# Patient Record
Sex: Female | Born: 1955
Health system: Southern US, Community
[De-identification: ages and names within clinical notes are randomized; demographics above are authoritative.]

## PROBLEM LIST (undated history)

## (undated) DIAGNOSIS — R1310 Dysphagia, unspecified: Secondary | ICD-10-CM

## (undated) DIAGNOSIS — E039 Hypothyroidism, unspecified: Secondary | ICD-10-CM

## (undated) DIAGNOSIS — B450 Pulmonary cryptococcosis: Secondary | ICD-10-CM

## (undated) DIAGNOSIS — K5090 Crohn's disease, unspecified, without complications: Secondary | ICD-10-CM

## (undated) DIAGNOSIS — K219 Gastro-esophageal reflux disease without esophagitis: Secondary | ICD-10-CM

## (undated) DIAGNOSIS — M1990 Unspecified osteoarthritis, unspecified site: Secondary | ICD-10-CM

## (undated) DIAGNOSIS — F419 Anxiety disorder, unspecified: Secondary | ICD-10-CM

## (undated) HISTORY — PX: CHOLECYSTECTOMY: SHX55

## (undated) HISTORY — PX: SMALL INTESTINE SURGERY: SHX150

## (undated) HISTORY — DX: Anxiety disorder, unspecified: F41.9

## (undated) HISTORY — DX: Dysphagia, unspecified: R13.10

## (undated) HISTORY — PX: APPENDECTOMY: SHX54

## (undated) HISTORY — DX: Pulmonary cryptococcosis: B45.0

## (undated) HISTORY — DX: Hypothyroidism, unspecified: E03.9

## (undated) HISTORY — DX: Crohn's disease, unspecified, without complications: K50.90

---

## 1979-11-13 HISTORY — PX: PARTIAL COLECTOMY: SHX5273

## 1998-11-12 HISTORY — PX: BUNIONECTOMY: SHX129

## 2003-11-13 HISTORY — PX: BUNIONECTOMY: SHX129

## 2008-11-12 HISTORY — PX: COLONOSCOPY: SHX174

## 2008-11-12 HISTORY — PX: ESOPHAGOGASTRODUODENOSCOPY: SHX1529

## 2015-05-02 DIAGNOSIS — E539 Vitamin B deficiency, unspecified: Secondary | ICD-10-CM | POA: Diagnosis not present

## 2015-05-19 DIAGNOSIS — Z780 Asymptomatic menopausal state: Secondary | ICD-10-CM | POA: Diagnosis not present

## 2015-05-19 DIAGNOSIS — Z01419 Encounter for gynecological examination (general) (routine) without abnormal findings: Secondary | ICD-10-CM | POA: Diagnosis not present

## 2015-05-19 DIAGNOSIS — Z6827 Body mass index (BMI) 27.0-27.9, adult: Secondary | ICD-10-CM | POA: Diagnosis not present

## 2015-05-19 DIAGNOSIS — N952 Postmenopausal atrophic vaginitis: Secondary | ICD-10-CM | POA: Diagnosis not present

## 2015-05-19 DIAGNOSIS — N904 Leukoplakia of vulva: Secondary | ICD-10-CM | POA: Diagnosis not present

## 2015-06-02 DIAGNOSIS — E539 Vitamin B deficiency, unspecified: Secondary | ICD-10-CM | POA: Diagnosis not present

## 2015-06-28 DIAGNOSIS — E539 Vitamin B deficiency, unspecified: Secondary | ICD-10-CM | POA: Diagnosis not present

## 2015-07-29 DIAGNOSIS — E539 Vitamin B deficiency, unspecified: Secondary | ICD-10-CM | POA: Diagnosis not present

## 2015-07-29 DIAGNOSIS — Z23 Encounter for immunization: Secondary | ICD-10-CM | POA: Diagnosis not present

## 2015-08-03 DIAGNOSIS — B450 Pulmonary cryptococcosis: Secondary | ICD-10-CM | POA: Diagnosis not present

## 2015-08-03 DIAGNOSIS — K5000 Crohn's disease of small intestine without complications: Secondary | ICD-10-CM | POA: Diagnosis not present

## 2015-08-03 DIAGNOSIS — K50019 Crohn's disease of small intestine with unspecified complications: Secondary | ICD-10-CM | POA: Diagnosis not present

## 2015-08-03 DIAGNOSIS — R918 Other nonspecific abnormal finding of lung field: Secondary | ICD-10-CM | POA: Diagnosis not present

## 2015-08-03 DIAGNOSIS — K219 Gastro-esophageal reflux disease without esophagitis: Secondary | ICD-10-CM | POA: Diagnosis not present

## 2015-08-09 DIAGNOSIS — Z885 Allergy status to narcotic agent status: Secondary | ICD-10-CM | POA: Diagnosis not present

## 2015-08-09 DIAGNOSIS — Z882 Allergy status to sulfonamides status: Secondary | ICD-10-CM | POA: Diagnosis not present

## 2015-08-09 DIAGNOSIS — F410 Panic disorder [episodic paroxysmal anxiety] without agoraphobia: Secondary | ICD-10-CM | POA: Diagnosis not present

## 2015-08-09 DIAGNOSIS — Z888 Allergy status to other drugs, medicaments and biological substances status: Secondary | ICD-10-CM | POA: Diagnosis not present

## 2015-08-09 DIAGNOSIS — R000 Tachycardia, unspecified: Secondary | ICD-10-CM | POA: Diagnosis not present

## 2015-08-09 DIAGNOSIS — Z7952 Long term (current) use of systemic steroids: Secondary | ICD-10-CM | POA: Diagnosis not present

## 2015-08-09 DIAGNOSIS — F419 Anxiety disorder, unspecified: Secondary | ICD-10-CM | POA: Diagnosis not present

## 2015-08-09 DIAGNOSIS — F17201 Nicotine dependence, unspecified, in remission: Secondary | ICD-10-CM | POA: Diagnosis not present

## 2015-08-09 DIAGNOSIS — Z0189 Encounter for other specified special examinations: Secondary | ICD-10-CM | POA: Diagnosis not present

## 2015-08-09 DIAGNOSIS — R079 Chest pain, unspecified: Secondary | ICD-10-CM | POA: Diagnosis not present

## 2015-08-09 DIAGNOSIS — R002 Palpitations: Secondary | ICD-10-CM | POA: Diagnosis not present

## 2015-08-09 DIAGNOSIS — K5090 Crohn's disease, unspecified, without complications: Secondary | ICD-10-CM | POA: Diagnosis not present

## 2015-08-30 DIAGNOSIS — E539 Vitamin B deficiency, unspecified: Secondary | ICD-10-CM | POA: Diagnosis not present

## 2015-09-27 DIAGNOSIS — E539 Vitamin B deficiency, unspecified: Secondary | ICD-10-CM | POA: Diagnosis not present

## 2015-10-27 DIAGNOSIS — E539 Vitamin B deficiency, unspecified: Secondary | ICD-10-CM | POA: Diagnosis not present

## 2016-10-31 ENCOUNTER — Encounter: Payer: Self-pay | Admitting: Gastroenterology

## 2016-11-01 ENCOUNTER — Telehealth: Payer: Self-pay

## 2016-11-01 NOTE — Telephone Encounter (Signed)
Pt received a triage letter from DS. Pt is wanting to have colonoscopy next week since her husband (truck driver) will be home to take her. Pt also was referred by Dr Quintin Alto for reflux and crohn's and has OV with [place] on 1/11 at 230pm.  Patient said she wanted to speak with DS and would call back. [phone]

## 2016-11-02 NOTE — Telephone Encounter (Signed)
NOTE: [phone] is incorrect number.  I called pt at [phone]. She has been referred to [place] by Dr. Quintin Alto for gerd and crohn's.  She was already scheduled for 11/22/2016 and I told her it would be best to keep that appt since she was referred for problems.  She wanted to change the appt to an earlier am appt. She was rescheduled to 11/26/2016 at 8:30 Am with Neil Crouch, PA.

## 2016-11-22 ENCOUNTER — Ambulatory Visit: Payer: Self-pay | Admitting: Gastroenterology

## 2016-11-26 ENCOUNTER — Ambulatory Visit: Payer: Self-pay | Admitting: Gastroenterology

## 2016-12-14 ENCOUNTER — Ambulatory Visit: Payer: Self-pay | Admitting: Gastroenterology

## 2017-01-03 ENCOUNTER — Other Ambulatory Visit: Payer: Self-pay

## 2017-01-03 ENCOUNTER — Telehealth: Payer: Self-pay

## 2017-01-03 ENCOUNTER — Encounter: Payer: Self-pay | Admitting: Nurse Practitioner

## 2017-01-03 ENCOUNTER — Ambulatory Visit (INDEPENDENT_AMBULATORY_CARE_PROVIDER_SITE_OTHER): Payer: Medicare Other | Admitting: Nurse Practitioner

## 2017-01-03 VITALS — BP 129/77 | HR 87 | Temp 98.1°F | Ht 66.0 in | Wt 172.6 lb

## 2017-01-03 DIAGNOSIS — K50119 Crohn's disease of large intestine with unspecified complications: Secondary | ICD-10-CM | POA: Diagnosis not present

## 2017-01-03 DIAGNOSIS — B459 Cryptococcosis, unspecified: Secondary | ICD-10-CM

## 2017-01-03 DIAGNOSIS — K50019 Crohn's disease of small intestine with unspecified complications: Secondary | ICD-10-CM | POA: Insufficient documentation

## 2017-01-03 MED ORDER — PEG 3350-KCL-NA BICARB-NACL 420 G PO SOLR: 4000.0000 mL | ORAL | 0 refills | Status: DC

## 2017-01-03 NOTE — Telephone Encounter (Signed)
Spoke to [place] at Dr. Luan Pulling (pulmonologist) office. She advised to fax referral info to [phone]. Info faxed for dx: cryptococcosis.

## 2017-01-03 NOTE — Progress Notes (Signed)
cc'ed to pcp °

## 2017-01-03 NOTE — Patient Instructions (Signed)
No PA needed for TCS, Decision ID# [id number]

## 2017-01-03 NOTE — Patient Instructions (Signed)
1. Have your labs drawn when you're able to. 2. We will schedule your colonoscopy for you. 3. We will refer you to Dr. Luan Pulling who is a pulmonologist to evaluate your lung condition. 4. Return for follow-up in 6 months or per postprocedure recommendations.

## 2017-01-03 NOTE — Assessment & Plan Note (Signed)
The patient has a history of cryptococcosis which is being followed at [facility] in tandem with her Crohn's disease care on Imuran and prednisone. Last x-ray reviewed by pulmonary and found to be no sign of reactivation. At this point I will refer her to Dr. Luan Pulling was a pulmonologist for evaluation and recommendations/follow-up care related to this.

## 2017-01-03 NOTE — Progress Notes (Addendum)
REVIEWED-NO ADDITIONAL RECOMMENDATIONS.  Primary Care Physician:  Manon Hilding, MD Primary Gastroenterologist:  Dr. Oneida Alar  Chief Complaint  Patient presents with  . Crohn's Disease    HPI:   Isabella Scott is a 61 y.o. female who presents On referral from primary care for GERD and Crohn's disease history. Patient was going to see Dr. Britta Mccreedy but he has left his practice. It appears she is extensively seen [facility] for GI care including GERD and Crohn's disease. PCP notes reviewed including attached labs. CBC completed 07/03/2016 with normal hemoglobin, no leukocytosis, normal AST/ALT, alkaline phosphatase, bilirubin. Her PCP last colonoscopy 2014 at her visit on 09/04/2016 she requested transfer of her care from [facility] to Dr. Karena Addison. Per the patient due for colonoscopy in 2018. Last saw Dr. Britta Mccreedy 07/24/2012 for Crohn's disease. EGD completed by Dr. Britta Mccreedy 07/28/2012 noted mild gastritis in the antrum, hiatal hernia, stricture in the distal esophagus. Recommended eats slowly and chew food well, Nexium once a day.  Last saw [facility] for Crohn's disease on 08/03/2015 with a diagnosis of Crohn's disease a small intestine without complication and GERD. At that time she was noted to be doing reasonably well from a Crohn's disease standpoint with no abdominal pain or bleeding. No fever, cough, dyspnea, malaise. Last colonoscopy 03/24/2013 with a noted 5 cm segment of active Crohn's disease at the ileocolic anastomosis. She was also followed due to her pulmonary cryptococcosis which was deemed to not be reactivated. Lab orders include CBC, CRP, CMP, vitamin D, and chest x-ray. Recommended 1 g daily of calcium and bone scan to her primary care. Recommended CBC every 3 months locally. [facility] recommended repeat chest x-ray next year which they would handle. Lab results show normal CRP, vitamin D insufficiency, CMP essentially normal. At that time she was  noted be on Prilosec 40 mg daily, Imuran 50 mg 1-1/2 tablets daily although she was only taking 1 tablet daily. She was also on prednisone 4 mg.  Today she states she transferred from [facility] because her GI there was retiring and she didn't want to drive up there anymore and preferred someone closer to home. Overall she is feeling well. Last colonoscopy was at [facility] by Dr. Britta Mccreedy, unsure of the date. Unsure of when she is due for repeat exam. She is still on Immuran 50 mg daily and Prednisone 4 mg daily, she has been on this "for a while." She was first diagnosed in 1979 and has had removal of her terminal ileum. Denies abdominal pain other than some sensitivity at the surgical site chonically. Has a bowel movement about twice a day, no loose stools (on Questran). Deneis hematochezia, N/V, melena. GERD symptoms well controlled on PPI and dietary changes. Denies chest pain, dyspnea, dizziness, lightheadedness, syncope, near syncope. Denies any other upper or lower GI symptoms.  States nobody has been following her inactive lung infection since being seen at [facility]. Has not had a follow-up XRay since 2016. This was being followed regularly by [facility].   Past Medical History:  Diagnosis Date  . Anxiety   . Crohn disease ([place])   . Dysphagia   . Pulmonary cryptococcosis [facility])     Past Surgical History:  Procedure Laterality Date  . BUNIONECTOMY Left 2000  . BUNIONECTOMY Right 2005  . COLONOSCOPY N/A 2010  . ESOPHAGOGASTRODUODENOSCOPY N/A 2010   with dilation  . PARTIAL COLECTOMY N/A 1981   terminal ileum resection    Current Outpatient Prescriptions  Medication  Sig Dispense Refill  . azaTHIOprine (IMURAN) 50 MG tablet Take 50 mg by mouth daily.    . calcium carbonate (CALCIUM 600) 600 MG TABS tablet Take 600 mg by mouth daily with breakfast.    . citalopram (CELEXA) 40 MG tablet Take 40 mg by mouth daily.    . Cyanocobalamin (VITAMIN B-12 IJ) Inject as directed every 30 (thirty) days.      Marland Kitchen esomeprazole (NEXIUM) 40 MG capsule Take 40 mg by mouth daily at 12 noon.    Marland Kitchen LORazepam (ATIVAN) 0.5 MG tablet Take 0.5 mg by mouth at bedtime as needed for anxiety.    . metoprolol succinate (TOPROL-XL) 25 MG 24 hr tablet Take 25 mg by mouth daily.    . Multiple Vitamin (MULTIVITAMIN) capsule Take 1 capsule by mouth daily.    . potassium chloride (K-DUR,KLOR-CON) 10 MEQ tablet Take 10 mEq by mouth 2 (two) times daily.    . predniSONE (DELTASONE) 1 MG tablet Take 4 mg by mouth daily with breakfast.    . polyethylene glycol-electrolytes (TRILYTE) 420 g solution Take 4,000 mLs by mouth as directed. 4000 mL 0   No current facility-administered medications for this visit.     Allergies as of 01/03/2017 - Review Complete 01/03/2017  Allergen Reaction Noted  . Meperidine  06/12/2011  . Morphine and related  06/12/2011  . Sulfamethoxazole Rash 08/13/2012    Family History  Problem Relation Age of Onset  . Colon cancer Neg Hx   . Crohn's disease Neg Hx   . Ulcerative colitis Neg Hx     Social History   Social History  . Marital status: Married    Spouse name: N/A  . Number of children: N/A  . Years of education: N/A   Occupational History  . Not on file.   Social History Main Topics  . Smoking status: Former Smoker    Types: Cigarettes    Quit date: 01/03/1997  . Smokeless tobacco: Never Used  . Alcohol use No  . Drug use: No  . Sexual activity: Not on file   Other Topics Concern  . Not on file   Social History Narrative  . No narrative on file    Review of Systems: Complete ROS negative except as per HPI.    Physical Exam: BP 129/77   Pulse 87   Temp 98.1 F (36.7 C) (Oral)   Ht 5' 6"  (1.676 m)   Wt 172 lb 9.6 oz (78.3 kg)   BMI 27.86 kg/m  General:   Alert and oriented. Pleasant and cooperative. Well-nourished and well-developed.  Head:  Normocephalic and atraumatic. Eyes:  Without icterus, sclera clear and conjunctiva pink.  Ears:  Normal auditory  acuity. Cardiovascular:  S1, S2 present without murmurs appreciated. Extremities without clubbing or edema. Respiratory:  Clear to auscultation bilaterally. No wheezes, rales, or rhonchi. No distress.  Gastrointestinal:  +BS, soft, and non-distended. Mild TTP at previous surgical site. No HSM noted. No guarding or rebound. No masses appreciated.  Rectal:  Deferred  Musculoskalatal:  Symmetrical without gross deformities. Skin:  Right-sided anterior abdominal incisional scar noted. Neurologic:  Alert and oriented x4;  grossly normal neurologically. Psych:  Alert and cooperative. Normal mood and affect. Heme/Lymph/Immune: No excessive bruising noted.    01/03/2017 3:50 PM   Disclaimer: This note was dictated with voice recognition software. Similar sounding words can inadvertently be transcribed and may not be corrected upon review.

## 2017-01-03 NOTE — Assessment & Plan Note (Signed)
The patient has a long-standing history of Crohn's disease (since 36). Her GI at [facility] has recently retired and she does not want to drive to once in [place] anymore. She was referred to [place] for follow-up. She has had surgical resection of her terminal ileum due to Crohn's disease. She has been maintained for quite some time on Imuran 50 mg daily and prednisone 4 mg daily. She is doing pretty well on this with no overt symptoms of Crohn's disease exacerbation. She thinks her last colonoscopy was in [facility]. However, they have no record of a colonoscopy or path report on this patient. At this point her PCP feels she is due for a colonoscopy so we will proceed with scheduling. I will also check a vitamin D level as she was previously insufficient along with CBC, CMP, CRP. Return for follow-up in 6 months or based on postprocedure recommendations.  Proceed with colonoscopy with 12.5 mg preprocedure Phenergan with Dr. Oneida Alar in the near future. The risks, benefits, and alternatives have been discussed in detail with the patient. They state understanding and desire to proceed.   The patient is on Celexa 40 mg daily, Ativan 0.5 mg at bedtime as needed. No other anticoagulants, anxiolytics, chronic pain medications, or antidepressants. We will add 12.5 mg preprocedure Phenergan to promote adequate sedation.

## 2017-01-07 NOTE — Telephone Encounter (Signed)
Pt called office. Informed of appt with Dr. Luan Pulling. She wants to change appt. Gave her their office number for her to call.

## 2017-01-07 NOTE — Telephone Encounter (Signed)
Received fax from Dr. Luan Pulling' office. Pt has appt 01/29/17 at 4:00pm. LMOVM and LMOAM for pt to call office to be informed.

## 2017-01-17 LAB — COMPREHENSIVE METABOLIC PANEL
ALBUMIN: 4 g/dL (ref 3.6–5.1)
ALT: 9 U/L (ref 6–29)
AST: 17 U/L (ref 10–35)
Alkaline Phosphatase: 82 U/L (ref 33–130)
BUN: 6 mg/dL — ABNORMAL LOW (ref 7–25)
CALCIUM: 9.4 mg/dL (ref 8.6–10.4)
CO2: 24 mmol/L (ref 20–31)
Chloride: 105 mmol/L (ref 98–110)
Creat: 0.88 mg/dL (ref 0.50–1.05)
GLUCOSE: 72 mg/dL (ref 65–99)
POTASSIUM: 4.3 mmol/L (ref 3.5–5.3)
Sodium: 143 mmol/L (ref 135–146)
Total Bilirubin: 0.2 mg/dL (ref 0.2–1.2)
Total Protein: 7.4 g/dL (ref 6.1–8.1)

## 2017-01-17 LAB — CBC WITH DIFFERENTIAL/PLATELET
Basophils Absolute: 0 cells/uL (ref 0–200)
Basophils Relative: 0 %
EOS PCT: 2 %
Eosinophils Absolute: 132 cells/uL (ref 15–500)
HEMATOCRIT: 41.4 % (ref 35.0–45.0)
Hemoglobin: 13.8 g/dL (ref 11.7–15.5)
LYMPHS PCT: 29 %
Lymphs Abs: 1914 cells/uL (ref 850–3900)
MCH: 31.2 pg (ref 27.0–33.0)
MCHC: 33.3 g/dL (ref 32.0–36.0)
MCV: 93.5 fL (ref 80.0–100.0)
MONOS PCT: 6 %
MPV: 9.9 fL (ref 7.5–12.5)
Monocytes Absolute: 396 cells/uL (ref 200–950)
NEUTROS PCT: 63 %
Neutro Abs: 4158 cells/uL (ref 1500–7800)
Platelets: 227 10*3/uL (ref 140–400)
RBC: 4.43 MIL/uL (ref 3.80–5.10)
RDW: 14.7 % (ref 11.0–15.0)
WBC: 6.6 10*3/uL (ref 3.8–10.8)

## 2017-01-17 LAB — VITAMIN D 25 HYDROXY (VIT D DEFICIENCY, FRACTURES): Vit D, 25-Hydroxy: 27 ng/mL — ABNORMAL LOW (ref 30–100)

## 2017-01-17 LAB — C-REACTIVE PROTEIN: CRP: 0.9 mg/L (ref <8.0)

## 2017-01-28 ENCOUNTER — Encounter (HOSPITAL_COMMUNITY): Payer: Self-pay | Admitting: *Deleted

## 2017-01-28 ENCOUNTER — Ambulatory Visit (HOSPITAL_COMMUNITY)
Admission: RE | Admit: 2017-01-28 | Discharge: 2017-01-28 | Disposition: A | Discharge status: Home / Self Care | Payer: Medicare Other | Source: Ambulatory Visit | Attending: Gastroenterology | Admitting: Gastroenterology

## 2017-01-28 ENCOUNTER — Encounter (HOSPITAL_COMMUNITY)
Admission: RE | Disposition: A | Discharge status: Home / Self Care | Payer: Self-pay | Source: Ambulatory Visit | Attending: Gastroenterology

## 2017-01-28 DIAGNOSIS — Z9049 Acquired absence of other specified parts of digestive tract: Secondary | ICD-10-CM | POA: Insufficient documentation

## 2017-01-28 DIAGNOSIS — Z800 Family history of malignant neoplasm of digestive organs: Secondary | ICD-10-CM | POA: Diagnosis not present

## 2017-01-28 DIAGNOSIS — K648 Other hemorrhoids: Secondary | ICD-10-CM | POA: Diagnosis not present

## 2017-01-28 DIAGNOSIS — Z79899 Other long term (current) drug therapy: Secondary | ICD-10-CM | POA: Diagnosis not present

## 2017-01-28 DIAGNOSIS — F419 Anxiety disorder, unspecified: Secondary | ICD-10-CM | POA: Insufficient documentation

## 2017-01-28 DIAGNOSIS — Z1211 Encounter for screening for malignant neoplasm of colon: Secondary | ICD-10-CM | POA: Diagnosis not present

## 2017-01-28 DIAGNOSIS — K5090 Crohn's disease, unspecified, without complications: Secondary | ICD-10-CM | POA: Diagnosis not present

## 2017-01-28 DIAGNOSIS — Z7952 Long term (current) use of systemic steroids: Secondary | ICD-10-CM | POA: Insufficient documentation

## 2017-01-28 DIAGNOSIS — K219 Gastro-esophageal reflux disease without esophagitis: Secondary | ICD-10-CM | POA: Insufficient documentation

## 2017-01-28 DIAGNOSIS — K529 Noninfective gastroenteritis and colitis, unspecified: Secondary | ICD-10-CM | POA: Insufficient documentation

## 2017-01-28 DIAGNOSIS — K6389 Other specified diseases of intestine: Secondary | ICD-10-CM | POA: Insufficient documentation

## 2017-01-28 DIAGNOSIS — Z87891 Personal history of nicotine dependence: Secondary | ICD-10-CM | POA: Diagnosis not present

## 2017-01-28 DIAGNOSIS — K50119 Crohn's disease of large intestine with unspecified complications: Secondary | ICD-10-CM | POA: Diagnosis not present

## 2017-01-28 DIAGNOSIS — Z1212 Encounter for screening for malignant neoplasm of rectum: Secondary | ICD-10-CM | POA: Diagnosis not present

## 2017-01-28 HISTORY — DX: Unspecified osteoarthritis, unspecified site: M19.90

## 2017-01-28 HISTORY — DX: Gastro-esophageal reflux disease without esophagitis: K21.9

## 2017-01-28 HISTORY — PX: COLONOSCOPY: SHX5424

## 2017-01-28 SURGERY — COLONOSCOPY
Anesthesia: Moderate Sedation

## 2017-01-28 MED ORDER — SODIUM CHLORIDE 0.9% FLUSH
INTRAVENOUS | Status: AC
  Filled 2017-01-28: qty 10

## 2017-01-28 MED ORDER — MIDAZOLAM HCL 5 MG/5ML IJ SOLN
INTRAMUSCULAR | Status: DC | PRN
  Administered 2017-01-28: 2 mg via INTRAVENOUS
  Administered 2017-01-28 (×2): 1 mg via INTRAVENOUS
  Administered 2017-01-28: 2 mg via INTRAVENOUS
  Administered 2017-01-28 (×2): 1 mg via INTRAVENOUS

## 2017-01-28 MED ORDER — PROMETHAZINE HCL 25 MG/ML IJ SOLN
INTRAMUSCULAR | Status: AC
  Filled 2017-01-28: qty 1

## 2017-01-28 MED ORDER — SODIUM CHLORIDE 0.9 % IV SOLN
INTRAVENOUS | Status: DC
  Administered 2017-01-28: 09:00:00 via INTRAVENOUS

## 2017-01-28 MED ORDER — FENTANYL CITRATE (PF) 100 MCG/2ML IJ SOLN
INTRAMUSCULAR | Status: AC
  Filled 2017-01-28: qty 2

## 2017-01-28 MED ORDER — MIDAZOLAM HCL 5 MG/5ML IJ SOLN
INTRAMUSCULAR | Status: AC
  Filled 2017-01-28: qty 10

## 2017-01-28 MED ORDER — MEPERIDINE HCL 100 MG/ML IJ SOLN
INTRAMUSCULAR | Status: AC
  Filled 2017-01-28: qty 2

## 2017-01-28 MED ORDER — FENTANYL CITRATE (PF) 100 MCG/2ML IJ SOLN
INTRAMUSCULAR | Status: DC | PRN
  Administered 2017-01-28: 50 ug via INTRAVENOUS
  Administered 2017-01-28 (×2): 25 ug via INTRAVENOUS

## 2017-01-28 MED ORDER — PROMETHAZINE HCL 25 MG/ML IJ SOLN
12.5000 mg | Freq: Once | INTRAMUSCULAR | Status: AC
  Administered 2017-01-28: 12.5 mg via INTRAVENOUS

## 2017-01-28 NOTE — OR Nursing (Signed)
At anastomosis at 1015

## 2017-01-28 NOTE — H&P (Signed)
Primary Care Physician:  Manon Hilding, MD Primary Gastroenterologist:  Dr. Oneida Alar  Pre-Procedure History & Physical: HPI:  Isabella Scott is a 61 y.o. female here for  [place].  Past Medical History:  Diagnosis Date  . Anxiety   . Arthritis   . Crohn disease ([place])   . Dysphagia   . GERD (gastroesophageal reflux disease)   . Pulmonary cryptococcosis [facility])     Past Surgical History:  Procedure Laterality Date  . BUNIONECTOMY Left 2000  . BUNIONECTOMY Right 2005  . COLONOSCOPY N/A 2010  . ESOPHAGOGASTRODUODENOSCOPY N/A 2010   with dilation  . PARTIAL COLECTOMY N/A 1981   terminal ileum resection    Prior to Admission medications   Medication Sig Start Date End Date Taking? Authorizing Provider  azaTHIOprine (IMURAN) 50 MG tablet Take 50 mg by mouth every evening.    Yes Historical Provider, MD  Calcium Carb-Cholecalciferol (CALCIUM 600+D) 600-800 MG-UNIT TABS Take 1 tablet by mouth daily.   Yes Historical Provider, MD  cetirizine (ZYRTEC) 10 MG tablet Take 10 mg by mouth daily as needed for allergies.   Yes Historical Provider, MD  cholestyramine light (PREVALITE) 4 GM/DOSE powder Take 1 packet by mouth daily as needed for diarrhea or loose stools. 01/16/17  Yes Historical Provider, MD  citalopram (CELEXA) 20 MG tablet Take 40 mg by mouth daily. 01/22/17  Yes Historical Provider, MD  Cyanocobalamin (VITAMIN B-12 IJ) Inject 1 Dose as directed every 30 (thirty) days.    Yes Historical Provider, MD  esomeprazole (NEXIUM) 40 MG capsule Take 40 mg by mouth daily as needed (indigestion).    Yes Historical Provider, MD  metoprolol tartrate (LOPRESSOR) 25 MG tablet Take 25 mg by mouth daily.    Yes Historical Provider, MD  Multiple Vitamin (MULTIVITAMIN) capsule Take 1 capsule by mouth daily.   Yes Historical Provider, MD  Naphazoline-Pheniramine (OPCON-A OP) Apply 1 drop to eye daily as needed (allergies).   Yes Historical Provider, MD  polyethylene glycol-electrolytes  (TRILYTE) 420 g solution Take 4,000 mLs by mouth as directed. 01/03/17  Yes Danie Binder, MD  potassium chloride (K-DUR,KLOR-CON) 10 MEQ tablet Take 10 mEq by mouth 2 (two) times daily.   Yes Historical Provider, MD  predniSONE (DELTASONE) 1 MG tablet Take 4 mg by mouth daily with breakfast.   Yes Historical Provider, MD  Tetrahydrozoline HCl (VISINE OP) Apply 1 drop to eye daily as needed (dry eyes).   Yes Historical Provider, MD  acetaminophen (TYLENOL) 500 MG tablet Take 500-1,000 mg by mouth daily as needed for moderate pain or headache.    Historical Provider, MD  LORazepam (ATIVAN) 0.5 MG tablet Take 0.5 mg by mouth 2 (two) times daily as needed for anxiety.     Historical Provider, MD    Allergies as of 01/03/2017 - Review Complete 01/03/2017  Allergen Reaction Noted  . Meperidine  06/12/2011  . Morphine and related  06/12/2011  . Sulfamethoxazole Rash 08/13/2012    Family History  Problem Relation Age of Onset  . Heart disease Father   . Heart disease Brother   . Kidney disease Brother   . Throat cancer Maternal Uncle   . Lung cancer Maternal Grandfather   . Colon cancer Neg Hx   . Crohn's disease Neg Hx   . Ulcerative colitis Neg Hx     Social History   Social History  . Marital status: Married    Spouse name: N/A  . Number of children: N/A  . Years  of education: N/A   Occupational History  . Not on file.   Social History Main Topics  . Smoking status: Former Smoker    Types: Cigarettes    Quit date: 01/03/1997  . Smokeless tobacco: Never Used  . Alcohol use No  . Drug use: No  . Sexual activity: Not on file   Other Topics Concern  . Not on file   Social History Narrative  . No narrative on file    Review of Systems: See HPI, otherwise negative ROS   Physical Exam: BP 117/76   Pulse 85   Temp 98.5 F (36.9 C) (Oral)   Resp 15   Ht 5' 6"  (1.676 m)   Wt 172 lb (78 kg)   SpO2 98%   BMI 27.76 kg/m  General:   Alert,  pleasant and cooperative  in NAD Head:  Normocephalic and atraumatic. Neck:  Supple; Lungs:  Clear throughout to auscultation.    Heart:  Regular rate and rhythm. Abdomen:  Soft, nontender and nondistended. Normal bowel sounds, without guarding, and without rebound.   Neurologic:  Alert and  oriented x4;  grossly normal neurologically.  Impression/Plan:     SCREENING  Plan:  1. TCS TODAY. DISCUSSED PROCEDURE, BENEFITS, & RISKS: < 1% chance of medication reaction, bleeding, perforation, or rupture of spleen/liver.

## 2017-01-28 NOTE — Op Note (Signed)
[facility] Patient Name: Isabella Scott Procedure Date: 01/28/2017 9:37 AM MRN: [id number] Date of Birth: 1956/05/27 Attending MD: Barney Drain , MD CSN: [id number] Age: 61 Admit Type: Outpatient Procedure:                Colonoscopy WITH COLD FORCEPS BIOPSY Indications:              Colon cancer screening in patient at increased                            risk: Family history of colorectal cancer in                            multiple 2nd degree relatives Providers:                Barney Drain, MD, Janeece Riggers, RN, Rosina Lowenstein, RN Referring MD:             Manon Hilding MD, MD Medicines:                Promethazine 12.5 mg IV, Fentanyl 100 micrograms                            IV, Midazolam 8 mg IV Complications:            No immediate complications. Estimated Blood Loss:     Estimated blood loss was minimal. Procedure:                Pre-Anesthesia Assessment:                           - Prior to the procedure, a History and Physical                            was performed, and patient medications and                            allergies were reviewed. The patient's tolerance of                            previous anesthesia was also reviewed. The risks                            and benefits of the procedure and the sedation                            options and risks were discussed with the patient.                            All questions were answered, and informed consent                            was obtained. Prior Anticoagulants: The patient has                            taken no previous anticoagulant or antiplatelet  agents. ASA Grade Assessment: II - A patient with                            mild systemic disease. After reviewing the risks                            and benefits, the patient was deemed in                            satisfactory condition to undergo the procedure.                            After obtaining informed  consent, the colonoscope                            was passed under direct vision. Throughout the                            procedure, the patient's blood pressure, pulse, and                            oxygen saturations were monitored continuously. The                            [id number] ([id number]) scope was introduced through                            the anus and advanced to the 7 cm into the ileum.                            The colonoscopy was technically difficult and                            complex due to significant looping and the                            patient's agitation. Successful completion of the                            procedure was aided by increasing the dose of                            sedation medication, changing the patient to a                            supine position, straightening and shortening the                            scope to obtain bowel loop reduction, applying                            abdominal pressure and COLOWRAP. The patient  tolerated the procedure fairly well. The quality of                            the bowel preparation was good. The terminal ileum                            and the rectum were photographed. Scope In: 10:07:56 AM Scope Out: 45:80:99 AM Scope Withdrawal Time: 0 hours 30 minutes 24 seconds  Total Procedure Duration: 0 hours 38 minutes 17 seconds  Findings:      Patchy inflammation, mild in severity and characterized by erythema was       found in the neo-terminal Ileum. This was biopsied with a cold forceps       for histology.      The recto-sigmoid colon, sigmoid colon and descending colon revealed       significantly excessive looping. This was biopsied with a cold forceps       for evaluation of inflammatory bowel disease.      Internal hemorrhoids were found during retroflexion. The hemorrhoids       were small. Impression:               - Ileitis. Biopsied.                            - There was significant looping of the colon.                           - Internal hemorrhoids. Moderate Sedation:      Moderate (conscious) sedation was administered by the endoscopy nurse       and supervised by the endoscopist. The following parameters were       monitored: oxygen saturation, heart rate, blood pressure, and response       to care. Total physician intraservice time was 57 minutes. Recommendation:           - Repeat colonoscopy in 5 years for surveillance                            WITH MAC/OVERTUBE/ULTRASLIM COLONOSCOPE-NO CUFF.                           - High fiber diet.                           - Continue present medications.                           - Await pathology results.                           - Patient has a contact number available for                            emergencies. The signs and symptoms of potential                            delayed complications were discussed with the  patient. Return to normal activities tomorrow.                            Written discharge instructions were provided to the                            patient. Procedure Code(s):        --- Professional ---                           [phone], Colonoscopy, flexible; with biopsy, single                            or multiple                           99152, Moderate sedation services provided by the                            same physician or other qualified health care                            professional performing the diagnostic or                            therapeutic service that the sedation supports,                            requiring the presence of an independent trained                            observer to assist in the monitoring of the                            patient's level of consciousness and physiological                            status; initial 15 minutes of intraservice time,                            patient  age 51 years or older                           [phone], Moderate sedation services; each additional                            15 minutes intraservice time                           99153, Moderate sedation services; each additional                            15 minutes intraservice time                           99153, Moderate sedation services; each  additional                            15 minutes intraservice time Diagnosis Code(s):        --- Professional ---                           Z80.0, Family history of malignant neoplasm of                            digestive organs                           K52.9, Noninfective gastroenteritis and colitis,                            unspecified                           K64.8, Other hemorrhoids CPT copyright 2016 [employer] Medical [employer]. All rights reserved. The codes documented in this report are preliminary and upon coder review may  be revised to meet current compliance requirements. Barney Drain, MD Barney Drain, MD 01/28/2017 11:09:35 AM This report has been signed electronically. Number of Addenda: 0

## 2017-01-28 NOTE — Discharge Instructions (Signed)
You have internal hemorrhoids. YOU had a polypoid lesion at YOUR ANASTOMOSIS. YOU HAD VERY MILD ILEITIS IN THE SMALL BOWEL BUT MOST OF IT LOOKED NORMAL. YOUR ANASTOMOSIS IS SMALL. I BIOPSIED YOUR SMALL BOWEL AND COLON.   DRINK WATER TO KEEP YOUR URINE LIGHT YELLOW.  USE FIBER POWDER ONCE DAILY. AVOID HIGHER DOSES IF IT CAUSES ABDOMINAL PAIN, BLOATING & GAS.  USE PREPARATION H FOUR TIMES  A DAY IF NEEDED TO RELIEVE RECTAL PAIN/PRESSURE/BLEEDING.  YOUR BIOPSY RESULTS WILL BE AVAILABLE IN MY CHART AFTER MAR 22 AND MY OFFICE WILL CONTACT YOU IN 10-14 DAYS WITH YOUR RESULTS.   Next colonoscopy in 5 years.  Colonoscopy Care After Read the instructions outlined below and refer to this sheet in the next week. These discharge instructions provide you with general information on caring for yourself after you leave the hospital. While your treatment has been planned according to the most current medical practices available, unavoidable complications occasionally occur. If you have any problems or questions after discharge, call DR. Donielle Kaigler, [phone].  ACTIVITY  You may resume your regular activity, but move at a slower pace for the next 24 hours.   Take frequent rest periods for the next 24 hours.   Walking will help get rid of the air and reduce the bloated feeling in your belly (abdomen).   No driving for 24 hours (because of the medicine (anesthesia) used during the test).   You may shower.   Do not sign any important legal documents or operate any machinery for 24 hours (because of the anesthesia used during the test).    NUTRITION  Drink plenty of fluids.   You may resume your normal diet as instructed by your doctor.   Begin with a light meal and progress to your normal diet. Heavy or fried foods are harder to digest and may make you feel sick to your stomach (nauseated).   Avoid alcoholic beverages for 24 hours or as instructed.    MEDICATIONS  You may resume your normal  medications.   WHAT YOU CAN EXPECT TODAY  Some feelings of bloating in the abdomen.   Passage of more gas than usual.   Spotting of blood in your stool or on the toilet paper  .  IF YOU HAD POLYPS REMOVED DURING THE COLONOSCOPY:  Eat a soft diet IF YOU HAVE NAUSEA, BLOATING, ABDOMINAL PAIN, OR VOMITING.    FINDING OUT THE RESULTS OF YOUR TEST Not all test results are available during your visit. DR. Oneida Alar WILL CALL YOU WITHIN 14 DAYS OF YOUR PROCEDUE WITH YOUR RESULTS. Do not assume everything is normal if you have not heard from DR. Terik Haughey, CALL HER OFFICE AT [phone].  SEEK IMMEDIATE MEDICAL ATTENTION AND CALL THE OFFICE: [phone] IF:  You have more than a spotting of blood in your stool.   Your belly is swollen (abdominal distention).   You are nauseated or vomiting.   You have a temperature over 101F.   You have abdominal pain or discomfort that is severe or gets worse throughout the day.   Hemorrhoids Hemorrhoids are dilated (enlarged) veins around the rectum. Sometimes clots will form in the veins. This makes them swollen and painful. These are called thrombosed hemorrhoids. Causes of hemorrhoids include:  Constipation.   Straining to have a bowel movement.   HEAVY LIFTING  HOME CARE INSTRUCTIONS  Eat a well balanced diet and drink 6 to 8 glasses of water every day to avoid constipation. You may also use a  bulk laxative.   Avoid straining to have bowel movements.   Keep anal area dry and clean.   Do not use a donut shaped pillow or sit on the toilet for long periods. This increases blood pooling and pain.   Move your bowels when your body has the urge; this will require less straining and will decrease pain and pressure.

## 2017-01-30 NOTE — Progress Notes (Signed)
Pt is aware. Routing to Maurice to forward to PCP.

## 2017-01-31 ENCOUNTER — Encounter (HOSPITAL_COMMUNITY): Payer: Self-pay | Admitting: Gastroenterology

## 2017-02-18 ENCOUNTER — Telehealth: Payer: Self-pay | Admitting: Gastroenterology

## 2017-02-18 ENCOUNTER — Encounter: Payer: Self-pay | Admitting: Gastroenterology

## 2017-02-18 NOTE — Telephone Encounter (Signed)
LMOM for a return call. ( [facility] lab order on Dr. Nona Dell desk to complete and sign).

## 2017-02-18 NOTE — Telephone Encounter (Signed)
ON RECALL  °

## 2017-02-18 NOTE — Telephone Encounter (Signed)
PLEASE CALL PT. HER SMALL BOWEL BIOPSIES SHOW MILDLY ACTIVE CROHN'S DISEASE. HER COLON ANN RECTAL BIOPSIES ARE NORMAL. SHE NEEDS HER AZATHIOPRINE LEVELS CHECKED. WE SHOULD TRY TO TAPER OFF THE PREDNISONE ESPECIALLY IN LIGHT OF THE FACT THAT SHE HAD CRYPTOCOCCUS PNEUMONIA.    DRINK WATER TO KEEP YOUR URINE LIGHT YELLOW.  USE FIBER POWDER ONCE DAILY. AVOID HIGHER DOSES IF IT CAUSES ABDOMINAL PAIN, BLOATING & GAS.  USE PREPARATION H FOUR TIMES  A DAY IF NEEDED TO RELIEVE RECTAL PAIN/PRESSURE/BLEEDING.  FOLLOW UP IN 4 MOS E30 CROHN'S DISEASE.   Next colonoscopy in 5 years.

## 2017-02-19 ENCOUNTER — Telehealth: Payer: Self-pay | Admitting: Gastroenterology

## 2017-02-19 ENCOUNTER — Encounter: Payer: Self-pay | Admitting: Gastroenterology

## 2017-02-19 NOTE — Telephone Encounter (Signed)
See results note. Done.

## 2017-02-19 NOTE — Telephone Encounter (Signed)
Pt will come by and pick up lab order and kit for the [facility] lab and take to [facility].

## 2017-02-19 NOTE — Telephone Encounter (Signed)
Pt was returning DS call from yesterday. Please call [phone]

## 2017-05-08 ENCOUNTER — Encounter: Payer: Self-pay | Admitting: Gastroenterology

## 2017-06-26 ENCOUNTER — Encounter: Payer: Self-pay | Admitting: Gastroenterology

## 2017-09-09 ENCOUNTER — Ambulatory Visit: Payer: Self-pay | Admitting: "Endocrinology

## 2018-02-21 ENCOUNTER — Encounter: Payer: Self-pay | Admitting: "Endocrinology

## 2018-02-21 ENCOUNTER — Ambulatory Visit: Payer: Medicare Other | Admitting: "Endocrinology

## 2018-02-21 VITALS — BP 115/61 | HR 92 | Ht 66.0 in | Wt 164.0 lb

## 2018-02-21 DIAGNOSIS — E042 Nontoxic multinodular goiter: Secondary | ICD-10-CM | POA: Diagnosis not present

## 2018-02-21 NOTE — Progress Notes (Signed)
Endocrinology Consult Note                                            02/21/2018, 1:24 PM   Subjective:    Patient ID: Isabella Scott, female    DOB: 1956-10-06, PCP Sasser, Silvestre Moment, MD   Past Medical History:  Diagnosis Date  . Anxiety   . Arthritis   . Crohn disease ([place])   . Dysphagia   . GERD (gastroesophageal reflux disease)   . 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 BUNIONECTOMY Left 2000  . BUNIONECTOMY Right 2005  . COLONOSCOPY N/A 2010  . COLONOSCOPY N/A 01/28/2017   Procedure: COLONOSCOPY;  Surgeon: Danie Binder, MD;  [facility];  Service: Endoscopy;  Laterality: N/A;  9:30am  . ESOPHAGOGASTRODUODENOSCOPY N/A 2010   with dilation  . PARTIAL COLECTOMY N/A 1981   terminal ileum resection   Social History   Socioeconomic History  . Marital status: Married    Spouse name: Not on file  . Number of children: Not on file  . Years of education: Not on file  . Highest education level: Not on file  Occupational History  . Not on file  Social Needs  . Financial resource strain: Not on file  . Food insecurity:    Worry: Not on file    Inability: Not on file  . Transportation needs:    Medical: Not on file    Non-medical: Not on file  Tobacco Use  . Smoking status: Former Smoker    Types: Cigarettes    Last attempt to quit: 01/03/1997    Years since quitting: 21.1  . Smokeless tobacco: Never Used  Substance and Sexual Activity  . Alcohol use: No  . Drug use: No  . Sexual activity: Not on file  Lifestyle  . Physical activity:    Days per week: Not on file    Minutes per session: Not on file  . Stress: Not on file  Relationships  . Social connections:    Talks on phone: Not on file    Gets together: Not on file    Attends religious service: Not on file    Active member of club or organization: Not on file    Attends meetings of clubs or organizations: Not on file    Relationship  status: Not on file  Other Topics Concern  . Not on file  Social History Narrative  . Not on file   Outpatient Encounter Medications as of 02/21/2018  Medication Sig  . acetaminophen (TYLENOL) 500 MG tablet Take 500-1,000 mg by mouth daily as needed for moderate pain or headache.  Marland Kitchen aspirin EC 81 MG tablet Take 81 mg by mouth daily.  Marland Kitchen azaTHIOprine (IMURAN) 50 MG tablet Take 50 mg by mouth every evening.   . Calcium Carb-Cholecalciferol (CALCIUM 600+D) 600-800 MG-UNIT TABS Take 1 tablet by mouth daily.  . cetirizine (ZYRTEC) 10 MG tablet Take 10 mg by mouth daily as needed for allergies.  . citalopram (CELEXA) 20 MG tablet Take 40 mg by mouth daily.  . Cyanocobalamin (VITAMIN B-12 IJ) Inject 1 Dose as directed every 30 (thirty) days.   Marland Kitchen esomeprazole (NEXIUM) 40 MG capsule Take 40 mg by mouth daily as needed (indigestion).   . LORazepam (ATIVAN) 0.5 MG  tablet Take 0.5 mg by mouth 2 (two) times daily as needed for anxiety.   . metoprolol tartrate (LOPRESSOR) 25 MG tablet Take 25 mg by mouth daily.   . Multiple Vitamin (MULTIVITAMIN) capsule Take 1 capsule by mouth daily.  . Naphazoline-Pheniramine (OPCON-A OP) Apply 1 drop to eye daily as needed (allergies).  . potassium chloride (K-DUR,KLOR-CON) 10 MEQ tablet Take 10 mEq by mouth 2 (two) times daily.  . predniSONE (DELTASONE) 1 MG tablet Take 4 mg by mouth daily with breakfast.  . Tetrahydrozoline HCl (VISINE OP) Apply 1 drop to eye daily as needed (dry eyes).  . [DISCONTINUED] cholestyramine light (PREVALITE) 4 GM/DOSE powder Take 1 packet by mouth daily as needed for diarrhea or loose stools.   No facility-administered encounter medications on file as of 02/21/2018.    ALLERGIES: Allergies  Allergen Reactions  . Meperidine     Funny feeling  . Morphine And Related     Funny feeling  . Sulfamethoxazole Itching and Rash    VACCINATION STATUS:  There is no immunization history on file for this patient.  HPI Isabella Scott is  62 y.o. female who presents today with a medical history as above. she is being seen in consultation for multinodular goiter requested by Manon Hilding, MD.  -She denies prior history of thyroid dysfunction.  She was found to have multinodular goiter on ultrasound done in October 2018.  Largest nodule reported on the right lobe of her thyroid was 0.5 cm.  She reports progressive weight loss, heat intolerance, and anxiety.   -She denies dysphagia, shortness of breath, change in her voice.  She denies any family history of thyroid cancer.  She denies exposure to neck radiation.  Review of Systems  Constitutional: + weight loss, + fatigue, + subjective hyperthermia. Eyes: no blurry vision, no xerophthalmia ENT: no sore throat, no nodules palpated in throat, no dysphagia/odynophagia, no hoarseness Cardiovascular: no Chest Pain, no Shortness of Breath, no palpitations, no leg swelling Respiratory: no cough, no SOB Gastrointestinal: no Nausea/Vomiting/Diarhhea Musculoskeletal: no muscle/joint aches Skin: no rashes Neurological: no tremors, no numbness, no tingling, no dizziness Psychiatric: no depression, no anxiety  Objective:    BP 115/61   Pulse 92   Ht 5' 6"  (1.676 m)   Wt 164 lb (74.4 kg)   BMI 26.47 kg/m   Wt Readings from Last 3 Encounters:  02/21/18 164 lb (74.4 kg)  01/28/17 172 lb (78 kg)  01/03/17 172 lb 9.6 oz (78.3 kg)    Physical Exam  Constitutional: + Appropriate weight for height, + anxious state of mind, alert and oriented x3.   Eyes: PERRLA, EOMI, no exophthalmos ENT: moist mucous membranes, + palpable thyromegaly, no cervical lymphadenopathy Cardiovascular: normal precordial activity, Regular Rate and Rhythm, no Murmur/Rubs/Gallops Respiratory:  adequate breathing efforts, no gross chest deformity, Clear to auscultation bilaterally Gastrointestinal: abdomen soft, Non -tender, No distension, Bowel Sounds present Musculoskeletal: no gross deformities, strength  intact in all four extremities Skin: moist, warm, no rashes Neurological: no tremor with outstretched hands, Deep tendon reflexes normal in all four extremities.  CMP ( most recent) CMP     Component Value Date/Time   NA 143 01/16/2017 1302   K 4.3 01/16/2017 1302   CL 105 01/16/2017 1302   CO2 24 01/16/2017 1302   GLUCOSE 72 01/16/2017 1302   BUN 6 (L) 01/16/2017 1302   CREATININE 0.88 01/16/2017 1302   CALCIUM 9.4 01/16/2017 1302   PROT 7.4 01/16/2017 1302   ALBUMIN  4.0 01/16/2017 1302   AST 17 01/16/2017 1302   ALT 9 01/16/2017 1302   ALKPHOS 82 01/16/2017 1302   BILITOT 0.2 01/16/2017 1302   No recent thyroid function tests to review.  Her thyroid ultrasound on August 15, 2017 showed right lobe 4.3 cm and left lobe 4.1 cm.  Scattered bilateral small nodules, largest on the right lobe 0.5 cm.    Assessment & Plan:   1. Multinodular goiter - Isabella Scott  is being seen at a kind request of Sasser, Silvestre Moment, MD. - I have reviewed her available thyroid records and clinically evaluated the patient. - Based on  reviews, she has multinodular goiter which requires follow-up thyroid sonogram to monitor nodular size.   -She has some subtle clinical signs suspicious for thyrotoxicosis. -She will have complete set of thyroid function test this morning. -she will return in 2 week to review her repeat labs.   If her labs are suggestive of hyperthyroidism, she would be considered for thyroid uptake and scan to confirm diagnosis. - I did not initiate any new prescriptions today. - I advised patient to maintain close follow up with Sasser, Silvestre Moment, MD for primary care needs.   - Time spent with the patient: 45 minutes, of which >50% was spent in obtaining information about her symptoms, reviewing her previous labs, evaluations, and treatments, counseling her about her multinodular goiter, and developing a plan to confirm the diagnosis and long term treatment as necessary.  Isabella Scott participated in the discussions, expressed understanding, and voiced agreement with the above plans.  All questions were answered to her satisfaction. she is encouraged to contact clinic should she have any questions or concerns prior to her return visit.  Follow up plan: Return in about 2 weeks (around 03/07/2018) for labs today, Thyroid / Neck Ultrasound.   Glade Lloyd, MD [facility] [address] [place], [place] [address] Phone: [phone]  Fax: [phone]     02/21/2018, 1:24 PM  This note was partially dictated with voice recognition software. Similar sounding words can be transcribed inadequately or may not  be corrected upon review.

## 2018-02-24 LAB — T3, FREE: T3, Free: 9.2 pg/mL — ABNORMAL HIGH (ref 2.3–4.2)

## 2018-02-24 LAB — TSH: TSH: 0.01 m[IU]/L — AB (ref 0.40–4.50)

## 2018-02-24 LAB — T4, FREE: Free T4: 2.6 ng/dL — ABNORMAL HIGH (ref 0.8–1.8)

## 2018-02-24 LAB — THYROGLOBULIN ANTIBODY: Thyroglobulin Ab: <1 IU/mL (ref <=1)

## 2018-02-24 LAB — THYROID PEROXIDASE ANTIBODY: THYROID PEROXIDASE ANTIBODY: 14 [IU]/mL — AB (ref <9)

## 2018-02-28 ENCOUNTER — Ambulatory Visit (HOSPITAL_COMMUNITY)
Admission: RE | Admit: 2018-02-28 | Discharge: 2018-02-28 | Disposition: A | Discharge status: Home / Self Care | Payer: Medicare Other | Source: Ambulatory Visit | Attending: "Endocrinology | Admitting: "Endocrinology

## 2018-02-28 DIAGNOSIS — E042 Nontoxic multinodular goiter: Secondary | ICD-10-CM | POA: Insufficient documentation

## 2018-03-06 ENCOUNTER — Ambulatory Visit (INDEPENDENT_AMBULATORY_CARE_PROVIDER_SITE_OTHER): Payer: Medicare Other | Admitting: "Endocrinology

## 2018-03-06 ENCOUNTER — Encounter: Payer: Self-pay | Admitting: "Endocrinology

## 2018-03-06 VITALS — BP 147/92 | HR 93 | Ht 66.0 in | Wt 165.0 lb

## 2018-03-06 DIAGNOSIS — E0500 Thyrotoxicosis with diffuse goiter without thyrotoxic crisis or storm: Secondary | ICD-10-CM | POA: Diagnosis not present

## 2018-03-06 DIAGNOSIS — E0590 Thyrotoxicosis, unspecified without thyrotoxic crisis or storm: Secondary | ICD-10-CM | POA: Diagnosis not present

## 2018-03-06 NOTE — Progress Notes (Signed)
Endocrinology follow-up  Note                                            03/06/2018, 6:08 PM   Subjective:    Patient ID: Isabella Scott, female    DOB: Mar 26, 1956, PCP Sasser, Silvestre Moment, MD   Past Medical History:  Diagnosis Date  . Anxiety   . Arthritis   . Crohn disease ([place])   . Dysphagia   . GERD (gastroesophageal reflux disease)   . 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 BUNIONECTOMY Left 2000  . BUNIONECTOMY Right 2005  . COLONOSCOPY N/A 2010  . COLONOSCOPY N/A 01/28/2017   Procedure: COLONOSCOPY;  Surgeon: Danie Binder, MD;  Location[facility];  Service: Endoscopy;  Laterality: N/A;  9:30am  . ESOPHAGOGASTRODUODENOSCOPY N/A 2010   with dilation  . PARTIAL COLECTOMY N/A 1981   terminal ileum resection   Social History   Socioeconomic History  . Marital status: Married    Spouse name: Not on file  . Number of children: Not on file  . Years of education: Not on file  . Highest education level: Not on file  Occupational History  . Not on file  Social Needs  . Financial resource strain: Not on file  . Food insecurity:    Worry: Not on file    Inability: Not on file  . Transportation needs:    Medical: Not on file    Non-medical: Not on file  Tobacco Use  . Smoking status: Former Smoker    Types: Cigarettes    Last attempt to quit: 01/03/1997    Years since quitting: 21.1  . Smokeless tobacco: Never Used  Substance and Sexual Activity  . Alcohol use: No  . Drug use: No  . Sexual activity: Not on file  Lifestyle  . Physical activity:    Days per week: Not on file    Minutes per session: Not on file  . Stress: Not on file  Relationships  . Social connections:    Talks on phone: Not on file    Gets together: Not on file    Attends religious service: Not on file    Active member of club or organization: Not on file    Attends meetings of clubs or organizations: Not on file    Relationship  status: Not on file  Other Topics Concern  . Not on file  Social History Narrative  . Not on file   Outpatient Encounter Medications as of 03/06/2018  Medication Sig  . acetaminophen (TYLENOL) 500 MG tablet Take 500-1,000 mg by mouth daily as needed for moderate pain or headache.  Marland Kitchen aspirin EC 81 MG tablet Take 81 mg by mouth daily.  Marland Kitchen azaTHIOprine (IMURAN) 50 MG tablet Take 50 mg by mouth every evening.   . Calcium Carb-Cholecalciferol (CALCIUM 600+D) 600-800 MG-UNIT TABS Take 1 tablet by mouth daily.  . cetirizine (ZYRTEC) 10 MG tablet Take 10 mg by mouth daily as needed for allergies.  . citalopram (CELEXA) 20 MG tablet Take 40 mg by mouth daily.  . Cyanocobalamin (VITAMIN B-12 IJ) Inject 1 Dose as directed every 30 (thirty) days.   Marland Kitchen esomeprazole (NEXIUM) 40 MG capsule Take 40 mg by mouth daily as needed (indigestion).   . LORazepam (ATIVAN) 0.5  MG tablet Take 0.5 mg by mouth 2 (two) times daily as needed for anxiety.   . metoprolol tartrate (LOPRESSOR) 25 MG tablet Take 25 mg by mouth daily.   . Multiple Vitamin (MULTIVITAMIN) capsule Take 1 capsule by mouth daily.  . Naphazoline-Pheniramine (OPCON-A OP) Apply 1 drop to eye daily as needed (allergies).  . potassium chloride (K-DUR,KLOR-CON) 10 MEQ tablet Take 10 mEq by mouth 2 (two) times daily.  . predniSONE (DELTASONE) 1 MG tablet Take 4 mg by mouth daily with breakfast.  . Tetrahydrozoline HCl (VISINE OP) Apply 1 drop to eye daily as needed (dry eyes).   No facility-administered encounter medications on file as of 03/06/2018.    ALLERGIES: Allergies  Allergen Reactions  . Meperidine     Funny feeling  . Morphine And Related     Funny feeling  . Sulfamethoxazole Itching and Rash    VACCINATION STATUS:  There is no immunization history on file for this patient.  HPI Isabella Scott is 62 y.o. female who presents today with a medical history as above. she is hernia with complete thyroid function tests after she was seen  in consultation for multinodular goiter  requested by Manon Hilding, MD.  -She denies prior history of thyroid dysfunction.  She was found to have multinodular goiter on ultrasound done in October 2018.  Largest nodule reported on the right lobe of her thyroid was 0.5 cm.  She reports progressive weight loss, heat intolerance, and anxiety.   -Her previsit labs are consistent with hyperthyroidism. -She denies dysphagia, shortness of breath, change in her voice.  She denies any family history of thyroid cancer.  She denies exposure to neck radiation.  Review of Systems  Constitutional: + weight loss, + fatigue, + subjective hyperthermia. Eyes: no blurry vision, no xerophthalmia ENT: no sore throat, no nodules palpated in throat, no dysphagia/odynophagia, no hoarseness Cardiovascular: no chest pain, + palpitations.   Respiratory: no cough, no SOB Gastrointestinal: no Nausea/Vomiting/Diarhhea Musculoskeletal: no muscle/joint aches Skin: no rashes Neurological: no tremors, no numbness, no tingling, no dizziness Psychiatric: no depression, no anxiety  Objective:    BP (!) 147/92   Pulse 93   Ht 5' 6"  (1.676 m)   Wt 165 lb (74.8 kg)   BMI 26.63 kg/m   Wt Readings from Last 3 Encounters:  03/06/18 165 lb (74.8 kg)  02/21/18 164 lb (74.4 kg)  01/28/17 172 lb (78 kg)    Physical Exam  Constitutional: + Appropriate weight for height, + anxious state of mind, alert and oriented x3.   Eyes: PERRLA, EOMI, no exophthalmos ENT: moist mucous membranes, + palpable thyromegaly, no cervical lymphadenopathy Musculoskeletal: no gross deformities, strength intact in all four extremities Skin: moist, warm, no rashes Neurological: no tremor with outstretched hands, Deep tendon reflexes normal in all four extremities.  CMP ( most recent) CMP     Component Value Date/Time   NA 143 01/16/2017 1302   K 4.3 01/16/2017 1302   CL 105 01/16/2017 1302   CO2 24 01/16/2017 1302   GLUCOSE 72 01/16/2017  1302   BUN 6 (L) 01/16/2017 1302   CREATININE 0.88 01/16/2017 1302   CALCIUM 9.4 01/16/2017 1302   PROT 7.4 01/16/2017 1302   ALBUMIN 4.0 01/16/2017 1302   AST 17 01/16/2017 1302   ALT 9 01/16/2017 1302   ALKPHOS 82 01/16/2017 1302   BILITOT 0.2 01/16/2017 1302     No recent thyroid function tests to review.  Her thyroid ultrasound on August 15, 2017 showed right lobe 4.3 cm and left lobe 4.1 cm.  Scattered bilateral small nodules, largest on the right lobe 0.5 cm.  Repeat thyroid ultrasound from February 28, 2018 is unremarkable, no nodular lesions.  Assessment & Plan:   -1. Hyperthyroidism with positive antithyroid antibodies -Her clinical presentation and previsit labs are consistent with hyperthyroidism.  Thyroid ultrasound is unremarkable. , she will be considered for  thyroid uptake and scan to confirm diagnosis.  This study would be scheduled to be done as soon as possible.  She will return in 10 days for treatment decisions which may involve radioactive iodine ablation of the thyroid with subsequent requirement for lifelong thyroid hormone replacement.  She is made aware of this expectation and willing to proceed. - I did not initiate any new prescriptions today. - I advised patient to maintain close follow up with Sasser, Silvestre Moment, MD for primary care needs.  Follow up plan: Return in about 10 days (around 03/16/2018) for follow up with thyroid uptake and scan.   Glade Lloyd, MD [facility] [address] [place], [place] [address] Phone: [phone]  Fax: [phone]     03/06/2018, 6:08 PM  This note was partially dictated with voice recognition software. Similar sounding words can be transcribed inadequately or may not  be corrected upon review.

## 2018-03-07 ENCOUNTER — Telehealth: Payer: Self-pay

## 2018-03-07 NOTE — Telephone Encounter (Signed)
Appt made for pts NM uptake/scan. Called pt today to give her this appt and schedule OV appt w Dr Dorris Fetch. Called twice. No answer. No VM. Will call back next week.

## 2018-03-12 ENCOUNTER — Encounter (HOSPITAL_COMMUNITY): Payer: Medicare Other

## 2018-03-13 ENCOUNTER — Encounter (HOSPITAL_COMMUNITY): Payer: Medicare Other

## 2018-03-17 ENCOUNTER — Other Ambulatory Visit (HOSPITAL_COMMUNITY): Payer: Medicare Other

## 2018-03-18 ENCOUNTER — Encounter (HOSPITAL_COMMUNITY)
Admission: RE | Admit: 2018-03-18 | Discharge: 2018-03-18 | Disposition: A | Discharge status: Home / Self Care | Payer: Medicare Other | Source: Ambulatory Visit | Attending: "Endocrinology | Admitting: "Endocrinology

## 2018-03-18 ENCOUNTER — Encounter (HOSPITAL_COMMUNITY): Payer: Self-pay

## 2018-03-18 ENCOUNTER — Other Ambulatory Visit (HOSPITAL_COMMUNITY): Payer: Medicare Other

## 2018-03-18 DIAGNOSIS — E0590 Thyrotoxicosis, unspecified without thyrotoxic crisis or storm: Secondary | ICD-10-CM

## 2018-03-18 DIAGNOSIS — E0500 Thyrotoxicosis with diffuse goiter without thyrotoxic crisis or storm: Secondary | ICD-10-CM | POA: Diagnosis present

## 2018-03-18 MED ORDER — SODIUM IODIDE I-123 7.4 MBQ CAPS
287.4000 | ORAL_CAPSULE | Freq: Once | ORAL | Status: AC
  Administered 2018-03-18: 287.4 via ORAL

## 2018-03-19 ENCOUNTER — Encounter (HOSPITAL_COMMUNITY)
Admission: RE | Admit: 2018-03-19 | Discharge: 2018-03-19 | Disposition: A | Discharge status: Home / Self Care | Payer: Medicare Other | Source: Ambulatory Visit | Attending: "Endocrinology | Admitting: "Endocrinology

## 2018-03-24 ENCOUNTER — Ambulatory Visit: Payer: Medicare Other | Admitting: "Endocrinology

## 2018-03-24 ENCOUNTER — Encounter: Payer: Self-pay | Admitting: "Endocrinology

## 2018-03-25 ENCOUNTER — Ambulatory Visit (INDEPENDENT_AMBULATORY_CARE_PROVIDER_SITE_OTHER): Payer: Medicare Other | Admitting: "Endocrinology

## 2018-03-25 ENCOUNTER — Encounter: Payer: Self-pay | Admitting: "Endocrinology

## 2018-03-25 VITALS — BP 135/75 | HR 86 | Ht 66.0 in | Wt 167.0 lb

## 2018-03-25 DIAGNOSIS — E0500 Thyrotoxicosis with diffuse goiter without thyrotoxic crisis or storm: Secondary | ICD-10-CM

## 2018-03-25 DIAGNOSIS — E0590 Thyrotoxicosis, unspecified without thyrotoxic crisis or storm: Secondary | ICD-10-CM | POA: Diagnosis not present

## 2018-03-25 NOTE — Progress Notes (Signed)
Endocrinology follow-up  Note                                            03/25/2018, 9:02 AM   Subjective:    Patient ID: Isabella Scott, female    DOB: 1956-01-28, PCP Sasser, Silvestre Moment, MD   Past Medical History:  Diagnosis Date  . Anxiety   . Arthritis   . Crohn disease ([place])   . Dysphagia   . GERD (gastroesophageal reflux disease)   . 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 BUNIONECTOMY Left 2000  . BUNIONECTOMY Right 2005  . COLONOSCOPY N/A 2010  . COLONOSCOPY N/A 01/28/2017   Procedure: COLONOSCOPY;  Surgeon: Danie Binder, MD;  [facility];  Service: Endoscopy;  Laterality: N/A;  9:30am  . ESOPHAGOGASTRODUODENOSCOPY N/A 2010   with dilation  . PARTIAL COLECTOMY N/A 1981   terminal ileum resection   Social History   Socioeconomic History  . Marital status: Married    Spouse name: Not on file  . Number of children: Not on file  . Years of education: Not on file  . Highest education level: Not on file  Occupational History  . Not on file  Social Needs  . Financial resource strain: Not on file  . Food insecurity:    Worry: Not on file    Inability: Not on file  . Transportation needs:    Medical: Not on file    Non-medical: Not on file  Tobacco Use  . Smoking status: Former Smoker    Types: Cigarettes    Last attempt to quit: 01/03/1997    Years since quitting: 21.2  . Smokeless tobacco: Never Used  Substance and Sexual Activity  . Alcohol use: No  . Drug use: No  . Sexual activity: Not on file  Lifestyle  . Physical activity:    Days per week: Not on file    Minutes per session: Not on file  . Stress: Not on file  Relationships  . Social connections:    Talks on phone: Not on file    Gets together: Not on file    Attends religious service: Not on file    Active member of club or organization: Not on file    Attends meetings of clubs or organizations: Not on file    Relationship  status: Not on file  Other Topics Concern  . Not on file  Social History Narrative  . Not on file   Outpatient Encounter Medications as of 03/25/2018  Medication Sig  . acetaminophen (TYLENOL) 500 MG tablet Take 500-1,000 mg by mouth daily as needed for moderate pain or headache.  Marland Kitchen aspirin EC 81 MG tablet Take 81 mg by mouth daily.  Marland Kitchen azaTHIOprine (IMURAN) 50 MG tablet Take 50 mg by mouth every evening.   . Calcium Carb-Cholecalciferol (CALCIUM 600+D) 600-800 MG-UNIT TABS Take 1 tablet by mouth daily.  . cetirizine (ZYRTEC) 10 MG tablet Take 10 mg by mouth daily as needed for allergies.  . citalopram (CELEXA) 20 MG tablet Take 40 mg by mouth daily.  . Cyanocobalamin (VITAMIN B-12 IJ) Inject 1 Dose as directed every 30 (thirty) days.   Marland Kitchen esomeprazole (NEXIUM) 40 MG capsule Take 40 mg by mouth daily as needed (indigestion).   . LORazepam (ATIVAN) 0.5  MG tablet Take 0.5 mg by mouth 2 (two) times daily as needed for anxiety.   . metoprolol tartrate (LOPRESSOR) 25 MG tablet Take 25 mg by mouth daily.   . Multiple Vitamin (MULTIVITAMIN) capsule Take 1 capsule by mouth daily.  . Naphazoline-Pheniramine (OPCON-A OP) Apply 1 drop to eye daily as needed (allergies).  . potassium chloride (K-DUR,KLOR-CON) 10 MEQ tablet Take 10 mEq by mouth 2 (two) times daily.  . predniSONE (DELTASONE) 1 MG tablet Take 4 mg by mouth daily with breakfast.  . Tetrahydrozoline HCl (VISINE OP) Apply 1 drop to eye daily as needed (dry eyes).   No facility-administered encounter medications on file as of 03/25/2018.    ALLERGIES: Allergies  Allergen Reactions  . Meperidine     Funny feeling  . Morphine And Related     Funny feeling  . Sulfamethoxazole Itching and Rash    VACCINATION STATUS:  There is no immunization history on file for this patient.  HPI Isabella Scott is 62 y.o. female who presents today with a medical history as above. she is here with results of her thyroid uptake and scan which confirmed  hyperparathyroidism from Graves' disease in agreement with her recent thyroid function tests.   -She denies prior history of thyroid dysfunction.  She was found to have multinodular goiter on ultrasound done in October 2018.  Largest nodule reported on the right lobe of her thyroid was 0.5 cm.  She reports progressive weight loss, heat intolerance, and anxiety.    -She denies dysphagia, shortness of breath, change in her voice.  She denies any family history of thyroid cancer.  She denies exposure to neck radiation.  Review of Systems  Constitutional: + weight loss, + fatigue, + subjective hyperthermia. Eyes: no blurry vision, no xerophthalmia ENT: no sore throat, no nodules palpated in throat, no dysphagia/odynophagia, no hoarseness Cardiovascular: no chest pain, + palpitations.   Respiratory: no cough, no SOB Gastrointestinal: no Nausea/Vomiting/Diarhhea Musculoskeletal: no muscle/joint aches Skin: no rashes Neurological: + tremors, no numbness, no tingling, no dizziness Psychiatric: no depression, no anxiety  Objective:    BP 135/75   Pulse 86   Ht 5' 6"  (1.676 m)   Wt 167 lb (75.8 kg)   BMI 26.95 kg/m   Wt Readings from Last 3 Encounters:  03/25/18 167 lb (75.8 kg)  03/06/18 165 lb (74.8 kg)  02/21/18 164 lb (74.4 kg)    Physical Exam  Constitutional: + Appropriate weight for height, + anxious state of mind,  alert and oriented x3.   Eyes: PERRLA, EOMI, + exophthalmos, + lid lag. ENT: moist mucous membranes, + palpable thyromegaly, no cervical lymphadenopathy Musculoskeletal: no gross deformities, strength intact in all four extremities Skin: moist, warm, no rashes Neurological: + tremor with outstretched hands, Deep tendon reflexes normal in all four extremities.  CMP ( most recent) CMP     Component Value Date/Time   NA 143 01/16/2017 1302   K 4.3 01/16/2017 1302   CL 105 01/16/2017 1302   CO2 24 01/16/2017 1302   GLUCOSE 72 01/16/2017 1302   BUN 6 (L)  01/16/2017 1302   CREATININE 0.88 01/16/2017 1302   CALCIUM 9.4 01/16/2017 1302   PROT 7.4 01/16/2017 1302   ALBUMIN 4.0 01/16/2017 1302   AST 17 01/16/2017 1302   ALT 9 01/16/2017 1302   ALKPHOS 82 01/16/2017 1302   BILITOT 0.2 01/16/2017 1302   Recent Results (from the past 2160 hour(s))  Thyroid peroxidase antibody     Status:  Abnormal   Collection Time: 02/21/18 11:42 AM  Result Value Ref Range   Thyroperoxidase Ab SerPl-aCnc 14 (H) <9 IU/mL  Thyroglobulin antibody     Status: None   Collection Time: 02/21/18 11:42 AM  Result Value Ref Range   Thyroglobulin Ab <1 < or = 1 IU/mL  T3, free     Status: Abnormal   Collection Time: 02/21/18 11:42 AM  Result Value Ref Range   T3, Free 9.2 (H) 2.3 - 4.2 pg/mL  T4, free     Status: Abnormal   Collection Time: 02/21/18 11:42 AM  Result Value Ref Range   Free T4 2.6 (H) 0.8 - 1.8 ng/dL  TSH     Status: Abnormal   Collection Time: 02/21/18 11:42 AM  Result Value Ref Range   TSH 0.01 (L) 0.40 - 4.50 mIU/L     Her thyroid ultrasound on August 15, 2017 showed right lobe 4.3 cm and left lobe 4.1 cm.  Scattered bilateral small nodules, largest on the right lobe 0.5 cm.  Repeat thyroid ultrasound from February 28, 2018 is unremarkable, no nodular lesions.   Mar 19, 2018 thyroid uptake and scan: FINDINGS: Homogeneous tracer distribution throughout both thyroid lobes. No focal areas of increased or decreased tracer localization seen. 4 hour I-123 uptake = 15.7% (normal 5-20%) 24 hour I-123 uptake = 40.2% (normal 10-30%)  IMPRESSION: Normal thyroid scan.  Mildly elevated 24 hour radio iodine uptake of 40.2% consistent with hyperthyroidism.  Findings consistent with Graves disease.   Assessment & Plan:   1.  Primary hyperthyroidism  2.  Graves' disease -Her work-up  Confirms primary hyperthyroidism from Graves' disease.  -She is accompanied by her husband today.  Options of treatment revised once again with her.  -She  agrees with my recommendation of ablative therapy with radioactive iodine.  -This treatment will be scheduled to be administered as soon as possible at nuclear medicine facility in [facility]. She understands the subsequent requirement for lifelong thyroid hormone replacement.  She is made aware of this expectation and willing to proceed.  -She is currently on low-dose metoprolol, 25 mg p.o. daily which might have helped with her symptoms.  - I advised patient to maintain close follow up with Sasser, Silvestre Moment, MD for primary care needs.  Follow up plan: Return in about 8 weeks (around 05/20/2018) for follow up with labs after I131 therapy.   Glade Lloyd, MD [facility] [address] [place], [place] [address] Phone: [phone]  Fax: [phone]     03/25/2018, 9:02 AM  This note was partially dictated with voice recognition software. Similar sounding words can be transcribed inadequately or may not  be corrected upon review.

## 2018-03-27 ENCOUNTER — Encounter (HOSPITAL_COMMUNITY)
Admission: RE | Admit: 2018-03-27 | Discharge: 2018-03-27 | Disposition: A | Discharge status: Home / Self Care | Payer: Medicare Other | Source: Ambulatory Visit | Attending: "Endocrinology | Admitting: "Endocrinology

## 2018-03-27 DIAGNOSIS — E0590 Thyrotoxicosis, unspecified without thyrotoxic crisis or storm: Secondary | ICD-10-CM | POA: Insufficient documentation

## 2018-03-27 MED ORDER — SODIUM IODIDE I 131 CAPSULE
12.0000 | Freq: Once | INTRAVENOUS | Status: AC | PRN
  Administered 2018-03-27: 12.4 via ORAL

## 2018-05-16 ENCOUNTER — Encounter: Payer: Self-pay | Admitting: "Endocrinology

## 2018-05-16 LAB — TSH: TSH: 0.01 — AB (ref <=5.90)

## 2018-05-23 ENCOUNTER — Ambulatory Visit (INDEPENDENT_AMBULATORY_CARE_PROVIDER_SITE_OTHER): Payer: Medicare Other | Admitting: "Endocrinology

## 2018-05-23 ENCOUNTER — Encounter: Payer: Self-pay | Admitting: "Endocrinology

## 2018-05-23 VITALS — BP 144/77 | HR 80 | Ht 66.0 in | Wt 172.0 lb

## 2018-05-23 DIAGNOSIS — E0590 Thyrotoxicosis, unspecified without thyrotoxic crisis or storm: Secondary | ICD-10-CM

## 2018-05-23 NOTE — Progress Notes (Signed)
Endocrinology follow-up  Note                                            05/23/2018, 10:07 AM   Subjective:    Patient ID: Isabella Scott, female    DOB: 29-Jun-1956, PCP Sasser, Silvestre Moment, MD   Past Medical History:  Diagnosis Date  . Anxiety   . Arthritis   . Crohn disease ([place])   . Dysphagia   . GERD (gastroesophageal reflux disease)   . 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 BUNIONECTOMY Left 2000  . BUNIONECTOMY Right 2005  . COLONOSCOPY N/A 2010  . COLONOSCOPY N/A 01/28/2017   Procedure: COLONOSCOPY;  Surgeon: Danie Binder, MD;  [facility];  Service: Endoscopy;  Laterality: N/A;  9:30am  . ESOPHAGOGASTRODUODENOSCOPY N/A 2010   with dilation  . PARTIAL COLECTOMY N/A 1981   terminal ileum resection   Social History   Socioeconomic History  . Marital status: Married    Spouse name: Not on file  . Number of children: Not on file  . Years of education: Not on file  . Highest education level: Not on file  Occupational History  . Not on file  Social Needs  . Financial resource strain: Not on file  . Food insecurity:    Worry: Not on file    Inability: Not on file  . Transportation needs:    Medical: Not on file    Non-medical: Not on file  Tobacco Use  . Smoking status: Former Smoker    Types: Cigarettes    Last attempt to quit: 01/03/1997    Years since quitting: 21.3  . Smokeless tobacco: Never Used  Substance and Sexual Activity  . Alcohol use: No  . Drug use: No  . Sexual activity: Not on file  Lifestyle  . Physical activity:    Days per week: Not on file    Minutes per session: Not on file  . Stress: Not on file  Relationships  . Social connections:    Talks on phone: Not on file    Gets together: Not on file    Attends religious service: Not on file    Active member of club or organization: Not on file    Attends meetings of clubs or organizations: Not on file    Relationship  status: Not on file  Other Topics Concern  . Not on file  Social History Narrative  . Not on file   Outpatient Encounter Medications as of 05/23/2018  Medication Sig  . acetaminophen (TYLENOL) 500 MG tablet Take 500-1,000 mg by mouth daily as needed for moderate pain or headache.  Marland Kitchen aspirin EC 81 MG tablet Take 81 mg by mouth daily.  Marland Kitchen azaTHIOprine (IMURAN) 50 MG tablet Take 50 mg by mouth every evening.   . Calcium Carb-Cholecalciferol (CALCIUM 600+D) 600-800 MG-UNIT TABS Take 1 tablet by mouth daily.  . cetirizine (ZYRTEC) 10 MG tablet Take 10 mg by mouth daily as needed for allergies.  . citalopram (CELEXA) 20 MG tablet Take 40 mg by mouth daily.  . Cyanocobalamin (VITAMIN B-12 IJ) Inject 1 Dose as directed every 30 (thirty) days.   Marland Kitchen esomeprazole (NEXIUM) 40 MG capsule Take 40 mg by mouth daily as needed (indigestion).   . LORazepam (ATIVAN) 0.5  MG tablet Take 0.5 mg by mouth 2 (two) times daily as needed for anxiety.   . metoprolol tartrate (LOPRESSOR) 25 MG tablet Take 25 mg by mouth daily.   . Multiple Vitamin (MULTIVITAMIN) capsule Take 1 capsule by mouth daily.  . Naphazoline-Pheniramine (OPCON-A OP) Apply 1 drop to eye daily as needed (allergies).  . potassium chloride (K-DUR,KLOR-CON) 10 MEQ tablet Take 10 mEq by mouth 2 (two) times daily.  . predniSONE (DELTASONE) 1 MG tablet Take 4 mg by mouth daily with breakfast.  . Tetrahydrozoline HCl (VISINE OP) Apply 1 drop to eye daily as needed (dry eyes).   No facility-administered encounter medications on file as of 05/23/2018.    ALLERGIES: Allergies  Allergen Reactions  . Meperidine     Funny feeling  . Morphine And Related     Funny feeling  . Sulfamethoxazole Itching and Rash    VACCINATION STATUS:  There is no immunization history on file for this patient.  HPI Isabella Scott is 62 y.o. female who presents today with a medical history as above. she is status post radioactive iodine thyroid ablation for Graves'  disease on Mar 27, 2018.   -She reports that her previous symptoms have largely subsided.  She has no new complaints today.  She has gained 5 pounds, denies palpitations, heat intolerance, sweating. She sleeps better.  -She denies dysphagia, shortness of breath, change in her voice.  She denies any family history of thyroid cancer.   -Remains on low-dose metoprolol which was started for palpitation prior to her diagnosis of hyperthyroidism.  Review of Systems  Constitutional: + weight gain,  + fatigue, - subjective hyperthermia. Eyes: no blurry vision, no xerophthalmia ENT: no sore throat, no nodules palpated in throat, no dysphagia/odynophagia, no hoarseness Cardiovascular: no chest pain, + palpitations.   Respiratory: no cough, no SOB Gastrointestinal: no Nausea/Vomiting/Diarhhea Musculoskeletal: no muscle/joint aches Skin: no rashes Neurological: + tremors, no numbness, no tingling, no dizziness Psychiatric: no depression, no anxiety  Objective:    BP (!) 144/77   Pulse 80   Ht 5' 6"  (1.676 m)   Wt 172 lb (78 kg)   BMI 27.76 kg/m   Wt Readings from Last 3 Encounters:  05/23/18 172 lb (78 kg)  03/25/18 167 lb (75.8 kg)  03/06/18 165 lb (74.8 kg)    Physical Exam  Constitutional: + Appropriate weight for height,  + stable state of mind ,  alert and oriented x3.   Eyes: PERRLA, EOMI, + exophthalmos, + lid lag. ENT: moist mucous membranes, + palpable thyromegaly, no cervical lymphadenopathy Musculoskeletal: no gross deformities, strength intact in all four extremities Skin: moist, warm, no rashes Neurological: - tremor with outstretched hands, Deep tendon reflexes normal   CMP     Component Value Date/Time   NA 143 01/16/2017 1302   K 4.3 01/16/2017 1302   CL 105 01/16/2017 1302   CO2 24 01/16/2017 1302   GLUCOSE 72 01/16/2017 1302   BUN 6 (L) 01/16/2017 1302   CREATININE 0.88 01/16/2017 1302   CALCIUM 9.4 01/16/2017 1302   PROT 7.4 01/16/2017 1302   ALBUMIN 4.0  01/16/2017 1302   AST 17 01/16/2017 1302   ALT 9 01/16/2017 1302   ALKPHOS 82 01/16/2017 1302   BILITOT 0.2 01/16/2017 1302   May 16, 2018 labs showed TSH <0.006, free T4 1.74  Her thyroid ultrasound on August 15, 2017 showed right lobe 4.3 cm and left lobe 4.1 cm.  Scattered bilateral small nodules, largest on the  right lobe 0.5 cm.  Repeat thyroid ultrasound from February 28, 2018 is unremarkable, no nodular lesions.   Mar 19, 2018 thyroid uptake and scan: FINDINGS: Homogeneous tracer distribution throughout both thyroid lobes. No focal areas of increased or decreased tracer localization seen. 4 hour I-123 uptake = 15.7% (normal 5-20%) 24 hour I-123 uptake = 40.2% (normal 10-30%)  IMPRESSION: Normal thyroid scan.  Mildly elevated 24 hour radio iodine uptake of 40.2% consistent with  Hyperthyroidism.  Findings consistent with Graves disease.   Assessment & Plan:   1.  Primary hyperthyroidism  2.  Graves' disease -She is status post I-131 thyroid ablation for hyperthyroidism from Graves' disease with clinical and biochemical response.   -Her thyroid function tests are improving, however not in the hypothyroid range for her to start thyroid hormone replacement.   -I advised her to return in 8 weeks with repeat TSH/free T4 for reevaluation.    -The metoprolol which was started for tachyarrhythmia significantly prior to her hyperthyroidism will be continued at low-dose 25 mg p.o. daily.   -Patient understands the possibility of thyroid hormone replacement subsequent to radioactive iodine thyroid ablation.  - I advised patient to maintain close follow up with Sasser, Silvestre Moment, MD for primary care needs.  Follow up plan: Return in about 8 weeks (around 07/18/2018) for follow up with pre-visit labs.   Glade Lloyd, MD [facility] [address] [place], [place] [address] Phone: [phone]  Fax: [phone]     05/23/2018,  10:07 AM  This note was partially dictated with voice recognition software. Similar sounding words can be transcribed inadequately or may not  be corrected upon review.

## 2018-07-08 LAB — TSH: TSH: 55.5 — AB (ref 0.41–5.90)

## 2018-07-17 ENCOUNTER — Ambulatory Visit (INDEPENDENT_AMBULATORY_CARE_PROVIDER_SITE_OTHER): Payer: Medicare Other | Admitting: "Endocrinology

## 2018-07-17 ENCOUNTER — Encounter: Payer: Self-pay | Admitting: "Endocrinology

## 2018-07-17 VITALS — BP 119/73 | HR 76 | Ht 66.0 in | Wt 173.8 lb

## 2018-07-17 DIAGNOSIS — E890 Postprocedural hypothyroidism: Secondary | ICD-10-CM | POA: Diagnosis not present

## 2018-07-17 MED ORDER — LEVOTHYROXINE SODIUM 75 MCG PO TABS: 75.0000 ug | ORAL_TABLET | Freq: Every day | ORAL | 3 refills | Status: DC

## 2018-07-17 NOTE — Progress Notes (Signed)
Endocrinology follow-up  Note                                            07/17/2018, 1:32 PM   Subjective:    Patient ID: Isabella Scott, female    DOB: 1956/01/08, PCP Sasser, Silvestre Moment, MD   Past Medical History:  Diagnosis Date  . Anxiety   . Arthritis   . Crohn disease ([place])   . Dysphagia   . GERD (gastroesophageal reflux disease)   . 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 BUNIONECTOMY Left 2000  . BUNIONECTOMY Right 2005  . COLONOSCOPY N/A 2010  . COLONOSCOPY N/A 01/28/2017   Procedure: COLONOSCOPY;  Surgeon: Danie Binder, MD;  [facility];  Service: Endoscopy;  Laterality: N/A;  9:30am  . ESOPHAGOGASTRODUODENOSCOPY N/A 2010   with dilation  . PARTIAL COLECTOMY N/A 1981   terminal ileum resection   Social History   Socioeconomic History  . Marital status: Married    Spouse name: Not on file  . Number of children: Not on file  . Years of education: Not on file  . Highest education level: Not on file  Occupational History  . Not on file  Social Needs  . Financial resource strain: Not on file  . Food insecurity:    Worry: Not on file    Inability: Not on file  . Transportation needs:    Medical: Not on file    Non-medical: Not on file  Tobacco Use  . Smoking status: Former Smoker    Types: Cigarettes    Last attempt to quit: 01/03/1997    Years since quitting: 21.5  . Smokeless tobacco: Never Used  Substance and Sexual Activity  . Alcohol use: No  . Drug use: No  . Sexual activity: Not on file  Lifestyle  . Physical activity:    Days per week: Not on file    Minutes per session: Not on file  . Stress: Not on file  Relationships  . Social connections:    Talks on phone: Not on file    Gets together: Not on file    Attends religious service: Not on file    Active member of club or organization: Not on file    Attends meetings of clubs or organizations: Not on file    Relationship  status: Not on file  Other Topics Concern  . Not on file  Social History Narrative  . Not on file   Outpatient Encounter Medications as of 07/17/2018  Medication Sig  . acetaminophen (TYLENOL) 500 MG tablet Take 500-1,000 mg by mouth daily as needed for moderate pain or headache.  Marland Kitchen aspirin EC 81 MG tablet Take 81 mg by mouth daily.  Marland Kitchen azaTHIOprine (IMURAN) 50 MG tablet Take 50 mg by mouth every evening.   . Calcium Carb-Cholecalciferol (CALCIUM 600+D) 600-800 MG-UNIT TABS Take 1 tablet by mouth daily.  . cetirizine (ZYRTEC) 10 MG tablet Take 10 mg by mouth daily as needed for allergies.  . citalopram (CELEXA) 20 MG tablet Take 40 mg by mouth daily.  . Cyanocobalamin (VITAMIN B-12 IJ) Inject 1 Dose as directed every 30 (thirty) days.   Marland Kitchen esomeprazole (NEXIUM) 40 MG capsule Take 40 mg by mouth daily as needed (indigestion).   Marland Kitchen levothyroxine (SYNTHROID, LEVOTHROID)  75 MCG tablet Take 1 tablet (75 mcg total) by mouth daily before breakfast.  . LORazepam (ATIVAN) 0.5 MG tablet Take 0.5 mg by mouth 2 (two) times daily as needed for anxiety.   . metoprolol tartrate (LOPRESSOR) 25 MG tablet Take 25 mg by mouth daily.   . Multiple Vitamin (MULTIVITAMIN) capsule Take 1 capsule by mouth daily.  . Naphazoline-Pheniramine (OPCON-A OP) Apply 1 drop to eye daily as needed (allergies).  . potassium chloride (K-DUR,KLOR-CON) 10 MEQ tablet Take 10 mEq by mouth 2 (two) times daily.  . predniSONE (DELTASONE) 1 MG tablet Take 4 mg by mouth daily with breakfast.  . Tetrahydrozoline HCl (VISINE OP) Apply 1 drop to eye daily as needed (dry eyes).   No facility-administered encounter medications on file as of 07/17/2018.    ALLERGIES: Allergies  Allergen Reactions  . Meperidine     Funny feeling  . Morphine And Related     Funny feeling  . Sulfamethoxazole Itching and Rash    VACCINATION STATUS:  There is no immunization history on file for this patient.  HPI Isabella Scott is 62 y.o. female who  presents today with a medical history as above. she is status post radioactive iodine thyroid ablation for Graves' disease on Mar 27, 2018.   -She reports that her previous symptoms have largely subsided.  She has no new complaints today.  She continues to gain weight progressively.    -She denies dysphagia, shortness of breath, change in her voice.  She denies any family history of thyroid cancer.   -Remains on low-dose metoprolol which was started for palpitation prior to her diagnosis of hyperthyroidism.  Review of Systems  Constitutional: + weight gain,  + fatigue, - subjective hyperthermia. Eyes: no blurry vision, no xerophthalmia ENT: no sore throat, no nodules palpated in throat, no dysphagia/odynophagia, no hoarseness Cardiovascular: no chest pain, - palpitations.    Skin: no rashes Neurological: - tremors, no numbness, no tingling, no dizziness Psychiatric: no depression, no anxiety  Objective:    BP 119/73   Pulse 76   Ht 5' 6"  (1.676 m)   Wt 173 lb 12.8 oz (78.8 kg)   SpO2 97%   BMI 28.05 kg/m   Wt Readings from Last 3 Encounters:  07/17/18 173 lb 12.8 oz (78.8 kg)  05/23/18 172 lb (78 kg)  03/25/18 167 lb (75.8 kg)    Physical Exam  Constitutional: + Appropriate weight for height,  + stable state of mind ,  alert and oriented x3.   Eyes: PERRLA, EOMI, + exophthalmos, + lid lag. ENT: moist mucous membranes, + palpable thyromegaly-decreasing, no cervical lymphadenopathy Musculoskeletal: no gross deformities, strength intact in all four extremities Skin: moist, warm, no rashes Neurological: - tremor with outstretched hands, Deep tendon reflexes normal   CMP     Component Value Date/Time   NA 143 01/16/2017 1302   K 4.3 01/16/2017 1302   CL 105 01/16/2017 1302   CO2 24 01/16/2017 1302   GLUCOSE 72 01/16/2017 1302   BUN 6 (L) 01/16/2017 1302   CREATININE 0.88 01/16/2017 1302   CALCIUM 9.4 01/16/2017 1302   PROT 7.4 01/16/2017 1302   ALBUMIN 4.0 01/16/2017  1302   AST 17 01/16/2017 1302   ALT 9 01/16/2017 1302   ALKPHOS 82 01/16/2017 1302   BILITOT 0.2 01/16/2017 1302   May 16, 2018 labs showed TSH <0.006, free T4 1.74  Her thyroid ultrasound on August 15, 2017 showed right lobe 4.3 cm and left lobe  4.1 cm.  Scattered bilateral small nodules, largest on the right lobe 0.5 cm.  Repeat thyroid ultrasound from February 28, 2018 is unremarkable, no nodular lesions.   Mar 19, 2018 thyroid uptake and scan: FINDINGS: Homogeneous tracer distribution throughout both thyroid lobes. No focal areas of increased or decreased tracer localization seen. 4 hour I-123 uptake = 15.7% (normal 5-20%) 24 hour I-123 uptake = 40.2% (normal 10-30%)  IMPRESSION: Normal thyroid scan.  Mildly elevated 24 hour radio iodine uptake of 40.2% consistent with  Hyperthyroidism.  Findings consistent with Graves disease.   Her previsit labs from July 08, 2018 showed significantly elevated TSH of 55.5, significantly lower free T4 at 0.15  Assessment & Plan:   1.  RAI induced hypothyroidism  2.  Graves' disease-resolved status post RAI treatment -Her labs are consistent with treatment effect with RAI induced hypothyroidism.   -I discussed and initiated thyroid hormone replacement with levothyroxine.   -I will start with levothyroxine 75 mcg p.o. every morning with plan to repeat her thyroid function tests in 3 months with office visit.     - We discussed about correct intake of levothyroxine, at fasting, with water, separated by at least 30 minutes from breakfast, and separated by more than 4 hours from calcium, iron, multivitamins, acid reflux medications (PPIs). -Patient is made aware of the fact that thyroid hormone replacement is needed for life, dose to be adjusted by periodic monitoring of thyroid function tests.  -The metoprolol which was started for tachyarrhythmia significantly prior to her hyperthyroidism will be continued at low-dose 25 mg p.o. daily.      - I advised patient to maintain close follow up with Sasser, Silvestre Moment, MD for primary care needs.  Follow up plan: Return in about 3 months (around 10/16/2018) for Follow up with Pre-visit Labs.   Glade Lloyd, MD [facility], [facility] [address] [place], [place] [address] Phone: [phone]  Fax: [phone]     07/17/2018, 1:32 PM  This note was partially dictated with voice recognition software. Similar sounding words can be transcribed inadequately or may not  be corrected upon review.

## 2018-10-15 LAB — TSH: TSH: 33.16 — AB (ref 0.41–5.90)

## 2018-10-16 IMAGING — NM NM THYROID IMAGING W/ UPTAKE MULTI (4&24 HR)
4 series · 4 of 4 positions shown · non-contrast
Comparison: None

CLINICAL DATA: Hyperthyroidism, Graves disease

EXAM:
THYROID SCAN AND UPTAKE - 4 AND 24 HOURS
TECHNIQUE: Following oral administration of V-9TR capsule, anterior planar
imaging was acquired at 24 hours. Thyroid uptake was calculated with
a thyroid probe at 4-6 hours and 24 hours.
RADIOPHARMACEUTICALS:  287.4 uCi V-9TR sodium iodide p.o.

[Series 1: ant w marker · 1.18mm/px · 1 of 1 slices shown]
[im 1/1  full-range]
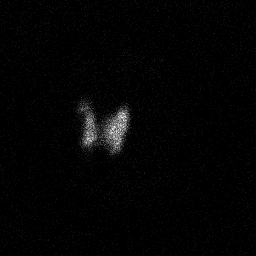

[Series 2: anterior · 1.18mm/px · 1 of 1 slices shown]
[im 1/1  full-range]
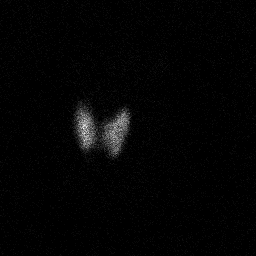

[Series 3: lao · 1.18mm/px · 1 of 1 slices shown]
[im 1/1]
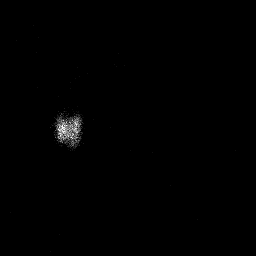

[Series 4: rao · 1.18mm/px · 1 of 1 slices shown]
[im 1/1]
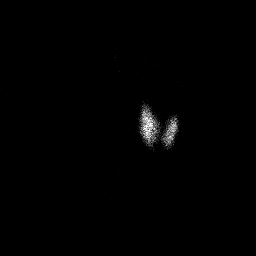

[4 of 4 positions shown; findings below may reference images not displayed]

FINDINGS: Homogeneous tracer distribution throughout both thyroid lobes.

No focal areas of increased or decreased tracer localization seen..

4 hour V-9TR uptake = 15.7% (normal 5-20%)

24 hour V-9TR uptake = 40.2% (normal 10-30%)
IMPRESSION: Normal thyroid scan.

Mildly elevated 24 hour radio iodine uptake of 40.2% consistent with
hyperthyroidism.

Findings consistent with Graves disease.

## 2018-10-20 ENCOUNTER — Ambulatory Visit (INDEPENDENT_AMBULATORY_CARE_PROVIDER_SITE_OTHER): Payer: Medicare Other | Admitting: "Endocrinology

## 2018-10-20 ENCOUNTER — Encounter: Payer: Self-pay | Admitting: "Endocrinology

## 2018-10-20 VITALS — BP 132/82 | HR 71 | Ht 66.0 in | Wt 181.0 lb

## 2018-10-20 DIAGNOSIS — E890 Postprocedural hypothyroidism: Secondary | ICD-10-CM | POA: Diagnosis not present

## 2018-10-20 MED ORDER — LEVOTHYROXINE SODIUM 100 MCG PO TABS: 100.0000 ug | ORAL_TABLET | Freq: Every day | ORAL | 6 refills | Status: DC

## 2018-10-20 NOTE — Progress Notes (Signed)
Endocrinology follow-up  Note                                            10/20/2018, 10:19 AM   Subjective:    Patient ID: Isabella Scott, female    DOB: 08/12/1956, PCP Sasser, Silvestre Moment, MD   Past Medical History:  Diagnosis Date  . Anxiety   . Arthritis   . Crohn disease ([place])   . Dysphagia   . GERD (gastroesophageal reflux disease)   . 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 BUNIONECTOMY Left 2000  . BUNIONECTOMY Right 2005  . COLONOSCOPY N/A 2010  . COLONOSCOPY N/A 01/28/2017   Procedure: COLONOSCOPY;  Surgeon: Danie Binder, MD;  [facility];  Service: Endoscopy;  Laterality: N/A;  9:30am  . ESOPHAGOGASTRODUODENOSCOPY N/A 2010   with dilation  . PARTIAL COLECTOMY N/A 1981   terminal ileum resection   Social History   Socioeconomic History  . Marital status: Married    Spouse name: Not on file  . Number of children: Not on file  . Years of education: Not on file  . Highest education level: Not on file  Occupational History  . Not on file  Social Needs  . Financial resource strain: Not on file  . Food insecurity:    Worry: Not on file    Inability: Not on file  . Transportation needs:    Medical: Not on file    Non-medical: Not on file  Tobacco Use  . Smoking status: Former Smoker    Types: Cigarettes    Last attempt to quit: 01/03/1997    Years since quitting: 21.8  . Smokeless tobacco: Never Used  Substance and Sexual Activity  . Alcohol use: No  . Drug use: No  . Sexual activity: Not on file  Lifestyle  . Physical activity:    Days per week: Not on file    Minutes per session: Not on file  . Stress: Not on file  Relationships  . Social connections:    Talks on phone: Not on file    Gets together: Not on file    Attends religious service: Not on file    Active member of club or organization: Not on file    Attends meetings of clubs or organizations: Not on file    Relationship  status: Not on file  Other Topics Concern  . Not on file  Social History Narrative  . Not on file   Outpatient Encounter Medications as of 10/20/2018  Medication Sig  . acetaminophen (TYLENOL) 500 MG tablet Take 500-1,000 mg by mouth daily as needed for moderate pain or headache.  Marland Kitchen aspirin EC 81 MG tablet Take 81 mg by mouth daily.  Marland Kitchen azaTHIOprine (IMURAN) 50 MG tablet Take 50 mg by mouth every evening.   . Calcium Carb-Cholecalciferol (CALCIUM 600+D) 600-800 MG-UNIT TABS Take 1 tablet by mouth daily.  . cetirizine (ZYRTEC) 10 MG tablet Take 10 mg by mouth daily as needed for allergies.  . citalopram (CELEXA) 20 MG tablet Take 40 mg by mouth daily.  . Cyanocobalamin (VITAMIN B-12 IJ) Inject 1 Dose as directed every 30 (thirty) days.   Marland Kitchen esomeprazole (NEXIUM) 40 MG capsule Take 40 mg by mouth daily as needed (indigestion).   Marland Kitchen levothyroxine (SYNTHROID, LEVOTHROID)  100 MCG tablet Take 1 tablet (100 mcg total) by mouth daily before breakfast.  . LORazepam (ATIVAN) 0.5 MG tablet Take 0.5 mg by mouth 2 (two) times daily as needed for anxiety.   . metoprolol tartrate (LOPRESSOR) 25 MG tablet Take 25 mg by mouth daily.   . Multiple Vitamin (MULTIVITAMIN) capsule Take 1 capsule by mouth daily.  . Naphazoline-Pheniramine (OPCON-A OP) Apply 1 drop to eye daily as needed (allergies).  . potassium chloride (K-DUR,KLOR-CON) 10 MEQ tablet Take 10 mEq by mouth 2 (two) times daily.  . predniSONE (DELTASONE) 1 MG tablet Take 4 mg by mouth daily with breakfast.  . Tetrahydrozoline HCl (VISINE OP) Apply 1 drop to eye daily as needed (dry eyes).  . [DISCONTINUED] levothyroxine (SYNTHROID, LEVOTHROID) 75 MCG tablet Take 1 tablet (75 mcg total) by mouth daily before breakfast.   No facility-administered encounter medications on file as of 10/20/2018.    ALLERGIES: Allergies  Allergen Reactions  . Meperidine     Funny feeling  . Morphine And Related     Funny feeling  . Sulfamethoxazole Itching and Rash     VACCINATION STATUS:  There is no immunization history on file for this patient.  HPI Isabella Scott is 62 y.o. female who presents today with a medical history as above. she is status post radioactive iodine thyroid ablation for Graves' disease on Mar 27, 2018.  -She is now on levothyroxine 75 mcg p.o. every morning with clinical and biochemical improvement.  She is compliant to her medications.  -She reports that her previous symptoms have largely subsided.  She has no new complaints today.  She continues to gain weight progressively.    -She denies dysphagia, shortness of breath, change in her voice.  She denies any family history of thyroid cancer.    Review of Systems  Constitutional: + weight gain,  + fatigue, - subjective hyperthermia. Eyes: no blurry vision, no xerophthalmia ENT: no sore throat, no nodules palpated in throat, no dysphagia/odynophagia, no hoarseness Cardiovascular: no chest pain, - palpitations.   Skin: no rashes Neurological: - tremors, no numbness, no tingling, no dizziness Psychiatric: no depression, no anxiety  Objective:    BP 132/82   Pulse 71   Ht 5' 6"  (1.676 m)   Wt 181 lb (82.1 kg)   BMI 29.21 kg/m   Wt Readings from Last 3 Encounters:  10/20/18 181 lb (82.1 kg)  07/17/18 173 lb 12.8 oz (78.8 kg)  05/23/18 172 lb (78 kg)    Physical Exam  Constitutional: + Overweight for height,  + stable state of mind ,  alert and oriented x3.   Eyes: PERRLA, EOMI, + exophthalmos, + lid lag. ENT: moist mucous membranes, + palpable thyromegaly-decreasing, no cervical lymphadenopathy Musculoskeletal: no gross deformities, strength intact in all four extremities Skin: moist, warm, no rashes Neurological: - tremor with outstretched hands, Deep tendon reflexes normal   CMP     Component Value Date/Time   NA 143 01/16/2017 1302   K 4.3 01/16/2017 1302   CL 105 01/16/2017 1302   CO2 24 01/16/2017 1302   GLUCOSE 72 01/16/2017 1302   BUN 6 (L)  01/16/2017 1302   CREATININE 0.88 01/16/2017 1302   CALCIUM 9.4 01/16/2017 1302   PROT 7.4 01/16/2017 1302   ALBUMIN 4.0 01/16/2017 1302   AST 17 01/16/2017 1302   ALT 9 01/16/2017 1302   ALKPHOS 82 01/16/2017 1302   BILITOT 0.2 01/16/2017 1302   Recent Results (from the past 2160 hour(s))  TSH     Status: Abnormal   Collection Time: 10/15/18 12:00 AM  Result Value Ref Range   TSH 33.16 (A) 0.41 - 5.90    Comment: free t4 0.92     May 16, 2018 labs showed TSH <0.006, free T4 1.74  Her thyroid ultrasound on August 15, 2017 showed right lobe 4.3 cm and left lobe 4.1 cm.  Scattered bilateral small nodules, largest on the right lobe 0.5 cm.  Repeat thyroid ultrasound from February 28, 2018 is unremarkable, no nodular lesions.   Mar 19, 2018 thyroid uptake and scan: FINDINGS: Homogeneous tracer distribution throughout both thyroid lobes. No focal areas of increased or decreased tracer localization seen. 4 hour I-123 uptake = 15.7% (normal 5-20%) 24 hour I-123 uptake = 40.2% (normal 10-30%)  IMPRESSION: Normal thyroid scan.  Mildly elevated 24 hour radio iodine uptake of 40.2% consistent with  Hyperthyroidism.  Findings consistent with Graves disease.   Her previsit labs from July 08, 2018 showed significantly elevated TSH of 55.5, significantly lower free T4 at 0.15  Assessment & Plan:   1.  RAI induced hypothyroidism  2.  Graves' disease-resolved status post RAI treatment -Her labs are consistent with under replacement with thyroid hormone.  I discussed and increased her levothyroxine to 100 mcg p.o. every morning with plan to repeat her thyroid function tests in 3 months with office visit.     - We discussed about correct intake of levothyroxine, at fasting, with water, separated by at least 30 minutes from breakfast, and separated by more than 4 hours from calcium, iron, multivitamins, acid reflux medications (PPIs). -Patient is made aware of the fact that thyroid  hormone replacement is needed for life, dose to be adjusted by periodic monitoring of thyroid function tests.  -The metoprolol which was started for tachyarrhythmia significantly prior to her hyperthyroidism will be continued at low-dose 25 mg p.o. daily.     - I advised patient to maintain close follow up with Sasser, Silvestre Moment, MD for primary care needs.  Follow up plan: Return in about 6 months (around 04/21/2019) for Follow up with Pre-visit Labs.   Glade Lloyd, MD [facility] [address] [place], [place] [address] Phone: [phone]  Fax: [phone]     10/20/2018, 10:19 AM  This note was partially dictated with voice recognition software. Similar sounding words can be transcribed inadequately or may not  be corrected upon review.

## 2018-11-18 ENCOUNTER — Telehealth: Payer: Self-pay | Admitting: "Endocrinology

## 2018-11-18 NOTE — Telephone Encounter (Signed)
Pt called and requested to speak to Dr Dorris Fetch. She was hesitant to tell me what was wrong but she finally told me that the Levothyroxine was causing 'Extreme weight gain". She states that none of her clothes fit and wants to know if the medication can be changed. Pt was just here on 10/20/18

## 2018-11-18 NOTE — Telephone Encounter (Signed)
I spoke with the patient. She can do her labs in 4 weeks and visit in 5 weeks,  Sooner than her previously given appointment.

## 2018-12-19 LAB — TSH: TSH: 46.18 — AB (ref 0.41–5.90)

## 2018-12-26 ENCOUNTER — Encounter: Payer: Self-pay | Admitting: "Endocrinology

## 2018-12-26 ENCOUNTER — Ambulatory Visit (INDEPENDENT_AMBULATORY_CARE_PROVIDER_SITE_OTHER): Payer: Medicare Other | Admitting: "Endocrinology

## 2018-12-26 VITALS — BP 149/80 | HR 81 | Ht 66.0 in | Wt 184.2 lb

## 2018-12-26 DIAGNOSIS — E890 Postprocedural hypothyroidism: Secondary | ICD-10-CM

## 2018-12-26 MED ORDER — LEVOTHYROXINE SODIUM 100 MCG PO TABS: 100.0000 ug | ORAL_TABLET | Freq: Every day | ORAL | 6 refills | Status: DC

## 2018-12-26 NOTE — Progress Notes (Signed)
Endocrinology follow-up  Note                                            12/26/2018, 11:23 AM   Subjective:    Patient ID: Isabella Scott, female    DOB: [birth year], PCP Sasser, Silvestre Moment, MD   Past Medical History:  Diagnosis Date  . Anxiety   . Arthritis   . Crohn disease ([place])   . Dysphagia   . GERD (gastroesophageal reflux disease)   . 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 BUNIONECTOMY Left 2000  . BUNIONECTOMY Right 2005  . COLONOSCOPY N/A 2010  . COLONOSCOPY N/A 01/28/2017   Procedure: COLONOSCOPY;  Surgeon: Danie Binder, MD;  [facility];  Service: Endoscopy;  Laterality: N/A;  9:30am  . ESOPHAGOGASTRODUODENOSCOPY N/A 2010   with dilation  . PARTIAL COLECTOMY N/A 1981   terminal ileum resection   Social History   Socioeconomic History  . Marital status: Married    Spouse name: Not on file  . Number of children: Not on file  . Years of education: Not on file  . Highest education level: Not on file  Occupational History  . Not on file  Social Needs  . Financial resource strain: Not on file  . Food insecurity:    Worry: Not on file    Inability: Not on file  . Transportation needs:    Medical: Not on file    Non-medical: Not on file  Tobacco Use  . Smoking status: Former Smoker    Types: Cigarettes    Last attempt to quit: 01/03/1997    Years since quitting: 21.9  . Smokeless tobacco: Never Used  Substance and Sexual Activity  . Alcohol use: No  . Drug use: No  . Sexual activity: Not on file  Lifestyle  . Physical activity:    Days per week: Not on file    Minutes per session: Not on file  . Stress: Not on file  Relationships  . Social connections:    Talks on phone: Not on file    Gets together: Not on file    Attends religious service: Not on file    Active member of club or organization: Not on file    Attends meetings of clubs or organizations: Not on file    Relationship  status: Not on file  Other Topics Concern  . Not on file  Social History Narrative  . Not on file   Outpatient Encounter Medications as of 12/26/2018  Medication Sig  . acetaminophen (TYLENOL) 500 MG tablet Take 500-1,000 mg by mouth daily as needed for moderate pain or headache.  Marland Kitchen aspirin EC 81 MG tablet Take 81 mg by mouth daily.  Marland Kitchen azaTHIOprine (IMURAN) 50 MG tablet Take 50 mg by mouth every evening.   . Calcium Carb-Cholecalciferol (CALCIUM 600+D) 600-800 MG-UNIT TABS Take 1 tablet by mouth daily.  . citalopram (CELEXA) 20 MG tablet Take 40 mg by mouth daily.  . Cyanocobalamin (VITAMIN B-12 IJ) Inject 1 Dose as directed every 30 (thirty) days.   Marland Kitchen levothyroxine (SYNTHROID, LEVOTHROID) 100 MCG tablet Take 1 tablet (100 mcg total) by mouth daily before breakfast.  . LORazepam (ATIVAN) 0.5 MG tablet Take 0.5 mg by mouth 2 (two) times daily as needed for anxiety.   Marland Kitchen  metoprolol tartrate (LOPRESSOR) 25 MG tablet Take 25 mg by mouth daily.   . Multiple Vitamin (MULTIVITAMIN) capsule Take 1 capsule by mouth daily.  . Naphazoline-Pheniramine (OPCON-A OP) Apply 1 drop to eye daily as needed (allergies).  . potassium chloride (K-DUR,KLOR-CON) 10 MEQ tablet Take 10 mEq by mouth 2 (two) times daily.  . predniSONE (DELTASONE) 1 MG tablet Take 4 mg by mouth daily with breakfast.  . Tetrahydrozoline HCl (VISINE OP) Apply 1 drop to eye daily as needed (dry eyes).  . [DISCONTINUED] cetirizine (ZYRTEC) 10 MG tablet Take 10 mg by mouth daily as needed for allergies.  . [DISCONTINUED] esomeprazole (NEXIUM) 40 MG capsule Take 40 mg by mouth daily as needed (indigestion).   . [DISCONTINUED] levothyroxine (SYNTHROID, LEVOTHROID) 100 MCG tablet Take 1 tablet (100 mcg total) by mouth daily before breakfast.   No facility-administered encounter medications on file as of 12/26/2018.    ALLERGIES: Allergies  Allergen Reactions  . Meperidine     Funny feeling  . Morphine And Related     Funny feeling  .  Sulfamethoxazole Itching and Rash    VACCINATION STATUS:  There is no immunization history on file for this patient.  HPI Isabella Scott is 63 y.o. female who presents today with a medical history as above. she is status post radioactive iodine thyroid ablation for Graves' disease on Mar 27, 2018.   -She was advised to take levothyroxine 100 mcg on a daily basis during her last visit.  Unfortunately, patient returns and reports that she has not been consistent taking this medication because she believes that it will cause weight gain to her.  -Her previsit thyroid function tests are consistent with inadequate replacement.  -She reports that her previous symptoms have largely subsided.  She has no new complaints today.  She is reversing the previous weight loss, gained 3 pounds since last visit.    -She denies dysphagia, shortness of breath, change in her voice.  She denies any family history of thyroid cancer.    Review of Systems  Constitutional: + weight gain,  + fatigue, - subjective hyperthermia. Eyes: no blurry vision, no xerophthalmia ENT: no sore throat, no nodules palpated in throat, no dysphagia/odynophagia, no hoarseness Cardiovascular: no chest pain, + palpitations.   Skin: no rashes Neurological: - tremors, no numbness, no tingling, no dizziness Psychiatric: no depression, no anxiety  Objective:    BP (!) 149/80   Pulse 81   Ht 5' 6"  (1.676 m)   Wt 184 lb 3.2 oz (83.6 kg)   BMI 29.73 kg/m   Wt Readings from Last 3 Encounters:  12/26/18 184 lb 3.2 oz (83.6 kg)  10/20/18 181 lb (82.1 kg)  07/17/18 173 lb 12.8 oz (78.8 kg)    Physical Exam  Constitutional: + Overweight for height,  + stable state of mind , alert and oriented x3, not in acute distress.     Eyes: PERRLA, EOMI, + exophthalmos, + lid lag. ENT: moist mucous membranes, + palpable thyromegaly-decreasing, no cervical lymphadenopathy Musculoskeletal: no gross deformities, strength intact in all four  extremities Skin: moist, warm, no rashes Neurological: - tremor with outstretched hands, Deep tendon reflexes normal   CMP     Component Value Date/Time   NA 143 01/16/2017 1302   K 4.3 01/16/2017 1302   CL 105 01/16/2017 1302   CO2 24 01/16/2017 1302   GLUCOSE 72 01/16/2017 1302   BUN 6 (L) 01/16/2017 1302   CREATININE 0.88 01/16/2017 1302   CALCIUM  9.4 01/16/2017 1302   PROT 7.4 01/16/2017 1302   ALBUMIN 4.0 01/16/2017 1302   AST 17 01/16/2017 1302   ALT 9 01/16/2017 1302   ALKPHOS 82 01/16/2017 1302   BILITOT 0.2 01/16/2017 1302   Recent Results (from the past 2160 hour(s))  TSH     Status: Abnormal   Collection Time: 10/15/18 12:00 AM  Result Value Ref Range   TSH 33.16 (A) 0.41 - 5.90    Comment: free t4 0.92  TSH     Status: Abnormal   Collection Time: 12/18/18 12:00 AM  Result Value Ref Range   TSH 46.18 (A) 0.41 - 5.90    Comment: free t4 0.69     May 16, 2018 labs showed TSH <0.006, free T4 1.74  Her thyroid ultrasound on August 15, 2017 showed right lobe 4.3 cm and left lobe 4.1 cm.  Scattered bilateral small nodules, largest on the right lobe 0.5 cm.  Repeat thyroid ultrasound from February 28, 2018 is unremarkable, no nodular lesions.   Mar 19, 2018 thyroid uptake and scan: FINDINGS: Homogeneous tracer distribution throughout both thyroid lobes. No focal areas of increased or decreased tracer localization seen. 4 hour I-123 uptake = 15.7% (normal 5-20%) 24 hour I-123 uptake = 40.2% (normal 10-30%)  IMPRESSION: Normal thyroid scan.  Mildly elevated 24 hour radio iodine uptake of 40.2% consistent with  Hyperthyroidism.  Findings consistent with Graves disease.   Her previsit labs from July 08, 2018 showed significantly elevated TSH of 55.5, significantly lower free T4 at 0.15  Assessment & Plan:   1.  RAI induced hypothyroidism  2.  Graves' disease-resolved status post RAI treatment  -Her labs are consistent with under replacement with thyroid  hormone.,  This is due to the fact that she is not taking her levothyroxine on a daily basis.  She admits that she is skipping her levothyroxine 3 to 4 days a week.   -I had a long discussion with her regarding the need to take her levothyroxine on a daily basis on the recommended dose in order for her to avoid weight gain.  She reluctantly accepts to resume her levothyroxine 100 mcg p.o. every morning.    - We discussed about the correct intake of her thyroid hormone, on empty stomach at fasting, with water, separated by at least 30 minutes from breakfast and other medications,  and separated by more than 4 hours from calcium, iron, multivitamins, acid reflux medications (PPIs). -Patient is made aware of the fact that thyroid hormone replacement is needed for life, dose to be adjusted by periodic monitoring of thyroid function tests.  -The metoprolol which was started for tachyarrhythmia significantly prior to her hyperthyroidism will be continued at low-dose 25 mg p.o. daily.     - I advised patient to maintain close follow up with Sasser, Silvestre Moment, MD for primary care needs.  Follow up plan: Return in about 3 months (around 03/26/2019) for Follow up with Pre-visit Labs.   Glade Lloyd, MD [facility] [address] [place], [place] [address] Phone: [phone]  Fax: [phone]     12/26/2018, 11:23 AM  This note was partially dictated with voice recognition software. Similar sounding words can be transcribed inadequately or may not  be corrected upon review.

## 2018-12-26 NOTE — Progress Notes (Signed)
Isabella Scott, CMA  

## 2019-03-23 LAB — TSH: TSH: 6.5 — AB (ref <=5.90)

## 2019-03-30 ENCOUNTER — Ambulatory Visit: Payer: Medicare Other | Admitting: "Endocrinology

## 2019-04-01 ENCOUNTER — Other Ambulatory Visit: Payer: Self-pay | Admitting: "Endocrinology

## 2019-04-01 ENCOUNTER — Ambulatory Visit (INDEPENDENT_AMBULATORY_CARE_PROVIDER_SITE_OTHER): Payer: Medicare Other | Admitting: "Endocrinology

## 2019-04-01 ENCOUNTER — Encounter: Payer: Self-pay | Admitting: "Endocrinology

## 2019-04-01 ENCOUNTER — Other Ambulatory Visit: Payer: Self-pay

## 2019-04-01 DIAGNOSIS — E890 Postprocedural hypothyroidism: Secondary | ICD-10-CM

## 2019-04-01 MED ORDER — LEVOTHYROXINE SODIUM 100 MCG PO TABS: 100.0000 ug | ORAL_TABLET | Freq: Every day | ORAL | 1 refills | Status: DC

## 2019-04-01 NOTE — Progress Notes (Signed)
04/01/2019, 5:04 PM                                 Endocrinology Telehealth Visit Follow up Note -During COVID -19 Pandemic  I connected with Isabella Scott on 04/01/2019   by telephone and verified that I am speaking with the correct person using two identifiers. Isabella Scott, Dec 12, 1955. she has verbally consented to this visit. All issues noted in this document were discussed and addressed. The format was not optimal for physical exam.     Subjective:    Patient ID: Isabella Scott, female    DOB: 09-Sep-1956, PCP Isabella Scott   Past Medical History:  Diagnosis Date  . Anxiety   . Arthritis   . Crohn disease ([place])   . Dysphagia   . GERD (gastroesophageal reflux disease)   . 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 BUNIONECTOMY Left 2000  . BUNIONECTOMY Right 2005  . COLONOSCOPY N/A 2010  . COLONOSCOPY N/A 01/28/2017   Procedure: COLONOSCOPY;  Surgeon: Danie Binder, Scott;  Location[facility];  Service: Endoscopy;  Laterality: N/A;  9:30am  . ESOPHAGOGASTRODUODENOSCOPY N/A 2010   with dilation  . PARTIAL COLECTOMY N/A 1981   terminal ileum resection   Social History   Socioeconomic History  . Marital status: Married    Spouse name: Not on file  . Number of children: Not on file  . Years of education: Not on file  . Highest education level: Not on file  Occupational History  . Not on file  Social Needs  . Financial resource strain: Not on file  . Food insecurity:    Worry: Not on file    Inability: Not on file  . Transportation needs:    Medical: Not on file    Non-medical: Not on file  Tobacco Use  . Smoking status: Former Smoker    Types: Cigarettes    Last attempt to quit: 01/03/1997    Years since quitting: 22.2  . Smokeless tobacco: Never Used  Substance and Sexual Activity  . Alcohol use: No  . Drug use: No  . Sexual activity: Not on file   Lifestyle  . Physical activity:    Days per week: Not on file    Minutes per session: Not on file  . Stress: Not on file  Relationships  . Social connections:    Talks on phone: Not on file    Gets together: Not on file    Attends religious service: Not on file    Active member of club or organization: Not on file    Attends meetings of clubs or organizations: Not on file    Relationship status: Not on file  Other Topics Concern  . Not on file  Social History Narrative  . Not on file   Outpatient Encounter Medications as of 04/01/2019  Medication Sig  . acetaminophen (TYLENOL) 500 MG tablet Take 500-1,000 mg by mouth daily as needed for moderate pain or headache.  Marland Kitchen aspirin EC 81 MG tablet Take 81 mg by mouth daily.  Marland Kitchen azaTHIOprine (IMURAN) 50 MG  tablet Take 50 mg by mouth every evening.   . Calcium Carb-Cholecalciferol (CALCIUM 600+D) 600-800 MG-UNIT TABS Take 1 tablet by mouth daily.  . citalopram (CELEXA) 20 MG tablet Take 40 mg by mouth daily.  . Cyanocobalamin (VITAMIN B-12 IJ) Inject 1 Dose as directed every 30 (thirty) days.   Marland Kitchen levothyroxine (SYNTHROID) 100 MCG tablet Take 1 tablet (100 mcg total) by mouth daily before breakfast.  . LORazepam (ATIVAN) 0.5 MG tablet Take 0.5 mg by mouth 2 (two) times daily as needed for anxiety.   . metoprolol tartrate (LOPRESSOR) 25 MG tablet Take 25 mg by mouth daily.   . Multiple Vitamin (MULTIVITAMIN) capsule Take 1 capsule by mouth daily.  . Naphazoline-Pheniramine (OPCON-A OP) Apply 1 drop to eye daily as needed (allergies).  . potassium chloride (K-DUR,KLOR-CON) 10 MEQ tablet Take 10 mEq by mouth 2 (two) times daily.  . predniSONE (DELTASONE) 1 MG tablet Take 4 mg by mouth daily with breakfast.  . Tetrahydrozoline HCl (VISINE OP) Apply 1 drop to eye daily as needed (dry eyes).  . [DISCONTINUED] levothyroxine (SYNTHROID, LEVOTHROID) 100 MCG tablet Take 1 tablet (100 mcg total) by mouth daily before breakfast.   No  facility-administered encounter medications on file as of 04/01/2019.    ALLERGIES: Allergies  Allergen Reactions  . Meperidine     Funny feeling  . Morphine And Related     Funny feeling  . Sulfamethoxazole Itching and Rash    VACCINATION STATUS:  There is no immunization history on file for this patient.  HPI Isabella Scott is 63 y.o. female who presents today with a medical history as above. she is status post radioactive iodine thyroid ablation for Graves' disease on Mar 27, 2018.   -She is currently on levothyroxine 100 mcg p.o. daily.  She reports better consistency taking her medication since last visit.   She feels better, has no new complaints today.    -She reports that her previous symptoms have largely subsided.     -She denies dysphagia, shortness of breath, change in her voice.  She denies any family history of thyroid cancer.    Review of Systems -Limited as above.    Objective:    There were no vitals taken for this visit.  Wt Readings from Last 3 Encounters:  12/26/18 184 lb 3.2 oz (83.6 kg)  10/20/18 181 lb (82.1 kg)  07/17/18 173 lb 12.8 oz (78.8 kg)    Physical Exam   CMP     Component Value Date/Time   NA 143 01/16/2017 1302   K 4.3 01/16/2017 1302   CL 105 01/16/2017 1302   CO2 24 01/16/2017 1302   GLUCOSE 72 01/16/2017 1302   BUN 6 (L) 01/16/2017 1302   CREATININE 0.88 01/16/2017 1302   CALCIUM 9.4 01/16/2017 1302   PROT 7.4 01/16/2017 1302   ALBUMIN 4.0 01/16/2017 1302   AST 17 01/16/2017 1302   ALT 9 01/16/2017 1302   ALKPHOS 82 01/16/2017 1302   BILITOT 0.2 01/16/2017 1302   Recent Results (from the past 2160 hour(s))  TSH     Status: Abnormal   Collection Time: 03/23/19 12:00 AM  Result Value Ref Range   TSH 6.50 (A) 0.41 - 5.90     May 16, 2018 labs showed TSH <0.006, free T4 1.74  Her thyroid ultrasound on August 15, 2017 showed right lobe 4.3 cm and left lobe 4.1 cm.  Scattered bilateral small nodules, largest on the  right lobe 0.5 cm.  Repeat  thyroid ultrasound from February 28, 2018 is unremarkable, no nodular lesions.   Assessment & Plan:   1.  RAI induced hypothyroidism  2.  Graves' disease-resolved status post RAI treatment  -Her labs are improving and consistent with appropriate replacement.  She is advised to continue levothyroxine 100 mcg p.o. every morning.    - We discussed about the correct intake of her thyroid hormone, on empty stomach at fasting, with water, separated by at least 30 minutes from breakfast and other medications,  and separated by more than 4 hours from calcium, iron, multivitamins, acid reflux medications (PPIs). -Patient is made aware of the fact that thyroid hormone replacement is needed for life, dose to be adjusted by periodic monitoring of thyroid function tests.  -The metoprolol which was started for tachyarrhythmia significantly prior to her hyperthyroidism will be continued at low-dose 25 mg p.o. daily.     - I advised patient to maintain close follow up with Isabella Scott for primary care needs.  Time for this visit 15 minutes.  Isabella Scott participated in the discussions, expressed understanding, and voiced agreement with the above plans.  All questions were answered to her satisfaction. she is encouraged to contact clinic should she have any questions or concerns prior to her return visit.   Follow up plan: Return in about 6 months (around 10/02/2019) for Follow up with Pre-visit Labs.   Glade Lloyd, Scott [facility] [address] [place], [place] [address] Phone: [phone]  Fax: [phone]     04/01/2019, 5:04 PM  This note was partially dictated with voice recognition software. Similar sounding words can be transcribed inadequately or may not  be corrected upon review.

## 2019-04-21 ENCOUNTER — Ambulatory Visit: Payer: Medicare Other | Admitting: "Endocrinology

## 2019-08-27 ENCOUNTER — Other Ambulatory Visit: Payer: Self-pay

## 2019-08-27 ENCOUNTER — Ambulatory Visit: Payer: Medicare Other | Admitting: Internal Medicine

## 2019-08-27 ENCOUNTER — Encounter: Payer: Self-pay | Admitting: Internal Medicine

## 2019-08-27 VITALS — BP 148/80 | HR 84 | Temp 98.7°F | Ht 66.0 in | Wt 191.8 lb

## 2019-08-27 DIAGNOSIS — E039 Hypothyroidism, unspecified: Secondary | ICD-10-CM

## 2019-08-27 LAB — TSH: TSH: 2.84 u[IU]/mL (ref 0.35–4.50)

## 2019-08-27 LAB — T4, FREE: Free T4: 1.01 ng/dL (ref 0.60–1.60)

## 2019-08-27 NOTE — Progress Notes (Signed)
Name: Isabella Scott  MRN/ DOB: [id number], 04-13-1956    Age/ Sex: 63 y.o., female    PCP: Manon Hilding, MD   Reason for Endocrinology Evaluation: Post-ablative Hypothyroidism      Date of Initial Endocrinology Evaluation: 08/27/2019     HPI: Ms. Isabella Scott is a 63 y.o. female with a past medical history of Hx of Graves's Disease, Crohn's disease and depression with Anxiety.  The patient presented for initial endocrinology clinic visit on 08/27/2019 for consultative assistance with her Post-ablative Hypothyroidism .   Pt with hx of hyperthyroidism secondary to graves' Disease , she is S/P RAI ablation on 5/16/2019with  12.4 mCi I-131 sodium iodide orally. Pt is not aware of the diagnosis of graves' disease.   By August, 2019 she became hypothyroid with a TSH of 55.0 uIU/mL and was started on LT-4 replacement.   Her main complaint today is her weight gain that she noted since starting LT-4 replacement.    She denies any local neck symptoms.  She endorses heat intolernce and hip pain. No diarrhea or constipation at this time.  Has depression and anxiety   No local neck symptoms.   She admits to non-compliance with LT-4 replacement because she believes this is causing her weight gain.  When she takes it , its in the morning on an empty but she takes calcium with breakfast.      HISTORY:  Past Medical History:  Past Medical History:  Diagnosis Date  . Anxiety   . Arthritis   . Crohn disease ([place])   . Dysphagia   . GERD (gastroesophageal reflux disease)   . 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 BUNIONECTOMY Left 2000  . BUNIONECTOMY Right 2005  . COLONOSCOPY N/A 2010  . COLONOSCOPY N/A 01/28/2017   Procedure: COLONOSCOPY;  Surgeon: Danie Binder, MD;  [facility];  Service: Endoscopy;  Laterality: N/A;  9:30am  . ESOPHAGOGASTRODUODENOSCOPY N/A 2010   with dilation  .  PARTIAL COLECTOMY N/A 1981   terminal ileum resection      Social History:  reports that she quit smoking about 22 years ago. Her smoking use included cigarettes. She has never used smokeless tobacco. She reports that she does not drink alcohol or use drugs.  Family History: family history includes Heart disease in her brother and father; Kidney disease in her brother; Lung cancer in her maternal grandfather; Throat cancer in her maternal uncle.   HOME MEDICATIONS: Allergies as of 08/27/2019      Reactions   Meperidine    Funny feeling   Morphine And Related    Funny feeling   Sulfamethoxazole Itching, Rash      Medication List       Accurate as of August 27, 2019 10:00 AM. If you have any questions, ask your nurse or doctor.        acetaminophen 500 MG tablet Commonly known as: TYLENOL Take 500-1,000 mg by mouth daily as needed for moderate pain or headache.   aspirin EC 81 MG tablet Take 81 mg by mouth daily.   azaTHIOprine 50 MG tablet Commonly known as: IMURAN Take 50 mg by mouth every evening.   buPROPion 150 MG 12 hr tablet Commonly known as: WELLBUTRIN SR TK 1 T PO QD FOR 2 WEEKS THEN TK 1 T PO BID   Calcium 600+D 600-800 MG-UNIT Tabs Generic drug: Calcium Carb-Cholecalciferol Take 1  tablet by mouth daily.   citalopram 20 MG tablet Commonly known as: CELEXA Take 40 mg by mouth daily.   esomeprazole 40 MG capsule Commonly known as: NEXIUM Take 40 mg by mouth daily at 12 noon.   levothyroxine 125 MCG tablet Commonly known as: SYNTHROID Take 125 mcg by mouth daily before breakfast. What changed: Another medication with the same name was removed. Continue taking this medication, and follow the directions you see here. Changed by: Dorita Sciara, MD   LORazepam 0.5 MG tablet Commonly known as: ATIVAN Take 0.5 mg by mouth 2 (two) times daily as needed for anxiety.   metoprolol tartrate 25 MG tablet Commonly known as: LOPRESSOR Take 25 mg by  mouth daily.   multivitamin capsule Take 1 capsule by mouth daily.   OPCON-A OP Apply 1 drop to eye daily as needed (allergies).   potassium chloride 10 MEQ tablet Commonly known as: KLOR-CON Take 10 mEq by mouth 2 (two) times daily.   predniSONE 1 MG tablet Commonly known as: DELTASONE Take 4 mg by mouth daily with breakfast.   SINUS & ALLERGY PO Take by mouth.   valACYclovir 500 MG tablet Commonly known as: VALTREX Take 500 mg by mouth 2 (two) times daily.   VISINE OP Apply 1 drop to eye daily as needed (dry eyes).   VITAMIN B-12 IJ Inject 1 Dose as directed every 30 (thirty) days.         REVIEW OF SYSTEMS: A comprehensive ROS was conducted with the patient and is negative except as per HPI and below:  Review of Systems  Constitutional: Positive for malaise/fatigue. Negative for weight loss.  Respiratory: Negative for cough and shortness of breath.   Cardiovascular: Negative for chest pain and palpitations.  Gastrointestinal: Negative for constipation, diarrhea and nausea.  Genitourinary: Negative for frequency.  Neurological: Negative for tingling and tremors.  Endo/Heme/Allergies: Negative for polydipsia.  Psychiatric/Behavioral: Positive for depression. The patient is nervous/anxious.        OBJECTIVE:  VS: BP (!) 148/80 (BP Location: Right Arm, Patient Position: Sitting, Cuff Size: Normal)   Pulse 84   Temp 98.7 F (37.1 C)   Ht 5' 6"  (1.676 m)   Wt 191 lb 12.8 oz (87 kg)   SpO2 98%   BMI 30.96 kg/m    Wt Readings from Last 3 Encounters:  08/27/19 191 lb 12.8 oz (87 kg)  12/26/18 184 lb 3.2 oz (83.6 kg)  10/20/18 181 lb (82.1 kg)     EXAM: General: Pt appears well and is in NAD  Eyes: External eye exam normal without stare, lid lag or exophthalmos.  EOM intact.    Ears, Nose, Throat: Hearing: Grossly intact bilaterally Throat: Clear without mass, erythema or exudate  Neck: General: Supple without adenopathy. Thyroid: Thyroid size normal.   No goiter or nodules appreciated. No thyroid bruit.  Lungs: Clear with good BS bilat with no rales, rhonchi, or wheezes  Heart: Auscultation: RRR.  Abdomen: Normoactive bowel sounds, soft, nontender, without masses or organomegaly palpable  Extremities: BL LE: No pretibial edema normal ROM and strength.  Skin: Hair: Texture and amount normal with gender appropriate distribution Skin Inspection: No rashes Skin Palpation: Skin temperature, texture, and thickness normal to palpation  Neuro: Cranial nerves: II - XII grossly intact  Motor: Normal strength throughout DTRs: 2+ and symmetric in UE without delay in relaxation phase  Mental Status: Judgment, insight: Intact Orientation: Oriented to time, place, and person Mood and affect: No depression, anxiety, or  agitation     DATA REVIEWED: Results for AAYUSHI, SOLORZANO (MRN [id number]) as of 08/28/2019 12:58  Ref. Range 08/27/2019 09:54  TSH Latest Ref Range: 0.35 - 4.50 uIU/mL 2.84  T4,Free(Direct) Latest Ref Range: 0.60 - 1.60 ng/dL 1.01      ASSESSMENT/PLAN/RECOMMENDATIONS:   1. Postablative Hypothyroidism:   - Pt with multiple non-specific symptoms  - No local neck symptoms  - She is under the impression that levothyroxine is causing weight gain, we did discuss the importance of LT-4 replacement in optimizing the rate of metabolism, heart rate, bone health etc. I have explained to her that as long as her TFT's are normal, her weigh gain is NOT related to levothyroxine but rather increased calorie intake, hereditary causes, citalopram  Or prednisone.  - We also discussed that in the past, these medications did not affect her because her metabolic rate was hyper given hyperthyroid status but now that her thyroid function is being optimized this would lead to normalization of metabolic rate and will notice effects that she didn't in the past.  - I have explained to her the compliance of LT-4 replacement is important , otherwise she is at  risk for myxedema coma.   - Pt educated extensively on the correct way to take levothyroxine (first thing in the morning with water, 30 minutes before eating or taking other medications). - Pt encouraged to double dose the following day if she were to miss a dose given long half-life of levothyroxine.    Medications : Levothyroxine 125 mcg daily    F/u in 4 months   Signed electronically by: Mack Guise, MD  [facility]  [facility] [address] [place], [place] [address] Phone: [phone] FAX: [phone]   CC: Manon Hilding, MD [place] [address] Phone: [phone] Fax: [phone]   Return to Endocrinology clinic as below: Future Appointments  Date Time Provider [place]  10/02/2019 11:30 AM Cassandria Anger, MD [facility] None  12/29/2019  8:30 AM Bekah Igoe, Melanie Crazier, MD [facility] None

## 2019-08-27 NOTE — Patient Instructions (Signed)

## 2019-08-28 ENCOUNTER — Encounter: Payer: Self-pay | Admitting: Internal Medicine

## 2019-09-01 ENCOUNTER — Telehealth: Payer: Self-pay

## 2019-09-01 NOTE — Telephone Encounter (Addendum)
Spoke to the patient and made her aware the Dr. Kelton Pillar is on vacation and has not resulted her labs yet-can you please advise in Dr. Nathaneil Canary absence

## 2019-09-01 NOTE — Telephone Encounter (Signed)
Patient called in wanting to know about her blood work results    Please advise

## 2019-09-02 ENCOUNTER — Encounter: Payer: Self-pay | Admitting: Nutrition

## 2019-09-02 ENCOUNTER — Other Ambulatory Visit: Payer: Self-pay

## 2019-09-02 ENCOUNTER — Encounter: Payer: Medicare Other | Attending: Family Medicine | Admitting: Nutrition

## 2019-09-02 VITALS — Ht 66.0 in | Wt 184.0 lb

## 2019-09-02 DIAGNOSIS — K50019 Crohn's disease of small intestine with unspecified complications: Secondary | ICD-10-CM | POA: Insufficient documentation

## 2019-09-02 DIAGNOSIS — R635 Abnormal weight gain: Secondary | ICD-10-CM | POA: Insufficient documentation

## 2019-09-02 NOTE — Telephone Encounter (Signed)
Thyroid labs are normal

## 2019-09-02 NOTE — Patient Instructions (Addendum)
Follow up in Dec  Goals Eat meals on time  Follow My Plate  Cut out junk food, sweets Dont' skip meal  Drink only water Lose 1-2 lbs per week.

## 2019-09-02 NOTE — Progress Notes (Signed)
  Medical Nutrition Therapy:  Appt start time: 1030 end time:  1130.   Assessment:  Primary concerns today: Weight gain, Thyroid problems. Marland Kitchen LIves with her husband. She shops for food and he cooks. Complains of no energy, depressed about recent weight gain of 30 lbs. Can't fit into any clothes.  Helps looks after her mom.  Gained 30 lbs in the last year. Says she is depressed. Her UBW 165 lbs or so last year. Has recently hurt her left foot. It is in a boot.  Has swelling on and off foot. Has torn aligamnt in her left leg. Will be in boot for 3 week. Usually use to walk a lot.  Current diet is excessive in fat, empty calories, sugar/ CHO and processed foods. Diet is low in fresh fruits, vegetables and whole grains. HIstory of IBS/Crohns. Can't eat salads. Willing to work on [employer] and get rid of junk food and eat healthier. Current weight gain appears to be more related to poor food choices high in fat, sodum an Vitals with BMI 09/02/2019 08/27/2019  Height 5' 6"  5' 6"   Weight 184 lbs 191 lbs 13 oz  BMI 79.15 05.69  Systolic  794  Diastolic  80  Pulse  84  d CHO   CMP Latest Ref Rng & Units 01/16/2017  Glucose 65 - 99 mg/dL 72  BUN 7 - 25 mg/dL 6(L)  Creatinine 0.50 - 1.05 mg/dL 0.88  Sodium 135 - 146 mmol/L 143  Potassium 3.5 - 5.3 mmol/L 4.3  Chloride 98 - 110 mmol/L 105  CO2 20 - 31 mmol/L 24  Calcium 8.6 - 10.4 mg/dL 9.4  Total Protein 6.1 - 8.1 g/dL 7.4  Total Bilirubin 0.2 - 1.2 mg/dL 0.2  Alkaline Phos 33 - 130 U/L 82  AST 10 - 35 U/L 17  ALT 6 - 29 U/L 9     Preferred Learning Style:  Loves to read.  No preference indicated   Learning Readiness:  Ready  Change in progress   MEDICATIONS:    DIETARY INTAKE:  24-hr recall:  B ( AM): Boiiled egg and spam  Snk ( AM):  L ( PM):  Pizza with string beans, slice tomato, Flavored water Snk ( PM): D ( PM): bowl of cheese doodles, 6 pieces candy, carmel creams, 5,  Snk ( PM): Beverages: water, flavored  water,   Usual physical activity: ADL due to foot issues right now.  Estimated energy needs: 1200 calories 130 g carbohydrates 90 g protein 33 g fat  Progress Towards Goal(s):  In progress.   Nutritional Diagnosis:  NI-1.5 Excessive energy intake As related to High fat, high sugar, high salt diet.  As evidenced by 30 lbs weight gain and BMI > 30.    Intervention:  My Plate, portion sizes, meal planning. Avoiding snacks, Timing of meals, cutting out processed and junk food. HIgh Fiber, Low Fat, Low sugar diet. Healthy weight loss tips.  Follow up 1 month  Goals Eat meals on time  Follow My Plate  Cut out junk food, sweets Dont' skip meal  Drink only water Lose 1-2 lbs per week.   Teaching Method Utilized:  Visual Auditory Hands on  Handouts given during visit include:  Meal  Plan Card  The Plate Method  Barriers to learning/adherence to lifestyle change: none  Demonstrated degree of understanding via:  Teach Back   Monitoring/Evaluation:  Dietary intake, exercise, , and body weight in 1 month(s).

## 2019-09-03 NOTE — Telephone Encounter (Signed)
LVM making patient aware that her labs were normal

## 2019-09-14 ENCOUNTER — Encounter: Payer: Self-pay | Admitting: Nutrition

## 2019-09-14 ENCOUNTER — Telehealth: Payer: Self-pay | Admitting: Nutrition

## 2019-09-14 NOTE — Telephone Encounter (Signed)
VM left from pt on 09-11-19 stating she is staying hungry and can't follow meal plan. Is frustrated and stays hungry. Left VM to call me back to discuss issues over the phone.

## 2019-10-01 ENCOUNTER — Encounter: Payer: Self-pay | Admitting: "Endocrinology

## 2019-10-01 ENCOUNTER — Other Ambulatory Visit: Payer: Self-pay

## 2019-10-01 ENCOUNTER — Ambulatory Visit (INDEPENDENT_AMBULATORY_CARE_PROVIDER_SITE_OTHER): Payer: Medicare Other | Admitting: "Endocrinology

## 2019-10-01 DIAGNOSIS — E890 Postprocedural hypothyroidism: Secondary | ICD-10-CM

## 2019-10-01 NOTE — Progress Notes (Signed)
10/01/2019, 5:24 PM                                 Endocrinology Telehealth Visit Follow up Note -During COVID -19 Pandemic  I connected with Isabella Scott on 10/01/2019   by telephone and verified that I am speaking with the correct person using two identifiers. Isabella Scott, 1956/06/02. she has verbally consented to this visit. All issues noted in this document were discussed and addressed. The format was not optimal for physical exam.     Subjective:    Patient ID: Isabella Scott, female    DOB: [birth year], PCP Sasser, Silvestre Moment, MD   Past Medical History:  Diagnosis Date  . Anxiety   . Arthritis   . Crohn disease ([place])   . Dysphagia   . GERD (gastroesophageal reflux disease)   . 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 BUNIONECTOMY Left 2000  . BUNIONECTOMY Right 2005  . COLONOSCOPY N/A 2010  . COLONOSCOPY N/A 01/28/2017   Procedure: COLONOSCOPY;  Surgeon: Danie Binder, MD;  [facility];  Service: Endoscopy;  Laterality: N/A;  9:30am  . ESOPHAGOGASTRODUODENOSCOPY N/A 2010   with dilation  . PARTIAL COLECTOMY N/A 1981   terminal ileum resection   Social History   Socioeconomic History  . Marital status: Married    Spouse name: Not on file  . Number of children: Not on file  . Years of education: Not on file  . Highest education level: Not on file  Occupational History  . Not on file  Social Needs  . Financial resource strain: Not on file  . Food insecurity    Worry: Not on file    Inability: Not on file  . Transportation needs    Medical: Not on file    Non-medical: Not on file  Tobacco Use  . Smoking status: Former Smoker    Types: Cigarettes    Quit date: 01/03/1997    Years since quitting: 22.7  . Smokeless tobacco: Never Used  Substance and Sexual Activity  . Alcohol use: No  . Drug use: No  . Sexual activity: Not on file  Lifestyle   . Physical activity    Days per week: Not on file    Minutes per session: Not on file  . Stress: Not on file  Relationships  . Social Herbalist on phone: Not on file    Gets together: Not on file    Attends religious service: Not on file    Active member of club or organization: Not on file    Attends meetings of clubs or organizations: Not on file    Relationship status: Not on file  Other Topics Concern  . Not on file  Social History Narrative  . Not on file   Outpatient Encounter Medications as of 10/01/2019  Medication Sig  . acetaminophen (TYLENOL) 500 MG tablet Take 500-1,000 mg by mouth daily as needed for moderate pain or headache.  Marland Kitchen aspirin EC 81 MG tablet Take 81 mg by mouth daily.  Marland Kitchen azaTHIOprine (IMURAN) 50 MG tablet Take  50 mg by mouth every evening.   Marland Kitchen buPROPion (WELLBUTRIN SR) 150 MG 12 hr tablet TK 1 T PO QD FOR 2 WEEKS THEN TK 1 T PO BID  . Calcium Carb-Cholecalciferol (CALCIUM 600+D) 600-800 MG-UNIT TABS Take 1 tablet by mouth daily.  . Chlorpheniramine-Phenylephrine (SINUS & ALLERGY PO) Take by mouth.  . citalopram (CELEXA) 20 MG tablet Take 40 mg by mouth daily.  . Cyanocobalamin (VITAMIN B-12 IJ) Inject 1 Dose as directed every 30 (thirty) days.   Marland Kitchen esomeprazole (NEXIUM) 40 MG capsule Take 40 mg by mouth daily at 12 noon.  Marland Kitchen levothyroxine (SYNTHROID) 125 MCG tablet Take 125 mcg by mouth daily before breakfast.  . LORazepam (ATIVAN) 0.5 MG tablet Take 0.5 mg by mouth 2 (two) times daily as needed for anxiety.   . metoprolol tartrate (LOPRESSOR) 25 MG tablet Take 25 mg by mouth daily.   . Multiple Vitamin (MULTIVITAMIN) capsule Take 1 capsule by mouth daily.  . Naphazoline-Pheniramine (OPCON-A OP) Apply 1 drop to eye daily as needed (allergies).  . potassium chloride (K-DUR,KLOR-CON) 10 MEQ tablet Take 10 mEq by mouth 2 (two) times daily.  . predniSONE (DELTASONE) 1 MG tablet Take 4 mg by mouth daily with breakfast.  . Tetrahydrozoline HCl (VISINE  OP) Apply 1 drop to eye daily as needed (dry eyes).  . valACYclovir (VALTREX) 500 MG tablet Take 500 mg by mouth 2 (two) times daily.   No facility-administered encounter medications on file as of 10/01/2019.    ALLERGIES: Allergies  Allergen Reactions  . Meperidine     Funny feeling  . Morphine And Related     Funny feeling  . Sulfamethoxazole Itching and Rash    VACCINATION STATUS:  There is no immunization history on file for this patient.  HPI Isabella Scott is 63 y.o. female who presents today with a medical history as above. she is status post radioactive iodine thyroid ablation for Graves' disease on Mar 27, 2018.   -She is currently on levothyroxine replacement.  Since her last visit her levothyroxine was adjusted to 125 mcg p.o. daily by her PMD.   Her most recent thyroid function tests are shot TSH 6.36, with free T4 of 1.27.    She reports better consistency taking her medication since last visit.   She feels better, has no new complaints today.    -She reports that her previous symptoms have largely subsided.     -She denies dysphagia, shortness of breath, change in her voice.  She denies any family history of thyroid cancer.    Review of Systems -Limited as above.    Objective:    There were no vitals taken for this visit.  Wt Readings from Last 3 Encounters:  09/02/19 184 lb (83.5 kg)  08/27/19 191 lb 12.8 oz (87 kg)  12/26/18 184 lb 3.2 oz (83.6 kg)    Physical Exam   CMP     Component Value Date/Time   NA 143 01/16/2017 1302   K 4.3 01/16/2017 1302   CL 105 01/16/2017 1302   CO2 24 01/16/2017 1302   GLUCOSE 72 01/16/2017 1302   BUN 6 (L) 01/16/2017 1302   CREATININE 0.88 01/16/2017 1302   CALCIUM 9.4 01/16/2017 1302   PROT 7.4 01/16/2017 1302   ALBUMIN 4.0 01/16/2017 1302   AST 17 01/16/2017 1302   ALT 9 01/16/2017 1302   ALKPHOS 82 01/16/2017 1302   BILITOT 0.2 01/16/2017 1302   Recent Results (from the past 2160 hour(s))  TSH      Status: None   Collection Time: 08/27/19  9:54 AM  Result Value Ref Range   TSH 2.84 0.35 - 4.50 uIU/mL  T4, free     Status: None   Collection Time: 08/27/19  9:54 AM  Result Value Ref Range   Free T4 1.01 0.60 - 1.60 ng/dL    Comment: Specimens from patients who are undergoing biotin therapy and /or ingesting biotin supplements may contain high levels of biotin.  The higher biotin concentration in these specimens interferes with this Free T4 assay.  Specimens that contain high levels  of biotin may cause false high results for this Free T4 assay.  Please interpret results in light of the total clinical presentation of the patient.       May 16, 2018 labs showed TSH <0.006, free T4 1.74  Her thyroid ultrasound on August 15, 2017 showed right lobe 4.3 cm and left lobe 4.1 cm.  Scattered bilateral small nodules, largest on the right lobe 0.5 cm.  Repeat thyroid ultrasound from February 28, 2018 is unremarkable, no nodular lesions.   Assessment & Plan:   1.  RAI induced hypothyroidism  2.  Graves' disease-resolved status post RAI treatment  - She is advised to continue levothyroxine 125 mcg p.o. daily before breakfast.   - We discussed about the correct intake of her thyroid hormone, on empty stomach at fasting, with water, separated by at least 30 minutes from breakfast and other medications,  and separated by more than 4 hours from calcium, iron, multivitamins, acid reflux medications (PPIs). -Patient is made aware of the fact that thyroid hormone replacement is needed for life, dose to be adjusted by periodic monitoring of thyroid function tests.  -The metoprolol which was started for tachyarrhythmia significantly prior to her hyperthyroidism will be continued at low-dose 25 mg p.o. daily.     - I advised patient to maintain close follow up with Sasser, Silvestre Moment, MD for primary care needs.   Time for this visit: 15 minutes. Isabella Scott  participated in the discussions, expressed  understanding, and voiced agreement with the above plans.  All questions were answered to her satisfaction. she is encouraged to contact clinic should she have any questions or concerns prior to her return visit.   Follow up plan: Return in about 8 weeks (around 11/26/2019) for Follow up with Pre-visit Labs.   Glade Lloyd, MD [facility] [address] [place], [place] [address] Phone: [phone]  Fax: [phone]     10/01/2019, 5:24 PM  This note was partially dictated with voice recognition software. Similar sounding words can be transcribed inadequately or may not  be corrected upon review.

## 2019-10-02 ENCOUNTER — Ambulatory Visit: Payer: Medicare Other | Admitting: "Endocrinology

## 2019-10-22 ENCOUNTER — Ambulatory Visit: Payer: Medicare Other | Admitting: Nutrition

## 2019-11-16 DIAGNOSIS — S96912D Strain of unspecified muscle and tendon at ankle and foot level, left foot, subsequent encounter: Secondary | ICD-10-CM | POA: Diagnosis not present

## 2019-11-16 DIAGNOSIS — M25572 Pain in left ankle and joints of left foot: Secondary | ICD-10-CM | POA: Diagnosis not present

## 2019-11-26 DIAGNOSIS — F411 Generalized anxiety disorder: Secondary | ICD-10-CM | POA: Diagnosis not present

## 2019-11-26 DIAGNOSIS — F410 Panic disorder [episodic paroxysmal anxiety] without agoraphobia: Secondary | ICD-10-CM | POA: Diagnosis not present

## 2019-11-26 DIAGNOSIS — Z6830 Body mass index (BMI) 30.0-30.9, adult: Secondary | ICD-10-CM | POA: Diagnosis not present

## 2019-11-26 LAB — TSH: TSH: 7.78 — AB (ref 0.41–5.90)

## 2019-12-01 ENCOUNTER — Ambulatory Visit: Payer: Medicare Other | Admitting: "Endocrinology

## 2019-12-01 ENCOUNTER — Encounter: Payer: Self-pay | Admitting: "Endocrinology

## 2019-12-01 ENCOUNTER — Ambulatory Visit (INDEPENDENT_AMBULATORY_CARE_PROVIDER_SITE_OTHER): Payer: Medicare PPO | Admitting: "Endocrinology

## 2019-12-01 DIAGNOSIS — E890 Postprocedural hypothyroidism: Secondary | ICD-10-CM | POA: Diagnosis not present

## 2019-12-01 MED ORDER — LEVOTHYROXINE SODIUM 137 MCG PO TABS: 137.0000 ug | ORAL_TABLET | Freq: Every day | ORAL | 3 refills | Status: DC

## 2019-12-01 NOTE — Progress Notes (Signed)
12/01/2019, 10:22 AM                                 Endocrinology Telehealth Visit Follow up Note -During COVID -19 Pandemic  I connected with Isabella Scott on 12/01/2019   by telephone and verified that I am speaking with the correct person using two identifiers. Isabella Scott, 1956/10/19. she has verbally consented to this visit. All issues noted in this document were discussed and addressed. The format was not optimal for physical exam.     Subjective:    Patient ID: Isabella Scott, female    DOB: [birth year], PCP Sasser, Silvestre Moment, MD   Past Medical History:  Diagnosis Date  . Anxiety   . Arthritis   . Crohn disease ([place])   . Dysphagia   . GERD (gastroesophageal reflux disease)   . 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 BUNIONECTOMY Left 2000  . BUNIONECTOMY Right 2005  . COLONOSCOPY N/A 2010  . COLONOSCOPY N/A 01/28/2017   Procedure: COLONOSCOPY;  Surgeon: Danie Binder, MD;  [facility];  Service: Endoscopy;  Laterality: N/A;  9:30am  . ESOPHAGOGASTRODUODENOSCOPY N/A 2010   with dilation  . PARTIAL COLECTOMY N/A 1981   terminal ileum resection   Social History   Socioeconomic History  . Marital status: Married    Spouse name: Not on file  . Number of children: Not on file  . Years of education: Not on file  . Highest education level: Not on file  Occupational History  . Not on file  Tobacco Use  . Smoking status: Former Smoker    Types: Cigarettes    Quit date: 01/03/1997    Years since quitting: 22.9  . Smokeless tobacco: Never Used  Substance and Sexual Activity  . Alcohol use: No  . Drug use: No  . Sexual activity: Not on file  Other Topics Concern  . Not on file  Social History Narrative  . Not on file   Social Determinants of Health   Financial Resource Strain:   . Difficulty of Paying Living Expenses: Not on file  Food Insecurity:    . Worried About Charity fundraiser in the Last Year: Not on file  . Ran Out of Food in the Last Year: Not on file  Transportation Needs:   . Lack of Transportation (Medical): Not on file  . Lack of Transportation (Non-Medical): Not on file  Physical Activity:   . Days of Exercise per Week: Not on file  . Minutes of Exercise per Session: Not on file  Stress:   . Feeling of Stress : Not on file  Social Connections:   . Frequency of Communication with Friends and Family: Not on file  . Frequency of Social Gatherings with Friends and Family: Not on file  . Attends Religious Services: Not on file  . Active Member of Clubs or Organizations: Not on file  . Attends Archivist Meetings: Not on file  . Marital Status: Not on file   Outpatient Encounter Medications as of 12/01/2019  Medication Sig  . acetaminophen (TYLENOL) 500  MG tablet Take 500-1,000 mg by mouth daily as needed for moderate pain or headache.  Marland Kitchen aspirin EC 81 MG tablet Take 81 mg by mouth daily.  Marland Kitchen azaTHIOprine (IMURAN) 50 MG tablet Take 50 mg by mouth every evening.   Marland Kitchen buPROPion (WELLBUTRIN SR) 150 MG 12 hr tablet TK 1 T PO QD FOR 2 WEEKS THEN TK 1 T PO BID  . Calcium Carb-Cholecalciferol (CALCIUM 600+D) 600-800 MG-UNIT TABS Take 1 tablet by mouth daily.  . Chlorpheniramine-Phenylephrine (SINUS & ALLERGY PO) Take by mouth.  . citalopram (CELEXA) 20 MG tablet Take 40 mg by mouth daily.  . Cyanocobalamin (VITAMIN B-12 IJ) Inject 1 Dose as directed every 30 (thirty) days.   Marland Kitchen esomeprazole (NEXIUM) 40 MG capsule Take 40 mg by mouth daily at 12 noon.  Marland Kitchen levothyroxine (SYNTHROID) 137 MCG tablet Take 1 tablet (137 mcg total) by mouth daily before breakfast.  . LORazepam (ATIVAN) 0.5 MG tablet Take 0.5 mg by mouth 2 (two) times daily as needed for anxiety.   . metoprolol tartrate (LOPRESSOR) 25 MG tablet Take 25 mg by mouth daily.   . Multiple Vitamin (MULTIVITAMIN) capsule Take 1 capsule by mouth daily.  .  Naphazoline-Pheniramine (OPCON-A OP) Apply 1 drop to eye daily as needed (allergies).  . potassium chloride (K-DUR,KLOR-CON) 10 MEQ tablet Take 10 mEq by mouth 2 (two) times daily.  . predniSONE (DELTASONE) 1 MG tablet Take 4 mg by mouth daily with breakfast.  . Tetrahydrozoline HCl (VISINE OP) Apply 1 drop to eye daily as needed (dry eyes).  . valACYclovir (VALTREX) 500 MG tablet Take 500 mg by mouth 2 (two) times daily.  . [DISCONTINUED] levothyroxine (SYNTHROID) 125 MCG tablet Take 125 mcg by mouth daily before breakfast.   No facility-administered encounter medications on file as of 12/01/2019.   ALLERGIES: Allergies  Allergen Reactions  . Meperidine     Funny feeling  . Morphine And Related     Funny feeling  . Sulfamethoxazole Itching and Rash    VACCINATION STATUS: Immunization History  Administered Date(s) Administered  . Influenza-Unspecified 08/12/2018    HPI Isabella Scott is 64 y.o. female who presents today with a medical history as above. she is status post radioactive iodine thyroid ablation for Graves' disease on Mar 27, 2018.   -She is currently on levothyroxine replacement.  She is currently on the levothyroxine 125 mcg p.o. daily before breakfast.  She is taking this product with various types of liquids, not necessarily only with water.   She reports better consistency in taking this medication.  She reports progressive weight gain, reportedly weighing 190 pounds now, and that is her major concern today.  -She reports that her previous symptoms of hyperthyroidism have largely subsided.     -She denies dysphagia, shortness of breath, change in her voice.  She denies cold/hot intolerance.  She denies any family history of thyroid cancer.    Review of Systems -Limited as above.    Objective:    There were no vitals taken for this visit.  Wt Readings from Last 3 Encounters:  09/02/19 184 lb (83.5 kg)  08/27/19 191 lb 12.8 oz (87 kg)  12/26/18 184 lb 3.2  oz (83.6 kg)    Physical Exam   CMP     Component Value Date/Time   NA 143 01/16/2017 1302   K 4.3 01/16/2017 1302   CL 105 01/16/2017 1302   CO2 24 01/16/2017 1302   GLUCOSE 72 01/16/2017 1302   BUN 6 (  L) 01/16/2017 1302   CREATININE 0.88 01/16/2017 1302   CALCIUM 9.4 01/16/2017 1302   PROT 7.4 01/16/2017 1302   ALBUMIN 4.0 01/16/2017 1302   AST 17 01/16/2017 1302   ALT 9 01/16/2017 1302   ALKPHOS 82 01/16/2017 1302   BILITOT 0.2 01/16/2017 1302   Recent Results (from the past 2160 hour(s))  TSH     Status: Abnormal   Collection Time: 11/26/19 12:00 AM  Result Value Ref Range   TSH 7.78 (A) 0.41 - 5.90     May 16, 2018 labs showed TSH <0.006, free T4 1.74  Her thyroid ultrasound on August 15, 2017 showed right lobe 4.3 cm and left lobe 4.1 cm.  Scattered bilateral small nodules, largest on the right lobe 0.5 cm.  Repeat thyroid ultrasound from February 28, 2018 is unremarkable, no nodular lesions.   Assessment & Plan:   1.  RAI induced hypothyroidism  2.  Graves' disease-resolved status post RAI treatment  -Her previsit thyroid function tests are consistent with under replacement.  I discussed and increase her levothyroxine to 137 mcg p.o. daily before breakfast.     - We discussed about the correct intake of her thyroid hormone, on empty stomach at fasting, with water, separated by at least 30 minutes from breakfast and other medications,  and separated by more than 4 hours from calcium, iron, multivitamins, acid reflux medications (PPIs). -Patient is made aware of the fact that thyroid hormone replacement is needed for life, dose to be adjusted by periodic monitoring of thyroid function tests.  -The metoprolol which was started for tachyarrhythmia significantly prior to her hyperthyroidism will be continued at low-dose 25 mg p.o. daily.     - I advised patient to maintain close follow up with Sasser, Silvestre Moment, MD for primary care needs.      - Time spent on this  patient care encounter:  20 minutes of which 50% was spent in  counseling and the rest reviewing  her current and  previous labs / studies and medications  doses and developing a plan for long term care. Isabella Scott  participated in the discussions, expressed understanding, and voiced agreement with the above plans.  All questions were answered to her satisfaction. she is encouraged to contact clinic should she have any questions or concerns prior to her return visit.   Follow up plan: Return in about 3 months (around 02/29/2020) for Follow up with Pre-visit Labs.   Glade Lloyd, MD [facility] [address] [place], [place] [address] Phone: [phone]  Fax: [phone]     12/01/2019, 10:22 AM  This note was partially dictated with voice recognition software. Similar sounding words can be transcribed inadequately or may not  be corrected upon review.

## 2019-12-08 DIAGNOSIS — E538 Deficiency of other specified B group vitamins: Secondary | ICD-10-CM | POA: Diagnosis not present

## 2019-12-29 ENCOUNTER — Ambulatory Visit: Payer: Medicare Other | Admitting: Internal Medicine

## 2020-01-08 DIAGNOSIS — Z1231 Encounter for screening mammogram for malignant neoplasm of breast: Secondary | ICD-10-CM | POA: Diagnosis not present

## 2020-01-08 DIAGNOSIS — E538 Deficiency of other specified B group vitamins: Secondary | ICD-10-CM | POA: Diagnosis not present

## 2020-01-21 DIAGNOSIS — Z6830 Body mass index (BMI) 30.0-30.9, adult: Secondary | ICD-10-CM | POA: Diagnosis not present

## 2020-01-21 DIAGNOSIS — M5430 Sciatica, unspecified side: Secondary | ICD-10-CM | POA: Diagnosis not present

## 2020-01-22 DIAGNOSIS — Z6831 Body mass index (BMI) 31.0-31.9, adult: Secondary | ICD-10-CM | POA: Diagnosis not present

## 2020-01-22 DIAGNOSIS — I10 Essential (primary) hypertension: Secondary | ICD-10-CM | POA: Diagnosis not present

## 2020-01-22 DIAGNOSIS — K5090 Crohn's disease, unspecified, without complications: Secondary | ICD-10-CM | POA: Diagnosis not present

## 2020-01-22 DIAGNOSIS — Z885 Allergy status to narcotic agent status: Secondary | ICD-10-CM | POA: Diagnosis not present

## 2020-01-22 DIAGNOSIS — H269 Unspecified cataract: Secondary | ICD-10-CM | POA: Diagnosis not present

## 2020-01-22 DIAGNOSIS — F419 Anxiety disorder, unspecified: Secondary | ICD-10-CM | POA: Diagnosis not present

## 2020-01-22 DIAGNOSIS — Z87891 Personal history of nicotine dependence: Secondary | ICD-10-CM | POA: Diagnosis not present

## 2020-01-22 DIAGNOSIS — K219 Gastro-esophageal reflux disease without esophagitis: Secondary | ICD-10-CM | POA: Diagnosis not present

## 2020-01-22 DIAGNOSIS — E669 Obesity, unspecified: Secondary | ICD-10-CM | POA: Diagnosis not present

## 2020-01-22 DIAGNOSIS — F339 Major depressive disorder, recurrent, unspecified: Secondary | ICD-10-CM | POA: Diagnosis not present

## 2020-01-22 DIAGNOSIS — E039 Hypothyroidism, unspecified: Secondary | ICD-10-CM | POA: Diagnosis not present

## 2020-02-05 DIAGNOSIS — E539 Vitamin B deficiency, unspecified: Secondary | ICD-10-CM | POA: Diagnosis not present

## 2020-02-13 DIAGNOSIS — I2699 Other pulmonary embolism without acute cor pulmonale: Secondary | ICD-10-CM | POA: Diagnosis not present

## 2020-02-13 DIAGNOSIS — R002 Palpitations: Secondary | ICD-10-CM | POA: Diagnosis not present

## 2020-02-13 DIAGNOSIS — R911 Solitary pulmonary nodule: Secondary | ICD-10-CM | POA: Diagnosis not present

## 2020-02-13 DIAGNOSIS — E0590 Thyrotoxicosis, unspecified without thyrotoxic crisis or storm: Secondary | ICD-10-CM | POA: Diagnosis not present

## 2020-02-13 DIAGNOSIS — R918 Other nonspecific abnormal finding of lung field: Secondary | ICD-10-CM | POA: Diagnosis not present

## 2020-02-13 LAB — BASIC METABOLIC PANEL
BUN: 7 (ref 4–21)
Creatinine: 0.9 (ref 0.5–1.1)

## 2020-02-13 LAB — TSH: TSH: 0.72 (ref 0.41–5.90)

## 2020-02-15 ENCOUNTER — Other Ambulatory Visit: Payer: Self-pay | Admitting: "Endocrinology

## 2020-02-15 ENCOUNTER — Telehealth: Payer: Self-pay | Admitting: "Endocrinology

## 2020-02-15 MED ORDER — LEVOTHYROXINE SODIUM 100 MCG PO TABS: 100.0000 ug | ORAL_TABLET | Freq: Every day | ORAL | 1 refills | Status: DC

## 2020-02-15 NOTE — Telephone Encounter (Signed)
Pt left a VM that she has been back and forth to the hospital for palpatations She said the hospital advise her that her thyroid medication is too strong. She has stopped it for now. Please advise

## 2020-02-15 NOTE — Telephone Encounter (Signed)
Stopping the medication is not recommended. Her body needs this hormone on a daily basis. I will lower the dose to 100 mcg.

## 2020-02-15 NOTE — Telephone Encounter (Signed)
She went to [facility]

## 2020-02-15 NOTE — Telephone Encounter (Signed)
Left a message requesting a return call to the office.

## 2020-02-16 NOTE — Telephone Encounter (Signed)
Pt.notified

## 2020-02-17 ENCOUNTER — Encounter: Payer: Self-pay | Admitting: "Endocrinology

## 2020-02-17 ENCOUNTER — Other Ambulatory Visit: Payer: Self-pay

## 2020-02-17 ENCOUNTER — Ambulatory Visit (INDEPENDENT_AMBULATORY_CARE_PROVIDER_SITE_OTHER): Payer: Medicare PPO | Admitting: "Endocrinology

## 2020-02-17 VITALS — BP 145/87 | HR 96 | Ht 66.0 in | Wt 189.0 lb

## 2020-02-17 DIAGNOSIS — F411 Generalized anxiety disorder: Secondary | ICD-10-CM | POA: Diagnosis not present

## 2020-02-17 DIAGNOSIS — E890 Postprocedural hypothyroidism: Secondary | ICD-10-CM

## 2020-02-17 NOTE — Progress Notes (Signed)
02/17/2020, 9:10 PM                        Endocrinology follow-up note   Subjective:    Patient ID: Isabella Scott, female    DOB: Jun 07, 1956, PCP Sasser, Silvestre Moment, MD   Past Medical History:  Diagnosis Date  . Anxiety   . Arthritis   . Crohn disease ([place] [employer] [place])   . Dysphagia   . GERD (gastroesophageal reflux disease)   . 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 BUNIONECTOMY Left 2000  . BUNIONECTOMY Right 2005  . COLONOSCOPY N/A 2010  . COLONOSCOPY N/A 01/28/2017   Procedure: COLONOSCOPY;  Surgeon: Danie Binder, MD;  [facility];  Service: Endoscopy;  Laterality: N/A;  9:30am  . ESOPHAGOGASTRODUODENOSCOPY N/A 2010   with dilation  . PARTIAL COLECTOMY N/A 1981   terminal ileum resection   Social History   Socioeconomic History  . Marital status: Married    Spouse name: Not on file  . Number of children: Not on file  . Years of education: Not on file  . Highest education level: Not on file  Occupational History  . Not on file  Tobacco Use  . Smoking status: Former Smoker    Types: Cigarettes    Quit date: 01/03/1997    Years since quitting: 23.1  . Smokeless tobacco: Never Used  Substance and Sexual Activity  . Alcohol use: No  . Drug use: No  . Sexual activity: Not on file  Other Topics Concern  . Not on file  Social History Narrative  . Not on file   Social Determinants of Health   Financial Resource Strain:   . Difficulty of Paying Living Expenses:   Food Insecurity:   . Worried About Charity fundraiser in the Last Year:   . Arboriculturist in the Last Year:   Transportation Needs:   . Film/video editor (Medical):   Marland Kitchen Lack of Transportation (Non-Medical):   Physical Activity:   . Days of Exercise per Week:   . Minutes of Exercise per Session:   Stress:   . Feeling of Stress :   Social Connections:   . Frequency of Communication with  Friends and Family:   . Frequency of Social Gatherings with Friends and Family:   . Attends Religious Services:   . Active Member of Clubs or Organizations:   . Attends Archivist Meetings:   Marland Kitchen Marital Status:    Outpatient Encounter Medications as of 02/17/2020  Medication Sig  . acetaminophen (TYLENOL) 500 MG tablet Take 500-1,000 mg by mouth daily as needed for moderate pain or headache.  Marland Kitchen aspirin EC 81 MG tablet Take 81 mg by mouth daily.  Marland Kitchen azaTHIOprine (IMURAN) 50 MG tablet Take 50 mg by mouth every evening.   Marland Kitchen buPROPion (WELLBUTRIN SR) 150 MG 12 hr tablet TK 1 T PO QD FOR 2 WEEKS THEN TK 1 T PO BID  . Calcium Carb-Cholecalciferol (CALCIUM 600+D) 600-800 MG-UNIT TABS Take 1 tablet by mouth daily.  . Chlorpheniramine-Phenylephrine (SINUS & ALLERGY PO) Take by mouth.  . citalopram (CELEXA) 20 MG tablet Take 40  mg by mouth daily.  . Cyanocobalamin (VITAMIN B-12 IJ) Inject 1 Dose as directed every 30 (thirty) days.   Marland Kitchen esomeprazole (NEXIUM) 40 MG capsule Take 40 mg by mouth daily at 12 noon.  Marland Kitchen levothyroxine (SYNTHROID) 100 MCG tablet TAKE 1 TABLET(100 MCG) BY MOUTH DAILY BEFORE BREAKFAST  . LORazepam (ATIVAN) 0.5 MG tablet Take 0.5 mg by mouth 2 (two) times daily as needed for anxiety.   . metoprolol tartrate (LOPRESSOR) 25 MG tablet Take 25 mg by mouth daily.   . Multiple Vitamin (MULTIVITAMIN) capsule Take 1 capsule by mouth daily.  . Naphazoline-Pheniramine (OPCON-A OP) Apply 1 drop to eye daily as needed (allergies).  . potassium chloride (K-DUR,KLOR-CON) 10 MEQ tablet Take 10 mEq by mouth 2 (two) times daily.  . predniSONE (DELTASONE) 1 MG tablet Take 4 mg by mouth daily with breakfast.  . Tetrahydrozoline HCl (VISINE OP) Apply 1 drop to eye daily as needed (dry eyes).  . valACYclovir (VALTREX) 500 MG tablet Take 500 mg by mouth 2 (two) times daily.   No facility-administered encounter medications on file as of 02/17/2020.   ALLERGIES: Allergies  Allergen Reactions  .  Meperidine     Funny feeling  . Morphine And Related     Funny feeling  . Sulfamethoxazole Itching and Rash    VACCINATION STATUS: Immunization History  Administered Date(s) Administered  . Influenza-Unspecified 08/12/2018    HPI Isabella Scott is 64 y.o. female who is s/p RAI thyroid ablation for Graves' disease in May 2019.  She was put on levothyroxine.  During her last visit her levothyroxine was increased to 137 mcg p.o. daily before breakfast.  She continued to do well anterior April 3 when she developed anxiety for which she visited ER in [place].  She was found to have slightly above target free T4 of 1.92.  Patient called clinic for this concern and her Synthroid was lowered to 100 mcg p.o. daily before breakfast.  Since then she took 1 dose on Saturday.  She explains that her anxiety returned after that dose and did not take any thyroid hormone for the last 3 days.  She has always been skeptical of this hormone and actually wants to come off of all types of thyroid hormone supplements.    She continues to have anxiety associated with vaguely described tingling sensation on her upper extremities, back, neck.   -She called in and requested to be seen today for the symptoms.   She denies chest pain, her troponin was not elevated when she visited ER.  -She reports that lorazepam helps her with pain and requests a prescription for this.  She reports fluctuating body weight, currently at 189 pounds.  -She denies dysphagia, shortness of breath, change in her voice.  She denies cold/hot intolerance.  She denies any family history of thyroid cancer.    Review of Systems -Limited as above.    Objective:    BP (!) 145/87   Pulse 96   Ht 5' 6"  (1.676 m)   Wt 189 lb (85.7 kg)   BMI 30.51 kg/m   Wt Readings from Last 3 Encounters:  02/17/20 189 lb (85.7 kg)  09/02/19 184 lb (83.5 kg)  08/27/19 191 lb 12.8 oz (87 kg)    Physical Exam  Patient is anxious, tearful. CMP      Component Value Date/Time   NA 143 01/16/2017 1302   K 4.3 01/16/2017 1302   CL 105 01/16/2017 1302   CO2 24 01/16/2017 1302  GLUCOSE 72 01/16/2017 1302   BUN 7 02/13/2020 0000   CREATININE 0.9 02/13/2020 0000   CREATININE 0.88 01/16/2017 1302   CALCIUM 9.4 01/16/2017 1302   PROT 7.4 01/16/2017 1302   ALBUMIN 4.0 01/16/2017 1302   AST 17 01/16/2017 1302   ALT 9 01/16/2017 1302   ALKPHOS 82 01/16/2017 1302   BILITOT 0.2 01/16/2017 1302   Recent Results (from the past 2160 hour(s))  TSH     Status: Abnormal   Collection Time: 11/26/19 12:00 AM  Result Value Ref Range   TSH 7.78 (A) 0.41 - 0.96  Basic metabolic panel     Status: None   Collection Time: 02/13/20 12:00 AM  Result Value Ref Range   BUN 7 4 - 21   Creatinine 0.9 0.5 - 1.1  TSH     Status: None   Collection Time: 02/13/20 12:00 AM  Result Value Ref Range   TSH 0.72 0.41 - 5.90    Comment: free t4 1.92     May 16, 2018 labs showed TSH <0.006, free T4 1.74  Her thyroid ultrasound on August 15, 2017 showed right lobe 4.3 cm and left lobe 4.1 cm.  Scattered bilateral small nodules, largest on the right lobe 0.5 cm.  Repeat thyroid ultrasound from February 28, 2018 is unremarkable, no nodular lesions.  Recent Results (from the past 2160 hour(s))  TSH     Status: Abnormal   Collection Time: 11/26/19 12:00 AM  Result Value Ref Range   TSH 7.78 (A) 0.41 - 0.45  Basic metabolic panel     Status: None   Collection Time: 02/13/20 12:00 AM  Result Value Ref Range   BUN 7 4 - 21   Creatinine 0.9 0.5 - 1.1  TSH     Status: None   Collection Time: 02/13/20 12:00 AM  Result Value Ref Range   TSH 0.72 0.41 - 5.90    Comment: free t4 1.92     Assessment & Plan:   1.  RAI induced hypothyroidism  2.  Graves' disease-resolved status post RAI treatment 3.  Anxiety state -After her thyroid ablation with I-131, she has never been consistent taking her thyroid hormone supplements.  At this time, patient's main problem  is anxiety state out of proportion for her degree of thyroid hormone burden.  She wants to come off of thyroid hormone which is impossible.  I had a long discussion with her that she will need thyroid hormone supplement for the rest of her life.  Dose adjustment will depend on her thyroid function tests.    I recommended for her to stay off of thyroid hormone for only a week (and not more  than a week) and repeat thyroid function tests.  After that, she will be continued on a lower dose of Synthroid 100 mcg p.o. daily before breakfast.  She is suspicious of thyroid hormone in general.  She would benefit from evaluation for general anxiety disorder.  She is advised to go to ER if she develops chest pain or shortness of breath. She is on Lopressor started for tachyarrhythmia even prior to her diagnosis of hyperthyroidism.  She is advised to continue 25 mg p.o. twice daily. - I advised patient to maintain close follow up with Sasser, Silvestre Moment, MD for primary care needs.     - Time spent on this patient care encounter:  25 minutes of which 50% was spent in  counseling and the rest reviewing  her current and  previous labs / studies and medications  doses and developing a plan for long term care. Isabella Scott  participated in the discussions, expressed understanding, and voiced agreement with the above plans.  All questions were answered to her satisfaction. she is encouraged to contact clinic should she have any questions or concerns prior to her return visit.    Follow up plan: Return in about 12 days (around 02/29/2020), or phone visit, for Follow up with Pre-visit Labs.   Glade Lloyd, MD [facility] [address] [place], [place] [address] Phone: [phone]  Fax: [phone]     02/17/2020, 9:10 PM  This note was partially dictated with voice recognition software. Similar sounding words can be transcribed inadequately or may not  be  corrected upon review.

## 2020-02-17 NOTE — Progress Notes (Signed)
Pt wants to discuss medication. States she was seen in the ER Saturday for rapid heart rate, tingling in her L and R arm radiates across back. States she was told she was taking too much thyroid medication. Pt's levothyroxine was reduced to 167mg. She stated she took the 1052m yesterday and felt good but this morning took her levothyroxine and started experiencing the same symptoms as Saturday.

## 2020-02-19 DIAGNOSIS — F411 Generalized anxiety disorder: Secondary | ICD-10-CM | POA: Diagnosis not present

## 2020-02-19 DIAGNOSIS — Z1331 Encounter for screening for depression: Secondary | ICD-10-CM | POA: Diagnosis not present

## 2020-02-19 DIAGNOSIS — Z6830 Body mass index (BMI) 30.0-30.9, adult: Secondary | ICD-10-CM | POA: Diagnosis not present

## 2020-02-19 DIAGNOSIS — F410 Panic disorder [episodic paroxysmal anxiety] without agoraphobia: Secondary | ICD-10-CM | POA: Diagnosis not present

## 2020-02-19 DIAGNOSIS — Z1389 Encounter for screening for other disorder: Secondary | ICD-10-CM | POA: Diagnosis not present

## 2020-02-26 DIAGNOSIS — E890 Postprocedural hypothyroidism: Secondary | ICD-10-CM | POA: Diagnosis not present

## 2020-02-27 LAB — TSH: TSH: 8.57 mIU/L — ABNORMAL HIGH (ref 0.40–4.50)

## 2020-02-27 LAB — COMPLETE METABOLIC PANEL WITH GFR
AG Ratio: 1.3 (calc) (ref 1.0–2.5)
ALT: 7 U/L (ref 6–29)
AST: 13 U/L (ref 10–35)
Albumin: 3.7 g/dL (ref 3.6–5.1)
Alkaline phosphatase (APISO): 73 U/L (ref 37–153)
BUN: 13 mg/dL (ref 7–25)
CO2: 25 mmol/L (ref 20–32)
Calcium: 8.9 mg/dL (ref 8.6–10.4)
Chloride: 107 mmol/L (ref 98–110)
Creat: 0.87 mg/dL (ref 0.50–0.99)
GFR, Est African American: 83 mL/min/{1.73_m2} (ref >=60)
GFR, Est Non African American: 71 mL/min/{1.73_m2} (ref >=60)
Globulin: 2.9 g/dL (calc) (ref 1.9–3.7)
Glucose, Bld: 80 mg/dL (ref 65–99)
Potassium: 4.1 mmol/L (ref 3.5–5.3)
Sodium: 141 mmol/L (ref 135–146)
Total Bilirubin: 0.2 mg/dL (ref 0.2–1.2)
Total Protein: 6.6 g/dL (ref 6.1–8.1)

## 2020-02-27 LAB — T4, FREE: Free T4: 0.8 ng/dL (ref 0.8–1.8)

## 2020-02-29 ENCOUNTER — Other Ambulatory Visit: Payer: Self-pay

## 2020-02-29 ENCOUNTER — Encounter: Payer: Self-pay | Admitting: "Endocrinology

## 2020-02-29 ENCOUNTER — Ambulatory Visit: Payer: Medicare PPO | Admitting: "Endocrinology

## 2020-02-29 ENCOUNTER — Ambulatory Visit (INDEPENDENT_AMBULATORY_CARE_PROVIDER_SITE_OTHER): Payer: Medicare PPO | Admitting: "Endocrinology

## 2020-02-29 VITALS — BP 127/83 | HR 86 | Ht 66.0 in | Wt 187.6 lb

## 2020-02-29 DIAGNOSIS — E0500 Thyrotoxicosis with diffuse goiter without thyrotoxic crisis or storm: Secondary | ICD-10-CM

## 2020-02-29 DIAGNOSIS — E890 Postprocedural hypothyroidism: Secondary | ICD-10-CM | POA: Diagnosis not present

## 2020-02-29 MED ORDER — SYNTHROID 75 MCG PO TABS: 75.0000 ug | ORAL_TABLET | Freq: Every day | ORAL | 2 refills | Status: DC

## 2020-02-29 NOTE — Progress Notes (Signed)
02/29/2020, 12:28 PM                        Endocrinology follow-up note   Subjective:    Patient ID: Isabella Scott, female    DOB: May 29, 1956, PCP Isabella Scott, Isabella Moment, MD   Past Medical History:  Diagnosis Date  . Anxiety   . Arthritis   . Crohn disease ([place])   . Dysphagia   . GERD (gastroesophageal reflux disease)   . 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 BUNIONECTOMY Left 2000  . BUNIONECTOMY Right 2005  . COLONOSCOPY N/A 2010  . COLONOSCOPY N/A 01/28/2017   Procedure: COLONOSCOPY;  Surgeon: Isabella Binder, MD;  [facility];  Service: Endoscopy;  Laterality: N/A;  9:30am  . ESOPHAGOGASTRODUODENOSCOPY N/A 2010   with dilation  . PARTIAL COLECTOMY N/A 1981   terminal ileum resection   Social History   Socioeconomic History  . Marital status: Married    Spouse name: Not on file  . Number of children: Not on file  . Years of education: Not on file  . Highest education level: Not on file  Occupational History  . Not on file  Tobacco Use  . Smoking status: Former Smoker    Types: Cigarettes    Quit date: 01/03/1997    Years since quitting: 23.1  . Smokeless tobacco: Never Used  Substance and Sexual Activity  . Alcohol use: No  . Drug use: No  . Sexual activity: Not on file  Other Topics Concern  . Not on file  Social History Narrative  . Not on file   Social Determinants of Health   Financial Resource Strain:   . Difficulty of Paying Living Expenses:   Food Insecurity:   . Worried About Charity fundraiser in the Last Year:   . Arboriculturist in the Last Year:   Transportation Needs:   . Film/video editor (Medical):   Marland Kitchen Lack of Transportation (Non-Medical):   Physical Activity:   . Days of Exercise per Week:   . Minutes of Exercise per Session:   Stress:   . Feeling of Stress :   Social Connections:   . Frequency of Communication with  Friends and Family:   . Frequency of Social Gatherings with Friends and Family:   . Attends Religious Services:   . Active Member of Clubs or Organizations:   . Attends Archivist Meetings:   Marland Kitchen Marital Status:    Outpatient Encounter Medications as of 02/29/2020  Medication Sig  . acetaminophen (TYLENOL) 500 MG tablet Take 500-1,000 mg by mouth daily as needed for moderate pain or headache.  Marland Kitchen aspirin EC 81 MG tablet Take 81 mg by mouth daily.  Marland Kitchen azaTHIOprine (IMURAN) 50 MG tablet Take 50 mg by mouth every evening.   Marland Kitchen buPROPion (WELLBUTRIN SR) 150 MG 12 hr tablet TK 1 T PO QD FOR 2 WEEKS THEN TK 1 T PO BID  . Calcium Carb-Cholecalciferol (CALCIUM 600+D) 600-800 MG-UNIT TABS Take 1 tablet by mouth daily.  . Chlorpheniramine-Phenylephrine (SINUS & ALLERGY PO) Take by mouth.  . citalopram (CELEXA) 20 MG tablet Take 40  mg by mouth daily.  . Cyanocobalamin (VITAMIN B-12 IJ) Inject 1 Dose as directed every 30 (thirty) days.   Marland Kitchen esomeprazole (NEXIUM) 40 MG capsule Take 40 mg by mouth daily at 12 noon.  Marland Kitchen LORazepam (ATIVAN) 0.5 MG tablet Take 0.5 mg by mouth 2 (two) times daily as needed for anxiety.   . metoprolol tartrate (LOPRESSOR) 25 MG tablet Take 25 mg by mouth daily.   . Multiple Vitamin (MULTIVITAMIN) capsule Take 1 capsule by mouth daily.  . Naphazoline-Pheniramine (OPCON-A OP) Apply 1 drop to eye daily as needed (allergies).  . potassium chloride (K-DUR,KLOR-CON) 10 MEQ tablet Take 10 mEq by mouth 2 (two) times daily.  Marland Kitchen SYNTHROID 75 MCG tablet Take 1 tablet (75 mcg total) by mouth daily before breakfast.  . Tetrahydrozoline HCl (VISINE OP) Apply 1 drop to eye daily as needed (dry eyes).  . valACYclovir (VALTREX) 500 MG tablet Take 500 mg by mouth 2 (two) times daily.  . [DISCONTINUED] levothyroxine (SYNTHROID) 100 MCG tablet TAKE 1 TABLET(100 MCG) BY MOUTH DAILY BEFORE BREAKFAST  . [DISCONTINUED] predniSONE (DELTASONE) 1 MG tablet Take 4 mg by mouth daily with breakfast.    No facility-administered encounter medications on file as of 02/29/2020.   ALLERGIES: Allergies  Allergen Reactions  . Meperidine     Funny feeling  . Morphine And Related     Funny feeling  . Sulfamethoxazole Itching and Rash    VACCINATION STATUS: Immunization History  Administered Date(s) Administered  . Influenza-Unspecified 08/12/2018    HPI Isabella Scott is 64 y.o. female who is s/p RAI thyroid ablation for Graves' disease in May 2019.  -She was subsequently initiated on thyroid hormone replacement, dose was progressively adjusted to 137 mcg p.o. every morning.  She developed unexplained anxiety for which she was seen during her last visit here. -She could not be convinced with the fact that her thyroid hormone replacement is not the major cause of her anxiety.  She was taken off of thyroid hormone for 7 days with plan to repeat thyroid function test. -Her previsit labs show higher TSH of 8.57, and free T4 lowered to 0.8.  She has always been skeptical of this hormone and actually wants to come off of all types of thyroid hormone supplements.    She continues to have anxiety associated with vaguely described tingling sensation on her upper extremities, back, neck.    She denies chest pain.   She reports fluctuating body weight, currently at 187 pounds.  -She denies dysphagia, shortness of breath, change in her voice.  She denies cold/hot intolerance.  She denies any family history of thyroid cancer.    Review of Systems -Limited as above.    Objective:    BP 127/83   Pulse 86   Ht 5' 6"  (1.676 m)   Wt 187 lb 9.6 oz (85.1 kg)   BMI 30.28 kg/m   Wt Readings from Last 3 Encounters:  02/29/20 187 lb 9.6 oz (85.1 kg)  02/17/20 189 lb (85.7 kg)  09/02/19 184 lb (83.5 kg)    Physical Exam  Patient is anxious, tearful. CMP     Component Value Date/Time   NA 141 02/26/2020 0805   K 4.1 02/26/2020 0805   CL 107 02/26/2020 0805   CO2 25 02/26/2020 0805    GLUCOSE 80 02/26/2020 0805   BUN 13 02/26/2020 0805   BUN 7 02/13/2020 0000   CREATININE 0.87 02/26/2020 0805   CALCIUM 8.9 02/26/2020 0805   PROT 6.6  02/26/2020 0805   ALBUMIN 4.0 01/16/2017 1302   AST 13 02/26/2020 0805   ALT 7 02/26/2020 0805   ALKPHOS 82 01/16/2017 1302   BILITOT 0.2 02/26/2020 0805   GFRNONAA 71 02/26/2020 0805   GFRAA 83 02/26/2020 0805   Recent Results (from the past 2160 hour(s))  Basic metabolic panel     Status: None   Collection Time: 02/13/20 12:00 AM  Result Value Ref Range   BUN 7 4 - 21   Creatinine 0.9 0.5 - 1.1  TSH     Status: None   Collection Time: 02/13/20 12:00 AM  Result Value Ref Range   TSH 0.72 0.41 - 5.90    Comment: free t4 1.92  TSH     Status: Abnormal   Collection Time: 02/26/20  8:05 AM  Result Value Ref Range   TSH 8.57 (H) 0.40 - 4.50 mIU/L  T4, free     Status: None   Collection Time: 02/26/20  8:05 AM  Result Value Ref Range   Free T4 0.8 0.8 - 1.8 ng/dL  COMPLETE METABOLIC PANEL WITH GFR     Status: None   Collection Time: 02/26/20  8:05 AM  Result Value Ref Range   Glucose, Bld 80 65 - 99 mg/dL    Comment: .            Fasting reference interval .    BUN 13 7 - 25 mg/dL   Creat 0.87 0.50 - 0.99 mg/dL    Comment: For patients >54 years of age, the reference limit for Creatinine is approximately 13% higher for people identified as African-American. .    GFR, Est Non African American 71 > OR = 60 mL/min/1.71m   GFR, Est African American 83 > OR = 60 mL/min/1.755m  BUN/Creatinine Ratio NOT APPLICABLE 6 - 22 (calc)   Sodium 141 135 - 146 mmol/L   Potassium 4.1 3.5 - 5.3 mmol/L   Chloride 107 98 - 110 mmol/L   CO2 25 20 - 32 mmol/L   Calcium 8.9 8.6 - 10.4 mg/dL   Total Protein 6.6 6.1 - 8.1 g/dL   Albumin 3.7 3.6 - 5.1 g/dL   Globulin 2.9 1.9 - 3.7 g/dL (calc)   AG Ratio 1.3 1.0 - 2.5 (calc)   Total Bilirubin 0.2 0.2 - 1.2 mg/dL   Alkaline phosphatase (APISO) 73 37 - 153 U/L   AST 13 10 - 35 U/L   ALT 7  6 - 29 U/L     May 16, 2018 labs showed TSH <0.006, free T4 1.74  Her thyroid ultrasound on August 15, 2017 showed right lobe 4.3 cm and left lobe 4.1 cm.  Scattered bilateral small nodules, largest on the right lobe 0.5 cm.  Repeat thyroid ultrasound from February 28, 2018 is unremarkable, no nodular lesions.  Recent Results (from the past 2160 hour(s))  Basic metabolic panel     Status: None   Collection Time: 02/13/20 12:00 AM  Result Value Ref Range   BUN 7 4 - 21   Creatinine 0.9 0.5 - 1.1  TSH     Status: None   Collection Time: 02/13/20 12:00 AM  Result Value Ref Range   TSH 0.72 0.41 - 5.90    Comment: free t4 1.92  TSH     Status: Abnormal   Collection Time: 02/26/20  8:05 AM  Result Value Ref Range   TSH 8.57 (H) 0.40 - 4.50 mIU/L  T4, free     Status:  None   Collection Time: 02/26/20  8:05 AM  Result Value Ref Range   Free T4 0.8 0.8 - 1.8 ng/dL  COMPLETE METABOLIC PANEL WITH GFR     Status: None   Collection Time: 02/26/20  8:05 AM  Result Value Ref Range   Glucose, Bld 80 65 - 99 mg/dL    Comment: .            Fasting reference interval .    BUN 13 7 - 25 mg/dL   Creat 0.87 0.50 - 0.99 mg/dL    Comment: For patients >80 years of age, the reference limit for Creatinine is approximately 13% higher for people identified as African-American. .    GFR, Est Non African American 71 > OR = 60 mL/min/1.8m   GFR, Est African American 83 > OR = 60 mL/min/1.732m  BUN/Creatinine Ratio NOT APPLICABLE 6 - 22 (calc)   Sodium 141 135 - 146 mmol/L   Potassium 4.1 3.5 - 5.3 mmol/L   Chloride 107 98 - 110 mmol/L   CO2 25 20 - 32 mmol/L   Calcium 8.9 8.6 - 10.4 mg/dL   Total Protein 6.6 6.1 - 8.1 g/dL   Albumin 3.7 3.6 - 5.1 g/dL   Globulin 2.9 1.9 - 3.7 g/dL (calc)   AG Ratio 1.3 1.0 - 2.5 (calc)   Total Bilirubin 0.2 0.2 - 1.2 mg/dL   Alkaline phosphatase (APISO) 73 37 - 153 U/L   AST 13 10 - 35 U/L   ALT 7 6 - 29 U/L     Assessment & Plan:   1.  RAI induced  hypothyroidism  2.  Graves' disease-resolved status post RAI treatment 3.  Anxiety state -Her previsit labs are consistent with inadequate replacement, and confirms the fact that she cannot come off of her thyroid hormone replacement.   I had a long discussion with her regarding this outcome and she is willing to resume her thyroid hormone at significantly lower dose.  She will also benefit from Synthroid instead of levothyroxine.  I discussed and initiated Synthroid 75 mcg p.o. daily before breakfast.     - We discussed about the correct intake of her thyroid hormone, on empty stomach at fasting, with water, separated by at least 30 minutes from breakfast and other medications,  and separated by more than 4 hours from calcium, iron, multivitamins, acid reflux medications (PPIs). -Patient is made aware of the fact that thyroid hormone replacement is needed for life, dose to be adjusted by periodic monitoring of thyroid function tests.    Regarding her anxiety state, she will benefit from evaluation by psych for better medication.   - I advised patient to maintain close follow up with Isabella Scott, PaSilvestre MomentMD for primary care needs.      - Time spent on this patient care encounter:  25 minutes of which 50% was spent in  counseling and the rest reviewing  her current and  previous labs / studies and medications  doses and developing a plan for long term care. AnOrlinda Blalockparticipated in the discussions, expressed understanding, and voiced agreement with the above plans.  All questions were answered to her satisfaction. she is encouraged to contact clinic should she have any questions or concerns prior to her return visit.    Follow up plan: Return in about 9 weeks (around 05/02/2020) for Follow up with Pre-visit Labs.   GeGlade LloydMD [facility] [address]  [place], [place] [address] Phone: [phone]  Fax: [phone]      02/29/2020, 12:28 PM  This note was partially dictated with voice recognition software. Similar sounding words can be transcribed inadequately or may not  be corrected upon review.

## 2020-03-02 ENCOUNTER — Ambulatory Visit: Payer: Medicare PPO | Admitting: "Endocrinology

## 2020-03-07 DIAGNOSIS — S96912D Strain of unspecified muscle and tendon at ankle and foot level, left foot, subsequent encounter: Secondary | ICD-10-CM | POA: Diagnosis not present

## 2020-03-07 DIAGNOSIS — E538 Deficiency of other specified B group vitamins: Secondary | ICD-10-CM | POA: Diagnosis not present

## 2020-03-07 DIAGNOSIS — M25572 Pain in left ankle and joints of left foot: Secondary | ICD-10-CM | POA: Diagnosis not present

## 2020-03-27 DIAGNOSIS — Z7982 Long term (current) use of aspirin: Secondary | ICD-10-CM | POA: Diagnosis not present

## 2020-03-27 DIAGNOSIS — I499 Cardiac arrhythmia, unspecified: Secondary | ICD-10-CM | POA: Diagnosis not present

## 2020-03-27 DIAGNOSIS — Z79899 Other long term (current) drug therapy: Secondary | ICD-10-CM | POA: Diagnosis not present

## 2020-03-27 DIAGNOSIS — Z885 Allergy status to narcotic agent status: Secondary | ICD-10-CM | POA: Diagnosis not present

## 2020-03-27 DIAGNOSIS — T7840XA Allergy, unspecified, initial encounter: Secondary | ICD-10-CM | POA: Diagnosis not present

## 2020-03-27 DIAGNOSIS — R911 Solitary pulmonary nodule: Secondary | ICD-10-CM | POA: Diagnosis not present

## 2020-03-27 DIAGNOSIS — R002 Palpitations: Secondary | ICD-10-CM | POA: Diagnosis not present

## 2020-03-27 DIAGNOSIS — R0602 Shortness of breath: Secondary | ICD-10-CM | POA: Diagnosis not present

## 2020-03-27 DIAGNOSIS — R05 Cough: Secondary | ICD-10-CM | POA: Diagnosis not present

## 2020-03-27 DIAGNOSIS — K5090 Crohn's disease, unspecified, without complications: Secondary | ICD-10-CM | POA: Diagnosis not present

## 2020-03-27 DIAGNOSIS — F419 Anxiety disorder, unspecified: Secondary | ICD-10-CM | POA: Diagnosis not present

## 2020-03-27 DIAGNOSIS — R000 Tachycardia, unspecified: Secondary | ICD-10-CM | POA: Diagnosis not present

## 2020-03-27 DIAGNOSIS — Z882 Allergy status to sulfonamides status: Secondary | ICD-10-CM | POA: Diagnosis not present

## 2020-04-04 DIAGNOSIS — E538 Deficiency of other specified B group vitamins: Secondary | ICD-10-CM | POA: Diagnosis not present

## 2020-04-27 DIAGNOSIS — E039 Hypothyroidism, unspecified: Secondary | ICD-10-CM | POA: Diagnosis not present

## 2020-04-28 LAB — TSH: TSH: 20.4 — AB (ref 0.41–5.90)

## 2020-05-02 ENCOUNTER — Telehealth (HOSPITAL_COMMUNITY): Payer: Self-pay | Admitting: *Deleted

## 2020-05-02 NOTE — Telephone Encounter (Signed)
Received referral from [place] and staff called patient to schedule new patient appt and was not able to reach patient and [facility] to call office back. Referring office was notified as well.

## 2020-05-03 ENCOUNTER — Encounter: Payer: Self-pay | Admitting: "Endocrinology

## 2020-05-03 ENCOUNTER — Ambulatory Visit (INDEPENDENT_AMBULATORY_CARE_PROVIDER_SITE_OTHER): Payer: Medicare PPO | Admitting: "Endocrinology

## 2020-05-03 ENCOUNTER — Other Ambulatory Visit: Payer: Self-pay

## 2020-05-03 VITALS — BP 151/82 | HR 88 | Ht 66.0 in | Wt 184.2 lb

## 2020-05-03 DIAGNOSIS — E890 Postprocedural hypothyroidism: Secondary | ICD-10-CM

## 2020-05-03 MED ORDER — SYNTHROID 88 MCG PO TABS: 88.0000 ug | ORAL_TABLET | Freq: Every day | ORAL | 1 refills | Status: DC

## 2020-05-03 NOTE — Progress Notes (Signed)
05/03/2020, 9:26 AM                        Endocrinology follow-up note   Subjective:    Patient ID: Isabella Scott, female    DOB: 23-Sep-1956, PCP Sasser, Silvestre Moment, MD   Past Medical History:  Diagnosis Date  . Anxiety   . Arthritis   . Crohn disease ([place])   . Dysphagia   . GERD (gastroesophageal reflux disease)   . 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 BUNIONECTOMY Left 2000  . BUNIONECTOMY Right 2005  . COLONOSCOPY N/A 2010  . COLONOSCOPY N/A 01/28/2017   Procedure: COLONOSCOPY;  Surgeon: Danie Binder, MD;  [facility];  Service: Endoscopy;  Laterality: N/A;  9:30am  . ESOPHAGOGASTRODUODENOSCOPY N/A 2010   with dilation  . PARTIAL COLECTOMY N/A 1981   terminal ileum resection   Social History   Socioeconomic History  . Marital status: Married    Spouse name: Not on file  . Number of children: Not on file  . Years of education: Not on file  . Highest education level: Not on file  Occupational History  . Not on file  Tobacco Use  . Smoking status: Former Smoker    Types: Cigarettes    Quit date: 01/03/1997    Years since quitting: 23.3  . Smokeless tobacco: Never Used  Vaping Use  . Vaping Use: Never used  Substance and Sexual Activity  . Alcohol use: No  . Drug use: No  . Sexual activity: Not on file  Other Topics Concern  . Not on file  Social History Narrative  . Not on file   Social Determinants of Health   Financial Resource Strain:   . Difficulty of Paying Living Expenses:   Food Insecurity:   . Worried About Charity fundraiser in the Last Year:   . Arboriculturist in the Last Year:   Transportation Needs:   . Film/video editor (Medical):   Marland Kitchen Lack of Transportation (Non-Medical):   Physical Activity:   . Days of Exercise per Week:   . Minutes of Exercise per Session:   Stress:   . Feeling of Stress :   Social Connections:    . Frequency of Communication with Friends and Family:   . Frequency of Social Gatherings with Friends and Family:   . Attends Religious Services:   . Active Member of Clubs or Organizations:   . Attends Archivist Meetings:   Marland Kitchen Marital Status:    Outpatient Encounter Medications as of 05/03/2020  Medication Sig  . acetaminophen (TYLENOL) 500 MG tablet Take 500-1,000 mg by mouth daily as needed for moderate pain or headache.  Marland Kitchen aspirin EC 81 MG tablet Take 81 mg by mouth daily.  Marland Kitchen azaTHIOprine (IMURAN) 50 MG tablet Take 50 mg by mouth every evening.   Marland Kitchen buPROPion (WELLBUTRIN SR) 150 MG 12 hr tablet TK 1 T PO QD FOR 2 WEEKS THEN TK 1 T PO BID  . Calcium Carb-Cholecalciferol (CALCIUM 600+D) 600-800 MG-UNIT TABS Take 1 tablet by mouth daily.  . Chlorpheniramine-Phenylephrine (SINUS & ALLERGY PO) Take by mouth.  Marland Kitchen  citalopram (CELEXA) 20 MG tablet Take 40 mg by mouth daily.  . Cyanocobalamin (VITAMIN B-12 IJ) Inject 1 Dose as directed every 30 (thirty) days.   Marland Kitchen esomeprazole (NEXIUM) 40 MG capsule Take 40 mg by mouth daily at 12 noon.  Marland Kitchen LORazepam (ATIVAN) 0.5 MG tablet Take 0.5 mg by mouth 2 (two) times daily as needed for anxiety.   . metoprolol tartrate (LOPRESSOR) 25 MG tablet Take 25 mg by mouth daily.   . Multiple Vitamin (MULTIVITAMIN) capsule Take 1 capsule by mouth daily.  . Naphazoline-Pheniramine (OPCON-A OP) Apply 1 drop to eye daily as needed (allergies).  . potassium chloride (K-DUR,KLOR-CON) 10 MEQ tablet Take 10 mEq by mouth 2 (two) times daily.  Marland Kitchen SYNTHROID 88 MCG tablet Take 1 tablet (88 mcg total) by mouth daily before breakfast.  . Tetrahydrozoline HCl (VISINE OP) Apply 1 drop to eye daily as needed (dry eyes).  . valACYclovir (VALTREX) 500 MG tablet Take 500 mg by mouth 2 (two) times daily.  . [DISCONTINUED] SYNTHROID 75 MCG tablet Take 1 tablet (75 mcg total) by mouth daily before breakfast.   No facility-administered encounter medications on file as of  05/03/2020.   ALLERGIES: Allergies  Allergen Reactions  . Meperidine     Funny feeling  . Morphine And Related     Funny feeling  . Sulfamethoxazole Itching and Rash    VACCINATION STATUS: Immunization History  Administered Date(s) Administered  . Influenza-Unspecified 08/12/2018    HPI ROSENA Scott is 64 y.o. female who is s/p RAI thyroid ablation for Graves' disease in May 2019.  -She was subsequently initiated on thyroid hormone replacement, dose was progressively adjusted to 137 mcg p.o. every morning.  She developed unexplained anxiety for which she was seen during her last  2 visits here. -She could not be convinced with the fact that her thyroid hormone replacement is not the major cause of her anxiety.  Her thyroid hormone dose was lowered significantly to 75 mcg p.o. daily with plan to adjust.  She returns with new set of thyroid hormone levels consistent with under replacement.    She still has symptoms including generalized anxiety, intermittent palpitations, fluctuating weight.   She continues to have anxiety associated with vaguely described tingling sensation on her upper extremities, back, neck.    She denies chest pain.   She reports weight gain, however her recent weight ranges between 184-187 pounds.  -She denies dysphagia, shortness of breath, change in her voice.  She denies cold/hot intolerance.  She denies any family history of thyroid cancer.    Review of Systems -Limited as above.    Objective:    BP (!) 151/82   Pulse 88   Ht 5' 6"  (1.676 m)   Wt 184 lb 3.2 oz (83.6 kg)   BMI 29.73 kg/m   Wt Readings from Last 3 Encounters:  05/03/20 184 lb 3.2 oz (83.6 kg)  02/29/20 187 lb 9.6 oz (85.1 kg)  02/17/20 189 lb (85.7 kg)    Physical Exam  Patient is anxious, tearful. CMP     Component Value Date/Time   NA 141 02/26/2020 0805   K 4.1 02/26/2020 0805   CL 107 02/26/2020 0805   CO2 25 02/26/2020 0805   GLUCOSE 80 02/26/2020 0805   BUN  13 02/26/2020 0805   BUN 7 02/13/2020 0000   CREATININE 0.87 02/26/2020 0805   CALCIUM 8.9 02/26/2020 0805   PROT 6.6 02/26/2020 0805   ALBUMIN 4.0 01/16/2017 1302  AST 13 02/26/2020 0805   ALT 7 02/26/2020 0805   ALKPHOS 82 01/16/2017 1302   BILITOT 0.2 02/26/2020 0805   GFRNONAA 71 02/26/2020 0805   GFRAA 83 02/26/2020 0805   Recent Results (from the past 2160 hour(s))  Basic metabolic panel     Status: None   Collection Time: 02/13/20 12:00 AM  Result Value Ref Range   BUN 7 4 - 21   Creatinine 0.9 0.5 - 1.1  TSH     Status: None   Collection Time: 02/13/20 12:00 AM  Result Value Ref Range   TSH 0.72 0.41 - 5.90    Comment: free t4 1.92  TSH     Status: Abnormal   Collection Time: 02/26/20  8:05 AM  Result Value Ref Range   TSH 8.57 (H) 0.40 - 4.50 mIU/L  T4, free     Status: None   Collection Time: 02/26/20  8:05 AM  Result Value Ref Range   Free T4 0.8 0.8 - 1.8 ng/dL  COMPLETE METABOLIC PANEL WITH GFR     Status: None   Collection Time: 02/26/20  8:05 AM  Result Value Ref Range   Glucose, Bld 80 65 - 99 mg/dL    Comment: .            Fasting reference interval .    BUN 13 7 - 25 mg/dL   Creat 0.87 0.50 - 0.99 mg/dL    Comment: For patients >91 years of age, the reference limit for Creatinine is approximately 13% higher for people identified as African-American. .    GFR, Est Non African American 71 > OR = 60 mL/min/1.61m   GFR, Est African American 83 > OR = 60 mL/min/1.736m  BUN/Creatinine Ratio NOT APPLICABLE 6 - 22 (calc)   Sodium 141 135 - 146 mmol/L   Potassium 4.1 3.5 - 5.3 mmol/L   Chloride 107 98 - 110 mmol/L   CO2 25 20 - 32 mmol/L   Calcium 8.9 8.6 - 10.4 mg/dL   Total Protein 6.6 6.1 - 8.1 g/dL   Albumin 3.7 3.6 - 5.1 g/dL   Globulin 2.9 1.9 - 3.7 g/dL (calc)   AG Ratio 1.3 1.0 - 2.5 (calc)   Total Bilirubin 0.2 0.2 - 1.2 mg/dL   Alkaline phosphatase (APISO) 73 37 - 153 U/L   AST 13 10 - 35 U/L   ALT 7 6 - 29 U/L  TSH     Status:  Abnormal   Collection Time: 04/27/20 12:00 AM  Result Value Ref Range   TSH 20.40 (A) 0.41 - 5.90    Comment: free t4 1.02 ( 0.82-1.77)     May 16, 2018 labs showed TSH <0.006, free T4 1.74  Her thyroid ultrasound on August 15, 2017 showed right lobe 4.3 cm and left lobe 4.1 cm.  Scattered bilateral small nodules, largest on the right lobe 0.5 cm.  Repeat thyroid ultrasound from February 28, 2018 is unremarkable, no nodular lesions.  Recent Results (from the past 2160 hour(s))  Basic metabolic panel     Status: None   Collection Time: 02/13/20 12:00 AM  Result Value Ref Range   BUN 7 4 - 21   Creatinine 0.9 0.5 - 1.1  TSH     Status: None   Collection Time: 02/13/20 12:00 AM  Result Value Ref Range   TSH 0.72 0.41 - 5.90    Comment: free t4 1.92  TSH     Status: Abnormal   Collection  Time: 02/26/20  8:05 AM  Result Value Ref Range   TSH 8.57 (H) 0.40 - 4.50 mIU/L  T4, free     Status: None   Collection Time: 02/26/20  8:05 AM  Result Value Ref Range   Free T4 0.8 0.8 - 1.8 ng/dL  COMPLETE METABOLIC PANEL WITH GFR     Status: None   Collection Time: 02/26/20  8:05 AM  Result Value Ref Range   Glucose, Bld 80 65 - 99 mg/dL    Comment: .            Fasting reference interval .    BUN 13 7 - 25 mg/dL   Creat 0.87 0.50 - 0.99 mg/dL    Comment: For patients >67 years of age, the reference limit for Creatinine is approximately 13% higher for people identified as African-American. .    GFR, Est Non African American 71 > OR = 60 mL/min/1.66m   GFR, Est African American 83 > OR = 60 mL/min/1.732m  BUN/Creatinine Ratio NOT APPLICABLE 6 - 22 (calc)   Sodium 141 135 - 146 mmol/L   Potassium 4.1 3.5 - 5.3 mmol/L   Chloride 107 98 - 110 mmol/L   CO2 25 20 - 32 mmol/L   Calcium 8.9 8.6 - 10.4 mg/dL   Total Protein 6.6 6.1 - 8.1 g/dL   Albumin 3.7 3.6 - 5.1 g/dL   Globulin 2.9 1.9 - 3.7 g/dL (calc)   AG Ratio 1.3 1.0 - 2.5 (calc)   Total Bilirubin 0.2 0.2 - 1.2 mg/dL    Alkaline phosphatase (APISO) 73 37 - 153 U/L   AST 13 10 - 35 U/L   ALT 7 6 - 29 U/L  TSH     Status: Abnormal   Collection Time: 04/27/20 12:00 AM  Result Value Ref Range   TSH 20.40 (A) 0.41 - 5.90    Comment: free t4 1.02 ( 0.82-1.77)     Assessment & Plan:   1.  RAI induced hypothyroidism  2.  Graves' disease-resolved status post RAI treatment 3.  Disorder is state -Her previsit labs are consistent with inadequate replacement, and confirms the fact that she cannot come off of her thyroid hormone replacement.  I had a long discussion with her regarding this outcome .  I discussed and increased her Synthroid to 88 mcg daily before breakfast.   - We discussed about the correct intake of her thyroid hormone, on empty stomach at fasting, with water, separated by at least 30 minutes from breakfast and other medications,  and separated by more than 4 hours from calcium, iron, multivitamins, acid reflux medications (PPIs). -Patient is made aware of the fact that thyroid hormone replacement is needed for life, dose to be adjusted by periodic monitoring of thyroid function tests.  I explained to her in detail that her thyroid condition at this time is not a reason for her generalized anxiety state    Regarding her anxiety state, she will benefit from evaluation by psych for better medication.   - I advised patient to maintain close follow up with Sasser, PaSilvestre MomentMD for primary care needs.     - Time spent on this patient care encounter:  20 minutes of which 50% was spent in  counseling and the rest reviewing  her current and  previous labs / studies and medications  doses and developing a plan for long term care. AnOrlinda Blalockparticipated in the discussions, expressed understanding, and voiced agreement with  the above plans.  All questions were answered to her satisfaction. she is encouraged to contact clinic should she have any questions or concerns prior to her return  visit.   Follow up plan: Return in about 3 months (around 08/03/2020) for F/U with Pre-visit Labs.   Glade Lloyd, MD [facility] [address] [place], [place] [address] Phone: [phone]  Fax: [phone]     05/03/2020, 9:26 AM  This note was partially dictated with voice recognition software. Similar sounding words can be transcribed inadequately or may not  be corrected upon review.

## 2020-05-05 DIAGNOSIS — E538 Deficiency of other specified B group vitamins: Secondary | ICD-10-CM | POA: Diagnosis not present

## 2020-06-06 DIAGNOSIS — E538 Deficiency of other specified B group vitamins: Secondary | ICD-10-CM | POA: Diagnosis not present

## 2020-06-08 NOTE — Progress Notes (Deleted)
Psychiatric Initial Adult Assessment   Patient Identification: Isabella Scott MRN:  [id number] Date of Evaluation:  06/08/2020 Referral Source: *** Chief Complaint:   Visit Diagnosis: No diagnosis found.  History of Present Illness:   Isabella Scott is a 64 y.o. year old female with a history of depression, hyperthyroidism secondary to graves' Disease s/p  RAI ablation, Crohn's disease, who is referred for         Associated Signs/Symptoms: Depression Symptoms:  {DEPRESSION SYMPTOMS:20000} (Hypo) Manic Symptoms:  {BHH MANIC SYMPTOMS:22872} Anxiety Symptoms:  {BHH ANXIETY SYMPTOMS:22873} Psychotic Symptoms:  {BHH PSYCHOTIC SYMPTOMS:22874} PTSD Symptoms: {BHH PTSD SYMPTOMS:22875}  Past Psychiatric History:  Outpatient:  Psychiatry admission:  Previous suicide attempt:  Past trials of medication:  History of violence:   Previous Psychotropic Medications: {YES/NO:21197}  Substance Abuse History in the last 12 months:  {yes no:314532}  Consequences of Substance Abuse: {BHH CONSEQUENCES OF SUBSTANCE ABUSE:22880}  Past Medical History:  Past Medical History:  Diagnosis Date  . Anxiety   . Arthritis   . Crohn disease ([place])   . Dysphagia   . GERD (gastroesophageal reflux disease)   . 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 BUNIONECTOMY Left 2000  . BUNIONECTOMY Right 2005  . COLONOSCOPY N/A 2010  . COLONOSCOPY N/A 01/28/2017   Procedure: COLONOSCOPY;  Surgeon: Danie Binder, MD;  [facility];  Service: Endoscopy;  Laterality: N/A;  9:30am  . ESOPHAGOGASTRODUODENOSCOPY N/A 2010   with dilation  . PARTIAL COLECTOMY N/A 1981   terminal ileum resection    Family Psychiatric History: ***  Family History:  Family History  Problem Relation Age of Onset  . Heart disease Father   . Heart disease Brother   . Kidney disease Brother   . Throat cancer Maternal Uncle   . Lung cancer Maternal Grandfather    . Colon cancer Neg Hx   . Crohn's disease Neg Hx   . Ulcerative colitis Neg Hx     Social History:   Social History   Socioeconomic History  . Marital status: Married    Spouse name: Not on file  . Number of children: Not on file  . Years of education: Not on file  . Highest education level: Not on file  Occupational History  . Not on file  Tobacco Use  . Smoking status: Former Smoker    Types: Cigarettes    Quit date: 01/03/1997    Years since quitting: 23.4  . Smokeless tobacco: Never Used  Vaping Use  . Vaping Use: Never used  Substance and Sexual Activity  . Alcohol use: No  . Drug use: No  . Sexual activity: Not on file  Other Topics Concern  . Not on file  Social History Narrative  . Not on file   Social Determinants of Health   Financial Resource Strain:   . Difficulty of Paying Living Expenses:   Food Insecurity:   . Worried About Charity fundraiser in the Last Year:   . Arboriculturist in the Last Year:   Transportation Needs:   . Film/video editor (Medical):   Marland Kitchen Lack of Transportation (Non-Medical):   Physical Activity:   . Days of Exercise per Week:   . Minutes of Exercise per Session:   Stress:   . Feeling of Stress :   Social Connections:   . Frequency of Communication with Friends and Family:   . Frequency of Social  Gatherings with Friends and Family:   . Attends Religious Services:   . Active Member of Clubs or Organizations:   . Attends Archivist Meetings:   Marland Kitchen Marital Status:     Additional Social History: ***  Allergies:   Allergies  Allergen Reactions  . Meperidine     Funny feeling  . Morphine And Related     Funny feeling  . Sulfamethoxazole Itching and Rash    Metabolic Disorder Labs: No results found for: HGBA1C, MPG No results found for: PROLACTIN No results found for: CHOL, TRIG, HDL, CHOLHDL, VLDL, LDLCALC Lab Results  Component Value Date   TSH 20.40 (A) 04/27/2020    Therapeutic Level Labs: No  results found for: LITHIUM No results found for: CBMZ No results found for: VALPROATE  Current Medications: Current Outpatient Medications  Medication Sig Dispense Refill  . acetaminophen (TYLENOL) 500 MG tablet Take 500-1,000 mg by mouth daily as needed for moderate pain or headache.    Marland Kitchen aspirin EC 81 MG tablet Take 81 mg by mouth daily.    Marland Kitchen azaTHIOprine (IMURAN) 50 MG tablet Take 50 mg by mouth every evening.     Marland Kitchen buPROPion (WELLBUTRIN SR) 150 MG 12 hr tablet TK 1 T PO QD FOR 2 WEEKS THEN TK 1 T PO BID    . Calcium Carb-Cholecalciferol (CALCIUM 600+D) 600-800 MG-UNIT TABS Take 1 tablet by mouth daily.    . Chlorpheniramine-Phenylephrine (SINUS & ALLERGY PO) Take by mouth.    . citalopram (CELEXA) 20 MG tablet Take 40 mg by mouth daily.  0  . Cyanocobalamin (VITAMIN B-12 IJ) Inject 1 Dose as directed every 30 (thirty) days.     Marland Kitchen esomeprazole (NEXIUM) 40 MG capsule Take 40 mg by mouth daily at 12 noon.    Marland Kitchen LORazepam (ATIVAN) 0.5 MG tablet Take 0.5 mg by mouth 2 (two) times daily as needed for anxiety.     . metoprolol tartrate (LOPRESSOR) 25 MG tablet Take 25 mg by mouth daily.     . Multiple Vitamin (MULTIVITAMIN) capsule Take 1 capsule by mouth daily.    . Naphazoline-Pheniramine (OPCON-A OP) Apply 1 drop to eye daily as needed (allergies).    . potassium chloride (K-DUR,KLOR-CON) 10 MEQ tablet Take 10 mEq by mouth 2 (two) times daily.    Marland Kitchen SYNTHROID 88 MCG tablet Take 1 tablet (88 mcg total) by mouth daily before breakfast. 90 tablet 1  . Tetrahydrozoline HCl (VISINE OP) Apply 1 drop to eye daily as needed (dry eyes).    . valACYclovir (VALTREX) 500 MG tablet Take 500 mg by mouth 2 (two) times daily.     No current facility-administered medications for this visit.    Musculoskeletal: Strength & Muscle Tone: N/A Gait & Station: N/A Patient leans: N/A  Psychiatric Specialty Exam: Review of Systems  There were no vitals taken for this visit.There is no height or weight on file  to calculate BMI.  General Appearance: {Appearance:22683}  Eye Contact:  {BHH EYE CONTACT:22684}  Speech:  Clear and Coherent  Volume:  Normal  Mood:  {BHH MOOD:22306}  Affect:  {Affect (PAA):22687}  Thought Process:  Coherent  Orientation:  Full (Time, Place, and Person)  Thought Content:  Logical  Suicidal Thoughts:  {ST/HT (PAA):22692}  Homicidal Thoughts:  {ST/HT (PAA):22692}  Memory:  Immediate;   Good  Judgement:  {Judgement (PAA):22694}  Insight:  {Insight (PAA):22695}  Psychomotor Activity:  Normal  Concentration:  Concentration: Good and Attention Span: Good  Recall:  Good  Fund of Knowledge:Good  Language: Good  Akathisia:  No  Handed:  Right  AIMS (if indicated):  not done  Assets:  Communication Skills Desire for Improvement  ADL's:  Intact  Cognition: WNL  Sleep:  {BHH GOOD/FAIR/POOR:22877}   Screenings: PHQ2-9     Office Visit from 05/23/2018 in [facility] Office Visit from 03/06/2018 in [facility]  PHQ-2 Total Score 0 0      Assessment and Plan:    Plan  The patient demonstrates the following risk factors for suicide: Chronic risk factors for suicide include: {Chronic Risk Factors for BOFBPZW:25852778}. Acute risk factors for suicide include: {Acute Risk Factors for EUMPNTI:14431540}. Protective factors for this patient include: {Protective Factors for Suicide GQQP:61950932}. Considering these factors, the overall suicide risk at this point appears to be {Desc; low/moderate/high:110033}. Patient {ACTION; IS/IS IZT:24580998} appropriate for outpatient follow up.   Norman Clay, MD 7/28/20213:19 PM

## 2020-06-15 ENCOUNTER — Telehealth (HOSPITAL_COMMUNITY): Payer: Self-pay | Admitting: Psychiatry

## 2020-06-15 ENCOUNTER — Telehealth (HOSPITAL_COMMUNITY): Payer: Medicare PPO | Admitting: Psychiatry

## 2020-06-15 ENCOUNTER — Other Ambulatory Visit: Payer: Self-pay

## 2020-06-15 NOTE — Telephone Encounter (Signed)
She was unable to log in due to technical difficulties. The front desk to contact for rescheduling/guidance.

## 2020-07-04 DIAGNOSIS — F419 Anxiety disorder, unspecified: Secondary | ICD-10-CM | POA: Diagnosis not present

## 2020-07-07 DIAGNOSIS — E538 Deficiency of other specified B group vitamins: Secondary | ICD-10-CM | POA: Diagnosis not present

## 2020-07-14 DIAGNOSIS — F419 Anxiety disorder, unspecified: Secondary | ICD-10-CM | POA: Diagnosis not present

## 2020-07-21 DIAGNOSIS — F419 Anxiety disorder, unspecified: Secondary | ICD-10-CM | POA: Diagnosis not present

## 2020-07-27 DIAGNOSIS — E039 Hypothyroidism, unspecified: Secondary | ICD-10-CM | POA: Diagnosis not present

## 2020-07-27 LAB — TSH: TSH: 19.2 — AB (ref 0.41–5.90)

## 2020-07-28 DIAGNOSIS — F419 Anxiety disorder, unspecified: Secondary | ICD-10-CM | POA: Diagnosis not present

## 2020-08-01 ENCOUNTER — Telehealth: Payer: Self-pay

## 2020-08-01 NOTE — Telephone Encounter (Signed)
Patient is asking if she can be a phone visit on Wednesday  [phone]

## 2020-08-01 NOTE — Telephone Encounter (Signed)
Yes

## 2020-08-03 ENCOUNTER — Telehealth (INDEPENDENT_AMBULATORY_CARE_PROVIDER_SITE_OTHER): Payer: Medicare PPO | Admitting: "Endocrinology

## 2020-08-03 ENCOUNTER — Encounter: Payer: Self-pay | Admitting: "Endocrinology

## 2020-08-03 VITALS — Ht 65.0 in | Wt 176.8 lb

## 2020-08-03 DIAGNOSIS — E0500 Thyrotoxicosis with diffuse goiter without thyrotoxic crisis or storm: Secondary | ICD-10-CM

## 2020-08-03 DIAGNOSIS — E890 Postprocedural hypothyroidism: Secondary | ICD-10-CM

## 2020-08-03 MED ORDER — SYNTHROID 100 MCG PO TABS: 100.0000 ug | ORAL_TABLET | Freq: Every day | ORAL | 3 refills | Status: DC

## 2020-08-03 NOTE — Progress Notes (Signed)
Pt states she's experiencing increased anxiety and heart palpitations x 3-4 months.

## 2020-08-03 NOTE — Progress Notes (Signed)
08/03/2020, 12:55 PM                                                      Endocrinology Telehealth Visit Follow up Note -During COVID -19 Pandemic  This visit type was conducted  via telephone due to national recommendations for restrictions regarding the COVID-19 Pandemic  in an effort to limit this patient's exposure and mitigate transmission of the corona virus.   I connected with Isabella Scott on 08/03/2020   by telephone and verified that I am speaking with the correct person using two identifiers. Isabella Scott, 01-14-1956. she has verbally consented to this visit.  I was in my office and patient was in her residence. No other persons were with me during the encounter. All issues noted in this document were discussed and addressed. The format was not optimal for physical exam.     Subjective:    Patient ID: Isabella Scott, female    DOB: [birth year], PCP Sasser, Isabella Moment, MD   Past Medical History:  Diagnosis Date  . Anxiety   . Arthritis   . Crohn disease ([place])   . Dysphagia   . GERD (gastroesophageal reflux disease)   . 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 BUNIONECTOMY Left 2000  . BUNIONECTOMY Right 2005  . COLONOSCOPY N/A 2010  . COLONOSCOPY N/A 01/28/2017   Procedure: COLONOSCOPY;  Surgeon: Danie Binder, MD;  [facility];  Service: Endoscopy;  Laterality: N/A;  9:30am  . ESOPHAGOGASTRODUODENOSCOPY N/A 2010   with dilation  . PARTIAL COLECTOMY N/A 1981   terminal ileum resection   Social History   Socioeconomic History  . Marital status: Married    Spouse name: Not on file  . Number of children: Not on file  . Years of education: Not on file  . Highest education level: Not on file  Occupational History  . Not on file  Tobacco Use  . Smoking status: Former Smoker    Types: Cigarettes    Quit date: 01/03/1997    Years since quitting: 23.5  .  Smokeless tobacco: Never Used  Vaping Use  . Vaping Use: Never used  Substance and Sexual Activity  . Alcohol use: No  . Drug use: No  . Sexual activity: Not on file  Other Topics Concern  . Not on file  Social History Narrative  . Not on file   Social Determinants of Health   Financial Resource Strain:   . Difficulty of Paying Living Expenses: Not on file  Food Insecurity:   . Worried About Charity fundraiser in the Last Year: Not on file  . Ran Out of Food in the Last Year: Not on file  Transportation Needs:   . Lack of Transportation (Medical): Not on file  . Lack of Transportation (Non-Medical): Not on file  Physical Activity:   . Days of Exercise per Week: Not on file  . Minutes of Exercise per Session: Not on file  Stress:   . Feeling  of Stress : Not on file  Social Connections:   . Frequency of Communication with Friends and Family: Not on file  . Frequency of Social Gatherings with Friends and Family: Not on file  . Attends Religious Services: Not on file  . Active Member of Clubs or Organizations: Not on file  . Attends Archivist Meetings: Not on file  . Marital Status: Not on file   Outpatient Encounter Medications as of 08/03/2020  Medication Sig  . acetaminophen (TYLENOL) 500 MG tablet Take 500-1,000 mg by mouth daily as needed for moderate pain or headache.  Marland Kitchen aspirin EC 81 MG tablet Take 81 mg by mouth daily.  Marland Kitchen azaTHIOprine (IMURAN) 50 MG tablet Take 50 mg by mouth every evening.   Marland Kitchen buPROPion (WELLBUTRIN SR) 150 MG 12 hr tablet TK 1 T PO QD FOR 2 WEEKS THEN TK 1 T PO BID  . Calcium Carb-Cholecalciferol (CALCIUM 600+D) 600-800 MG-UNIT TABS Take 1 tablet by mouth daily.  . Chlorpheniramine-Phenylephrine (SINUS & ALLERGY PO) Take by mouth.  . citalopram (CELEXA) 20 MG tablet Take 40 mg by mouth daily.  . Cyanocobalamin (VITAMIN B-12 IJ) Inject 1 Dose as directed every 30 (thirty) days.   Marland Kitchen LORazepam (ATIVAN) 0.5 MG tablet Take 0.5 mg by mouth 2  (two) times daily as needed for anxiety.   . metoprolol tartrate (LOPRESSOR) 25 MG tablet Take 25 mg by mouth daily. Take 2 tablets daily  . Multiple Vitamin (MULTIVITAMIN) capsule Take 1 capsule by mouth daily.  . Naphazoline-Pheniramine (OPCON-A OP) Apply 1 drop to eye daily as needed (allergies).  . pantoprazole (PROTONIX) 40 MG tablet Take 1 tablet by mouth daily.  . potassium chloride (K-DUR,KLOR-CON) 10 MEQ tablet Take 10 mEq by mouth 2 (two) times daily.  Marland Kitchen SYNTHROID 100 MCG tablet Take 1 tablet (100 mcg total) by mouth daily before breakfast.  . Tetrahydrozoline HCl (VISINE OP) Apply 1 drop to eye daily as needed (dry eyes).  . valACYclovir (VALTREX) 500 MG tablet Take 500 mg by mouth 2 (two) times daily. As needed  . [DISCONTINUED] esomeprazole (NEXIUM) 40 MG capsule Take 40 mg by mouth daily at 12 noon.  . [DISCONTINUED] SYNTHROID 88 MCG tablet Take 1 tablet (88 mcg total) by mouth daily before breakfast.   No facility-administered encounter medications on file as of 08/03/2020.   ALLERGIES: Allergies  Allergen Reactions  . Meperidine     Funny feeling  . Morphine And Related     Funny feeling  . Sulfamethoxazole Itching and Rash    VACCINATION STATUS: Immunization History  Administered Date(s) Administered  . Influenza-Unspecified 08/12/2018    HPI [name] is 64 y.o. female who is status post  RAI thyroid ablation for Graves' disease in May 2019.  -She was subsequently initiated on thyroid hormone replacement, dose was progressively adjusted to 137 mcg p.o. every morning.  Subsequently, she developed unexplained anxiety for which she was seen during her last  2 visits here. -She could not be convinced with the fact that her thyroid hormone replacement is not the major cause of her anxiety.  Her thyroid hormone dose was lowered, and currently on 88 mcg p.o. daily.  She still has anxiety, her previsit thyroid function tests are consistent with under replacement.  She  reports compliance to her medications, does not skip any doses.  She relates her anxiety to the care she gives to her ailing mother.    She still has symptoms including generalized anxiety, intermittent palpitations,  fluctuating weight.   She denies chest pain.  -She denies dysphagia, shortness of breath, change in her voice.  She denies cold/hot intolerance.  She denies any family history of thyroid cancer.    Review of Systems -Limited as above.    Objective:    Ht 5' 5"  (1.651 m)   Wt 176 lb 12.8 oz (80.2 kg)   BMI 29.42 kg/m   Wt Readings from Last 3 Encounters:  08/03/20 176 lb 12.8 oz (80.2 kg)  05/03/20 184 lb 3.2 oz (83.6 kg)  02/29/20 187 lb 9.6 oz (85.1 kg)    Physical Exam  Patient is anxious, tearful. CMP     Component Value Date/Time   NA 141 02/26/2020 0805   K 4.1 02/26/2020 0805   CL 107 02/26/2020 0805   CO2 25 02/26/2020 0805   GLUCOSE 80 02/26/2020 0805   BUN 13 02/26/2020 0805   BUN 7 02/13/2020 0000   CREATININE 0.87 02/26/2020 0805   CALCIUM 8.9 02/26/2020 0805   PROT 6.6 02/26/2020 0805   ALBUMIN 4.0 01/16/2017 1302   AST 13 02/26/2020 0805   ALT 7 02/26/2020 0805   ALKPHOS 82 01/16/2017 1302   BILITOT 0.2 02/26/2020 0805   GFRNONAA 71 02/26/2020 0805   GFRAA 83 02/26/2020 0805   Recent Results (from the past 2160 hour(s))  TSH     Status: Abnormal   Collection Time: 07/27/20 12:00 AM  Result Value Ref Range   TSH 19.20 (A) 0.41 - 5.90    Comment: FREE T4- 1.08     May 16, 2018 labs showed TSH <0.006, free T4 1.74  Her thyroid ultrasound on August 15, 2017 showed right lobe 4.3 cm and left lobe 4.1 cm.  Scattered bilateral small nodules, largest on the right lobe 0.5 cm.  Repeat thyroid ultrasound from February 28, 2018 is unremarkable, no nodular lesions.  Recent Results (from the past 2160 hour(s))  TSH     Status: Abnormal   Collection Time: 07/27/20 12:00 AM  Result Value Ref Range   TSH 19.20 (A) 0.41 - 5.90    Comment: FREE  T4- 1.08     Assessment & Plan:   1.  RAI induced hypothyroidism  2.  Graves' disease-resolved status post RAI treatment 3.  Disorder is state -Her previsit labs are consistent with inadequate replacement, and confirms the fact that she cannot come off of her thyroid hormone replacement.  I had a long discussion with her regarding this outcome .  She is in agreement to increase it to the next higher dose.  I discussed and prescribed Synthroid 100 mcg p.o. daily before breakfast.    - We discussed about the correct intake of her thyroid hormone, on empty stomach at fasting, with water, separated by at least 30 minutes from breakfast and other medications,  and separated by more than 4 hours from calcium, iron, multivitamins, acid reflux medications (PPIs). -Patient is made aware of the fact that thyroid hormone replacement is needed for life, dose to be adjusted by periodic monitoring of thyroid function tests.  I explained to her in detail that her thyroid condition at this time is not a reason for her generalized anxiety state    Regarding her anxiety state, she will benefit from evaluation by psych for better medication.   - I advised patient to maintain close follow up with Sasser, Isabella Moment, MD for primary care needs.     - Time spent on this patient care encounter:  20  minutes of which 50% was spent in  counseling and the rest reviewing  her current and  previous labs / studies and medications  doses and developing a plan for long term care. Isabella Scott  participated in the discussions, expressed understanding, and voiced agreement with the above plans.  All questions were answered to her satisfaction. she is encouraged to contact clinic should she have any questions or concerns prior to her return visit.  Follow up plan: Return in about 3 months (around 11/02/2020) for F/U with Pre-visit Labs.   Glade Lloyd, MD [facility] [address] [place], [place] [address] Phone: [phone]  Fax: [phone]     08/03/2020, 12:55 PM  This note was partially dictated with voice recognition software. Similar sounding words can be transcribed inadequately or may not  be corrected upon review.

## 2020-08-08 DIAGNOSIS — E538 Deficiency of other specified B group vitamins: Secondary | ICD-10-CM | POA: Diagnosis not present

## 2020-08-11 DIAGNOSIS — F419 Anxiety disorder, unspecified: Secondary | ICD-10-CM | POA: Diagnosis not present

## 2020-08-12 DIAGNOSIS — Z23 Encounter for immunization: Secondary | ICD-10-CM | POA: Diagnosis not present

## 2020-08-16 DIAGNOSIS — R5383 Other fatigue: Secondary | ICD-10-CM | POA: Diagnosis not present

## 2020-08-16 DIAGNOSIS — K5080 Crohn's disease of both small and large intestine without complications: Secondary | ICD-10-CM | POA: Diagnosis not present

## 2020-08-16 DIAGNOSIS — M19012 Primary osteoarthritis, left shoulder: Secondary | ICD-10-CM | POA: Diagnosis not present

## 2020-08-16 DIAGNOSIS — I10 Essential (primary) hypertension: Secondary | ICD-10-CM | POA: Diagnosis not present

## 2020-08-16 DIAGNOSIS — M898X1 Other specified disorders of bone, shoulder: Secondary | ICD-10-CM | POA: Diagnosis not present

## 2020-08-16 DIAGNOSIS — F419 Anxiety disorder, unspecified: Secondary | ICD-10-CM | POA: Diagnosis not present

## 2020-08-16 DIAGNOSIS — Z6827 Body mass index (BMI) 27.0-27.9, adult: Secondary | ICD-10-CM | POA: Diagnosis not present

## 2020-08-16 DIAGNOSIS — R002 Palpitations: Secondary | ICD-10-CM | POA: Diagnosis not present

## 2020-08-16 DIAGNOSIS — F410 Panic disorder [episodic paroxysmal anxiety] without agoraphobia: Secondary | ICD-10-CM | POA: Diagnosis not present

## 2020-08-16 DIAGNOSIS — E039 Hypothyroidism, unspecified: Secondary | ICD-10-CM | POA: Diagnosis not present

## 2020-08-18 DIAGNOSIS — F410 Panic disorder [episodic paroxysmal anxiety] without agoraphobia: Secondary | ICD-10-CM | POA: Diagnosis not present

## 2020-08-18 DIAGNOSIS — F419 Anxiety disorder, unspecified: Secondary | ICD-10-CM | POA: Diagnosis not present

## 2020-08-22 DIAGNOSIS — R002 Palpitations: Secondary | ICD-10-CM | POA: Diagnosis not present

## 2020-08-22 DIAGNOSIS — F419 Anxiety disorder, unspecified: Secondary | ICD-10-CM | POA: Diagnosis not present

## 2020-08-22 DIAGNOSIS — Z6826 Body mass index (BMI) 26.0-26.9, adult: Secondary | ICD-10-CM | POA: Diagnosis not present

## 2020-08-22 DIAGNOSIS — R634 Abnormal weight loss: Secondary | ICD-10-CM | POA: Diagnosis not present

## 2020-08-22 DIAGNOSIS — F331 Major depressive disorder, recurrent, moderate: Secondary | ICD-10-CM | POA: Diagnosis not present

## 2020-08-25 DIAGNOSIS — F419 Anxiety disorder, unspecified: Secondary | ICD-10-CM | POA: Diagnosis not present

## 2020-09-01 DIAGNOSIS — F419 Anxiety disorder, unspecified: Secondary | ICD-10-CM | POA: Diagnosis not present

## 2020-09-07 DIAGNOSIS — F419 Anxiety disorder, unspecified: Secondary | ICD-10-CM | POA: Diagnosis not present

## 2020-09-07 DIAGNOSIS — Z6826 Body mass index (BMI) 26.0-26.9, adult: Secondary | ICD-10-CM | POA: Diagnosis not present

## 2020-09-07 DIAGNOSIS — R634 Abnormal weight loss: Secondary | ICD-10-CM | POA: Diagnosis not present

## 2020-09-07 DIAGNOSIS — F331 Major depressive disorder, recurrent, moderate: Secondary | ICD-10-CM | POA: Diagnosis not present

## 2020-09-07 DIAGNOSIS — R002 Palpitations: Secondary | ICD-10-CM | POA: Diagnosis not present

## 2020-09-07 DIAGNOSIS — E538 Deficiency of other specified B group vitamins: Secondary | ICD-10-CM | POA: Diagnosis not present

## 2020-09-12 DIAGNOSIS — F419 Anxiety disorder, unspecified: Secondary | ICD-10-CM | POA: Diagnosis not present

## 2020-09-12 DIAGNOSIS — E039 Hypothyroidism, unspecified: Secondary | ICD-10-CM | POA: Diagnosis not present

## 2020-09-12 DIAGNOSIS — Z6826 Body mass index (BMI) 26.0-26.9, adult: Secondary | ICD-10-CM | POA: Diagnosis not present

## 2020-09-12 DIAGNOSIS — R002 Palpitations: Secondary | ICD-10-CM | POA: Diagnosis not present

## 2020-09-29 DIAGNOSIS — F419 Anxiety disorder, unspecified: Secondary | ICD-10-CM | POA: Diagnosis not present

## 2020-10-10 DIAGNOSIS — F419 Anxiety disorder, unspecified: Secondary | ICD-10-CM | POA: Diagnosis not present

## 2020-10-10 DIAGNOSIS — F410 Panic disorder [episodic paroxysmal anxiety] without agoraphobia: Secondary | ICD-10-CM | POA: Diagnosis not present

## 2020-10-10 DIAGNOSIS — K5080 Crohn's disease of both small and large intestine without complications: Secondary | ICD-10-CM | POA: Diagnosis not present

## 2020-10-10 DIAGNOSIS — Z6826 Body mass index (BMI) 26.0-26.9, adult: Secondary | ICD-10-CM | POA: Diagnosis not present

## 2020-10-10 DIAGNOSIS — E039 Hypothyroidism, unspecified: Secondary | ICD-10-CM | POA: Diagnosis not present

## 2020-10-10 DIAGNOSIS — D518 Other vitamin B12 deficiency anemias: Secondary | ICD-10-CM | POA: Diagnosis not present

## 2020-10-10 DIAGNOSIS — R002 Palpitations: Secondary | ICD-10-CM | POA: Diagnosis not present

## 2020-10-14 DIAGNOSIS — R002 Palpitations: Secondary | ICD-10-CM | POA: Diagnosis not present

## 2020-10-14 DIAGNOSIS — E039 Hypothyroidism, unspecified: Secondary | ICD-10-CM | POA: Diagnosis not present

## 2020-10-14 DIAGNOSIS — R634 Abnormal weight loss: Secondary | ICD-10-CM | POA: Diagnosis not present

## 2020-10-14 DIAGNOSIS — K5080 Crohn's disease of both small and large intestine without complications: Secondary | ICD-10-CM | POA: Diagnosis not present

## 2020-10-14 DIAGNOSIS — F419 Anxiety disorder, unspecified: Secondary | ICD-10-CM | POA: Diagnosis not present

## 2020-10-14 DIAGNOSIS — F410 Panic disorder [episodic paroxysmal anxiety] without agoraphobia: Secondary | ICD-10-CM | POA: Diagnosis not present

## 2020-10-14 DIAGNOSIS — Z6826 Body mass index (BMI) 26.0-26.9, adult: Secondary | ICD-10-CM | POA: Diagnosis not present

## 2020-10-25 DIAGNOSIS — Z79899 Other long term (current) drug therapy: Secondary | ICD-10-CM | POA: Diagnosis not present

## 2020-10-25 DIAGNOSIS — F321 Major depressive disorder, single episode, moderate: Secondary | ICD-10-CM | POA: Diagnosis not present

## 2020-10-25 DIAGNOSIS — E890 Postprocedural hypothyroidism: Secondary | ICD-10-CM | POA: Diagnosis not present

## 2020-10-25 DIAGNOSIS — F411 Generalized anxiety disorder: Secondary | ICD-10-CM | POA: Diagnosis not present

## 2020-10-26 LAB — TSH: TSH: 3.45 (ref 0.41–5.90)

## 2020-11-02 ENCOUNTER — Encounter: Payer: Self-pay | Admitting: "Endocrinology

## 2020-11-02 ENCOUNTER — Ambulatory Visit (INDEPENDENT_AMBULATORY_CARE_PROVIDER_SITE_OTHER): Payer: Medicare PPO | Admitting: "Endocrinology

## 2020-11-02 ENCOUNTER — Other Ambulatory Visit: Payer: Self-pay

## 2020-11-02 VITALS — BP 146/75 | HR 91 | Ht 66.0 in | Wt 165.2 lb

## 2020-11-02 DIAGNOSIS — E0500 Thyrotoxicosis with diffuse goiter without thyrotoxic crisis or storm: Secondary | ICD-10-CM | POA: Diagnosis not present

## 2020-11-02 DIAGNOSIS — E890 Postprocedural hypothyroidism: Secondary | ICD-10-CM

## 2020-11-02 MED ORDER — LEVOTHYROXINE SODIUM 100 MCG PO TABS: 100.0000 ug | ORAL_TABLET | Freq: Every day | ORAL | 1 refills | Status: DC

## 2020-11-02 NOTE — Progress Notes (Signed)
11/02/2020, 8:40 AM                                     Endocrinology follow-up note   Subjective:    Patient ID: Isabella Scott, female    DOB: 1956/11/09, PCP Sasser, Silvestre Moment, MD   Past Medical History:  Diagnosis Date  . Anxiety   . Arthritis   . Crohn disease ([place])   . Dysphagia   . GERD (gastroesophageal reflux disease)   . 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 BUNIONECTOMY Left 2000  . BUNIONECTOMY Right 2005  . COLONOSCOPY N/A 2010  . COLONOSCOPY N/A 01/28/2017   Procedure: COLONOSCOPY;  Surgeon: Isabella Binder, MD;  [facility];  Service: Endoscopy;  Laterality: N/A;  9:30am  . ESOPHAGOGASTRODUODENOSCOPY N/A 2010   with dilation  . PARTIAL COLECTOMY N/A 1981   terminal ileum resection   Social History   Socioeconomic History  . Marital status: Married    Spouse name: Not on file  . Number of children: Not on file  . Years of education: Not on file  . Highest education level: Not on file  Occupational History  . Not on file  Tobacco Use  . Smoking status: Former Smoker    Types: Cigarettes    Quit date: 01/03/1997    Years since quitting: 23.8  . Smokeless tobacco: Never Used  Vaping Use  . Vaping Use: Never used  Substance and Sexual Activity  . Alcohol use: No  . Drug use: No  . Sexual activity: Not on file  Other Topics Concern  . Not on file  Social History Narrative  . Not on file   Social Determinants of Health   Financial Resource Strain: Not on file  Food Insecurity: Not on file  Transportation Needs: Not on file  Physical Activity: Not on file  Stress: Not on file  Social Connections: Not on file   Outpatient Encounter Medications as of 11/02/2020  Medication Sig  . acetaminophen (TYLENOL) 500 MG tablet Take 500-1,000 mg by mouth daily as needed for moderate pain or headache.  Marland Kitchen aspirin EC 81 MG tablet Take 81 mg by  mouth daily.  Marland Kitchen azaTHIOprine (IMURAN) 50 MG tablet Take 50 mg by mouth every evening.   Marland Kitchen buPROPion (WELLBUTRIN SR) 150 MG 12 hr tablet Take 0.5 mg by mouth daily.  . Calcium Carb-Cholecalciferol (CALCIUM 600+D) 600-800 MG-UNIT TABS Take 1 tablet by mouth daily.  . Chlorpheniramine-Phenylephrine (SINUS & ALLERGY PO) Take by mouth.  . citalopram (CELEXA) 20 MG tablet Take 40 mg by mouth daily.  . Cyanocobalamin (VITAMIN B-12 IJ) Inject 1 Dose as directed every 30 (thirty) days.   Marland Kitchen levothyroxine (SYNTHROID) 100 MCG tablet Take 1 tablet (100 mcg total) by mouth daily.  Marland Kitchen LORazepam (ATIVAN) 0.5 MG tablet Take 0.5 mg by mouth 2 (two) times daily as needed for anxiety.   . metoprolol tartrate (LOPRESSOR) 25 MG tablet Take 25 mg by mouth daily. Take 2 tablets daily  . mirtazapine (REMERON) 7.5 MG tablet Take 7.5 mg by mouth at bedtime.  . Multiple  Vitamin (MULTIVITAMIN) capsule Take 1 capsule by mouth daily.  . Naphazoline-Pheniramine (OPCON-A OP) Apply 1 drop to eye daily as needed (allergies).  . pantoprazole (PROTONIX) 40 MG tablet Take 1 tablet by mouth daily.  . potassium chloride (K-DUR,KLOR-CON) 10 MEQ tablet Take 10 mEq by mouth 2 (two) times daily.  . Tetrahydrozoline HCl (VISINE OP) Apply 1 drop to eye daily as needed (dry eyes).  . valACYclovir (VALTREX) 500 MG tablet Take 500 mg by mouth 2 (two) times daily. As needed  . [DISCONTINUED] SYNTHROID 100 MCG tablet Take 1 tablet (100 mcg total) by mouth daily before breakfast.   No facility-administered encounter medications on file as of 11/02/2020.   ALLERGIES: Allergies  Allergen Reactions  . Meperidine     Funny feeling  . Morphine And Related     Funny feeling  . Sulfamethoxazole Itching and Rash    VACCINATION STATUS: Immunization History  Administered Date(s) Administered  . Influenza-Unspecified 08/12/2018    HPI Isabella Scott is 64 y.o. female who is status post  RAI thyroid ablation for Graves' disease in May 2019.   -She was subsequently initiated on thyroid hormone replacement, dose was progressively adjusted to 137 mcg p.o. every morning.  Subsequently, she developed unexplained anxiety which led to progressively lowering the dose of her levothyroxine.  She is currently on Synthroid 100 mcg p.o. daily before breakfast.  She returns with thyroid function tests within normal range.  She has no new complaints.  She lost approximately 10 pounds since last visit.   She is grieving the death of her mother.    She reports compliance to her medications, does not skip any doses.    -She denies dysphagia, shortness of breath, change in her voice.  She denies cold/hot intolerance.  She denies any family history of thyroid cancer.    Review of Systems -Limited as above.    Objective:    BP (!) 146/75   Pulse 91   Ht 5' 6"  (1.676 m)   Wt 165 lb 3.2 oz (74.9 kg)   BMI 26.66 kg/m   Wt Readings from Last 3 Encounters:  11/02/20 165 lb 3.2 oz (74.9 kg)  08/03/20 176 lb 12.8 oz (80.2 kg)  05/03/20 184 lb 3.2 oz (83.6 kg)    Physical Exam  Patient is anxious, tearful. CMP     Component Value Date/Time   NA 141 02/26/2020 0805   K 4.1 02/26/2020 0805   CL 107 02/26/2020 0805   CO2 25 02/26/2020 0805   GLUCOSE 80 02/26/2020 0805   BUN 13 02/26/2020 0805   BUN 7 02/13/2020 0000   CREATININE 0.87 02/26/2020 0805   CALCIUM 8.9 02/26/2020 0805   PROT 6.6 02/26/2020 0805   ALBUMIN 4.0 01/16/2017 1302   AST 13 02/26/2020 0805   ALT 7 02/26/2020 0805   ALKPHOS 82 01/16/2017 1302   BILITOT 0.2 02/26/2020 0805   GFRNONAA 71 02/26/2020 0805   GFRAA 83 02/26/2020 0805   Recent Results (from the past 2160 hour(s))  TSH     Status: None   Collection Time: 10/25/20 12:00 AM  Result Value Ref Range   TSH 3.45 0.41 - 5.90    Comment: T4- 8.5     May 16, 2018 labs showed TSH <0.006, free T4 1.74  Her thyroid ultrasound on August 15, 2017 showed right lobe 4.3 cm and left lobe 4.1 cm.  Scattered  bilateral small nodules, largest on the right lobe 0.5 cm.  Repeat thyroid ultrasound  from February 28, 2018 is unremarkable, no nodular lesions.  Recent Results (from the past 2160 hour(s))  TSH     Status: None   Collection Time: 10/25/20 12:00 AM  Result Value Ref Range   TSH 3.45 0.41 - 5.90    Comment: T4- 8.5     Assessment & Plan:   1.  RAI induced hypothyroidism  2.  Graves' disease-resolved status post RAI treatment  -Her previsit labs are consistent with safe and adequate replacement.  She is advised to continue levothyroxine 100 mcg p.o. daily before breakfast until next measurement in 6 months.    - We discussed about the correct intake of her thyroid hormone, on empty stomach at fasting, with water, separated by at least 30 minutes from breakfast and other medications,  and separated by more than 4 hours from calcium, iron, multivitamins, acid reflux medications (PPIs). -Patient is made aware of the fact that thyroid hormone replacement is needed for life, dose to be adjusted by periodic monitoring of thyroid function tests.    - I advised patient to maintain close follow up with Sasser, Silvestre Moment, MD for primary care needs.     - Time spent on this patient care encounter:  20 minutes of which 50% was spent in  counseling and the rest reviewing  her current and  previous labs / studies and medications  doses and developing a plan for long term care. Isabella Scott  participated in the discussions, expressed understanding, and voiced agreement with the above plans.  All questions were answered to her satisfaction. she is encouraged to contact clinic should she have any questions or concerns prior to her return visit.   Follow up plan: Return in about 6 months (around 05/03/2021) for F/U with Pre-visit Labs.   Glade Lloyd, MD [facility] [address] [place], [place] [address] Phone: [phone]  Fax: [phone]      11/02/2020, 8:40 AM  This note was partially dictated with voice recognition software. Similar sounding words can be transcribed inadequately or may not  be corrected upon review.

## 2020-11-07 DIAGNOSIS — Z8719 Personal history of other diseases of the digestive system: Secondary | ICD-10-CM | POA: Diagnosis not present

## 2020-11-07 DIAGNOSIS — Z01411 Encounter for gynecological examination (general) (routine) with abnormal findings: Secondary | ICD-10-CM | POA: Diagnosis not present

## 2020-11-07 DIAGNOSIS — L900 Lichen sclerosus et atrophicus: Secondary | ICD-10-CM | POA: Diagnosis not present

## 2020-11-07 DIAGNOSIS — N952 Postmenopausal atrophic vaginitis: Secondary | ICD-10-CM | POA: Diagnosis not present

## 2020-11-09 DIAGNOSIS — E538 Deficiency of other specified B group vitamins: Secondary | ICD-10-CM | POA: Diagnosis not present

## 2020-11-23 ENCOUNTER — Encounter: Payer: Self-pay | Admitting: Cardiology

## 2020-11-23 ENCOUNTER — Ambulatory Visit: Payer: Self-pay | Admitting: Cardiology

## 2020-11-23 ENCOUNTER — Ambulatory Visit: Payer: Medicare PPO | Admitting: Cardiology

## 2020-11-23 VITALS — BP 142/82 | HR 91 | Ht 65.0 in | Wt 169.0 lb

## 2020-11-23 DIAGNOSIS — R002 Palpitations: Secondary | ICD-10-CM | POA: Diagnosis not present

## 2020-11-23 NOTE — Patient Instructions (Addendum)

## 2020-11-23 NOTE — Progress Notes (Signed)
Cardiology Office Note  Date: 11/23/2020   ID: Isabella Scott, DOB 11/27/1955, MRN [id number]  PCP:  Manon Hilding, MD  Cardiologist:  Rozann Lesches, MD Electrophysiologist:  None   Chief Complaint  Patient presents with  . Palpitations    History of Present Illness: Isabella Scott is a 65 y.o. female referred for cardiology consultation by Dr. Quintin Alto for the evaluation of palpitations.  In talking with her today, it is evident that she is under a tremendous amount of stress and suffering with significant anxiety.  She has established with a behavioral health provider locally.  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  Her twin brother is now living alone, divorced, and she is trying to help out with his stressful situation.  She was tearful discussing all of these issues today.  As far as the palpitations are concerned, she states that when she feels anxious and stressed, her heart rate increases.  It is never the other way around, she never has spontaneous palpitations otherwise.  She has had no syncope or chest pain.  I personally reviewed her ECG today which shows normal sinus rhythm, normal intervals.  I reviewed her medications which are outlined below.  Past Medical History:  Diagnosis Date  . Anxiety   . Arthritis   . Crohn's disease ([place])   . Dysphagia   . GERD (gastroesophageal reflux disease)   . Hypothyroidism   . 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 BUNIONECTOMY Left 2000  . BUNIONECTOMY Right 2005  . COLONOSCOPY N/A 2010  . COLONOSCOPY N/A 01/28/2017   Procedure: COLONOSCOPY;  Surgeon: Danie Binder, MD;  Location[facility];  Service: Endoscopy;  Laterality: N/A;  9:30am  . ESOPHAGOGASTRODUODENOSCOPY N/A 2010   with dilation  . PARTIAL COLECTOMY N/A 1981   terminal ileum resection     Current Outpatient Medications  Medication Sig Dispense Refill  . acetaminophen (TYLENOL) 500 MG tablet Take 500-1,000 mg by mouth daily as needed for moderate pain or headache.    Marland Kitchen aspirin EC 81 MG tablet Take 81 mg by mouth daily.    Marland Kitchen azaTHIOprine (IMURAN) 50 MG tablet Take 50 mg by mouth every evening.     Marland Kitchen buPROPion (WELLBUTRIN SR) 150 MG 12 hr tablet Take 0.5 mg by mouth daily.    . Calcium Carb-Cholecalciferol 600-800 MG-UNIT TABS Take 1 tablet by mouth daily.    . Chlorpheniramine-Phenylephrine (SINUS & ALLERGY PO) Take by mouth.    . citalopram (CELEXA) 20 MG tablet Take 40 mg by mouth daily.  0  . Cyanocobalamin (VITAMIN B-12 IJ) Inject 1 Dose as directed every 30 (thirty) days.     Marland Kitchen levothyroxine (SYNTHROID) 100 MCG tablet Take 1 tablet (100 mcg total) by mouth daily. 90 tablet 1  . LORazepam (ATIVAN) 0.5 MG tablet Take 0.5 mg by mouth in the morning, at noon, and at bedtime.    . metoprolol tartrate (LOPRESSOR) 25 MG tablet Take 25 mg by mouth daily. Take 2 tablets daily    . mirtazapine (REMERON) 7.5 MG tablet Take 7.5 mg by mouth at bedtime.    . Multiple Vitamin (MULTIVITAMIN) capsule Take 1 capsule by mouth daily.    . Naphazoline-Pheniramine (OPCON-A OP) Apply 1 drop to eye daily as needed (allergies).    . pantoprazole (PROTONIX)  40 MG tablet Take 1 tablet by mouth daily.    . potassium chloride (K-DUR,KLOR-CON) 10 MEQ tablet Take 10 mEq by mouth 2 (two) times daily.    . Tetrahydrozoline HCl (VISINE OP) Apply 1 drop to eye daily as needed (dry eyes).    . valACYclovir (VALTREX) 500 MG tablet Take 500 mg by mouth 2 (two) times daily. As needed     No current facility-administered medications for this visit.   Allergies:  Meperidine, Morphine and related, and Sulfamethoxazole   Social History: The patient  reports that she quit smoking about 23 years ago. Her smoking use included cigarettes. She has never used smokeless tobacco. She reports that she does not drink  alcohol and does not use drugs.   Family History: The patient's family history includes Heart disease in her brother and father; Kidney disease in her brother; Lung cancer in her maternal grandfather; Throat cancer in her maternal uncle.   ROS: No syncope.  Physical Exam: VS:  BP (!) 142/82   Pulse 91   Ht 5' 5"  (1.651 m)   Wt 169 lb (76.7 kg)   SpO2 99%   BMI 28.12 kg/m , BMI Body mass index is 28.12 kg/m.  Wt Readings from Last 3 Encounters:  11/23/20 169 lb (76.7 kg)  11/02/20 165 lb 3.2 oz (74.9 kg)  08/03/20 176 lb 12.8 oz (80.2 kg)    General: Patient appears comfortable at rest. HEENT: Conjunctiva and lids normal, wearing a mask. Neck: Supple, no elevated JVP or carotid bruits, no thyromegaly. Lungs: Clear to auscultation, nonlabored breathing at rest. Cardiac: Regular rate and rhythm, no S3 or significant systolic murmur, no pericardial rub. Extremities: No pitting edema, distal pulses 2+. Skin: Warm and dry. Musculoskeletal: No kyphosis. Neuropsychiatric: Alert and oriented x3, tearful.  ECG:  An ECG dated 08/22/2020 was personally reviewed today and demonstrated:  Normal sinus rhythm.  Recent Labwork: 02/26/2020: ALT 7; AST 13; BUN 13; Creat 0.87; Potassium 4.1; Sodium 141 10/25/2020: TSH 3.45  May 2021: Troponin I normal, pro-BNP 57, hemoglobin 13.5, platelets 230, potassium 3.5, BUN 7, creatinine 0.88, TSH 29.69  Other Studies Reviewed Today:  No prior cardiac testing for review today.  Assessment and Plan:  Sensation of increased heart rate during times of stress and anxiety.  She does not describe any sudden onset of palpitations in other settings, no chest pain or syncope.  Her ECG is normal today.  No pathological murmurs or gallop on examination.  At this point would continue working through management strategies for her stress and anxiety, she has established with a behavioral health specialist.  If there is a distinct sense that palpitations and/or  increased heart rate does not settle down, could always consider further monitoring.  Keep follow-up with Dr. Quintin Alto.  Medication Adjustments/Labs and Tests Ordered: Current medicines are reviewed at length with the patient today.  Concerns regarding medicines are outlined above.   Tests Ordered: Orders Placed This Encounter  Procedures  . EKG 12-Lead    Medication Changes: No orders of the defined types were placed in this encounter.   Disposition:  Follow up prn  Signed, Satira Sark, MD, [facility] 11/23/2020 11:08 AM    [place] [facility] [place], [place], [place] [address] Phone: [phone]; Fax: [phone]

## 2020-11-24 DIAGNOSIS — Z6827 Body mass index (BMI) 27.0-27.9, adult: Secondary | ICD-10-CM | POA: Diagnosis not present

## 2020-11-24 DIAGNOSIS — E039 Hypothyroidism, unspecified: Secondary | ICD-10-CM | POA: Diagnosis not present

## 2020-11-24 DIAGNOSIS — K5080 Crohn's disease of both small and large intestine without complications: Secondary | ICD-10-CM | POA: Diagnosis not present

## 2020-11-24 DIAGNOSIS — F419 Anxiety disorder, unspecified: Secondary | ICD-10-CM | POA: Diagnosis not present

## 2020-11-24 DIAGNOSIS — R002 Palpitations: Secondary | ICD-10-CM | POA: Diagnosis not present

## 2020-11-24 DIAGNOSIS — F410 Panic disorder [episodic paroxysmal anxiety] without agoraphobia: Secondary | ICD-10-CM | POA: Diagnosis not present

## 2020-12-08 DIAGNOSIS — E538 Deficiency of other specified B group vitamins: Secondary | ICD-10-CM | POA: Diagnosis not present

## 2021-01-06 DIAGNOSIS — E538 Deficiency of other specified B group vitamins: Secondary | ICD-10-CM | POA: Diagnosis not present

## 2021-01-09 DIAGNOSIS — L900 Lichen sclerosus et atrophicus: Secondary | ICD-10-CM | POA: Diagnosis not present

## 2021-01-17 DIAGNOSIS — F419 Anxiety disorder, unspecified: Secondary | ICD-10-CM | POA: Diagnosis not present

## 2021-01-17 DIAGNOSIS — F32A Depression, unspecified: Secondary | ICD-10-CM | POA: Diagnosis not present

## 2021-01-17 DIAGNOSIS — R000 Tachycardia, unspecified: Secondary | ICD-10-CM | POA: Diagnosis not present

## 2021-02-03 DIAGNOSIS — Z6826 Body mass index (BMI) 26.0-26.9, adult: Secondary | ICD-10-CM | POA: Diagnosis not present

## 2021-02-03 DIAGNOSIS — F419 Anxiety disorder, unspecified: Secondary | ICD-10-CM | POA: Diagnosis not present

## 2021-02-03 DIAGNOSIS — R002 Palpitations: Secondary | ICD-10-CM | POA: Diagnosis not present

## 2021-02-03 DIAGNOSIS — F32A Depression, unspecified: Secondary | ICD-10-CM | POA: Diagnosis not present

## 2021-03-06 DIAGNOSIS — E538 Deficiency of other specified B group vitamins: Secondary | ICD-10-CM | POA: Diagnosis not present

## 2021-03-21 DIAGNOSIS — Z1231 Encounter for screening mammogram for malignant neoplasm of breast: Secondary | ICD-10-CM | POA: Diagnosis not present

## 2021-03-29 DIAGNOSIS — Z6827 Body mass index (BMI) 27.0-27.9, adult: Secondary | ICD-10-CM | POA: Diagnosis not present

## 2021-03-29 DIAGNOSIS — S20362A Insect bite (nonvenomous) of left front wall of thorax, initial encounter: Secondary | ICD-10-CM | POA: Diagnosis not present

## 2021-04-05 DIAGNOSIS — E538 Deficiency of other specified B group vitamins: Secondary | ICD-10-CM | POA: Diagnosis not present

## 2021-04-05 DIAGNOSIS — Z6829 Body mass index (BMI) 29.0-29.9, adult: Secondary | ICD-10-CM | POA: Diagnosis not present

## 2021-04-05 DIAGNOSIS — F32A Depression, unspecified: Secondary | ICD-10-CM | POA: Diagnosis not present

## 2021-04-05 DIAGNOSIS — R002 Palpitations: Secondary | ICD-10-CM | POA: Diagnosis not present

## 2021-04-05 DIAGNOSIS — F419 Anxiety disorder, unspecified: Secondary | ICD-10-CM | POA: Diagnosis not present

## 2021-05-01 DIAGNOSIS — R5383 Other fatigue: Secondary | ICD-10-CM | POA: Diagnosis not present

## 2021-05-01 DIAGNOSIS — E039 Hypothyroidism, unspecified: Secondary | ICD-10-CM | POA: Diagnosis not present

## 2021-05-01 DIAGNOSIS — E539 Vitamin B deficiency, unspecified: Secondary | ICD-10-CM | POA: Diagnosis not present

## 2021-05-01 DIAGNOSIS — E538 Deficiency of other specified B group vitamins: Secondary | ICD-10-CM | POA: Diagnosis not present

## 2021-05-01 LAB — BASIC METABOLIC PANEL
BUN: 6 (ref 4–21)
Creatinine: 0.9 (ref 0.5–1.1)

## 2021-05-01 LAB — LIPID PANEL
Cholesterol: 152 (ref 0–200)
HDL: 51 (ref 35–70)
LDL Cholesterol: 82
Triglycerides: 102 (ref 40–160)

## 2021-05-01 LAB — TSH: TSH: 16.8 — AB (ref 0.41–5.90)

## 2021-05-02 ENCOUNTER — Telehealth: Payer: Self-pay | Admitting: "Endocrinology

## 2021-05-02 NOTE — Telephone Encounter (Signed)
Printed results from [place].

## 2021-05-02 NOTE — Telephone Encounter (Signed)
Pt said she got her labs done at Dr. Quintin Alto office but states they require [place] to call and request them. She states number is [phone]

## 2021-05-03 ENCOUNTER — Ambulatory Visit (INDEPENDENT_AMBULATORY_CARE_PROVIDER_SITE_OTHER): Payer: Medicare PPO | Admitting: "Endocrinology

## 2021-05-03 ENCOUNTER — Other Ambulatory Visit: Payer: Self-pay

## 2021-05-03 ENCOUNTER — Encounter: Payer: Self-pay | Admitting: "Endocrinology

## 2021-05-03 VITALS — BP 130/72 | HR 84 | Ht 65.0 in | Wt 168.6 lb

## 2021-05-03 DIAGNOSIS — E0500 Thyrotoxicosis with diffuse goiter without thyrotoxic crisis or storm: Secondary | ICD-10-CM | POA: Diagnosis not present

## 2021-05-03 DIAGNOSIS — E890 Postprocedural hypothyroidism: Secondary | ICD-10-CM

## 2021-05-03 MED ORDER — LEVOTHYROXINE SODIUM 112 MCG PO TABS: 112.0000 ug | ORAL_TABLET | Freq: Every day | ORAL | 1 refills | Status: DC

## 2021-05-03 NOTE — Progress Notes (Signed)
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[facility])    Past Surgical History:  Procedure Laterality Date   BUNIONECTOMY Left 2000   BUNIONECTOMY Right 2005   COLONOSCOPY N/A 2010   COLONOSCOPY N/A 01/28/2017   Procedure: COLONOSCOPY;  Surgeon: Danie Binder, MD;  [facility];  Service: Endoscopy;  Laterality: N/A;  9:30am   ESOPHAGOGASTRODUODENOSCOPY N/A 2010   with dilation   PARTIAL COLECTOMY N/A 1981   terminal ileum resection   Social History   Socioeconomic History   Marital status: Married    Spouse name: Not on file   Number of children: Not on file   Years of education: Not on file   Highest education level: Not on file  Occupational History   Not on file  Tobacco Use   Smoking status: Former    Pack years: 0.00    Types: Cigarettes    Quit date: 01/03/1997    Years since quitting: 24.3   Smokeless tobacco: Never  Vaping Use   Vaping Use: Never used  Substance and Sexual Activity   Alcohol use: No   Drug use: No   Sexual activity: Not on file  Other Topics Concern   Not on file  Social History Narrative   Not on file   Social Determinants of Health   Financial Resource Strain: Not on file  Food Insecurity: Not on file  Transportation Needs: Not on file  Physical Activity: Not on file  Stress: Not on file  Social Connections: Not on file   Outpatient Encounter Medications as of 05/03/2021  Medication Sig   acetaminophen (TYLENOL) 500 MG tablet Take 500-1,000 mg by mouth daily as needed for moderate pain or headache.   aspirin EC 81 MG tablet Take 81 mg by  mouth daily.   azaTHIOprine (IMURAN) 50 MG tablet Take 50 mg by mouth every evening.    buPROPion (WELLBUTRIN SR) 150 MG 12 hr tablet Take 0.5 mg by mouth daily.   Calcium Carb-Cholecalciferol 600-800 MG-UNIT TABS Take 1 tablet by mouth daily.   Chlorpheniramine-Phenylephrine (SINUS & ALLERGY PO) Take by mouth.   citalopram (CELEXA) 20 MG tablet Take 40 mg by mouth daily.   Cyanocobalamin (VITAMIN B-12 IJ) Inject 1 Dose as directed every 30 (thirty) days.    levothyroxine (SYNTHROID) 112 MCG tablet Take 1 tablet (112 mcg total) by mouth daily.   LORazepam (ATIVAN) 0.5 MG tablet Take 0.5 mg by mouth in the morning, at noon, and at bedtime.   metoprolol tartrate (LOPRESSOR) 25 MG tablet Take 25 mg by mouth daily. Take 2 tablets daily   Multiple Vitamin (MULTIVITAMIN) capsule Take 1 capsule by mouth daily.  Naphazoline-Pheniramine (OPCON-A OP) Apply 1 drop to eye daily as needed (allergies).   pantoprazole (PROTONIX) 40 MG tablet Take 1 tablet by mouth daily.   potassium chloride (K-DUR,KLOR-CON) 10 MEQ tablet Take 10 mEq by mouth 2 (two) times daily.   Tetrahydrozoline HCl (VISINE OP) Apply 1 drop to eye daily as needed (dry eyes).   valACYclovir (VALTREX) 500 MG tablet Take 500 mg by mouth 2 (two) times daily. As needed   [DISCONTINUED] levothyroxine (SYNTHROID) 100 MCG tablet Take 1 tablet (100 mcg total) by mouth daily.   [DISCONTINUED] mirtazapine (REMERON) 7.5 MG tablet Take 7.5 mg by mouth at bedtime.   No facility-administered encounter medications on file as of 05/03/2021.   ALLERGIES: Allergies  Allergen Reactions   Meperidine     Funny feeling   Mirtazapine     "Makes me feel out of body like"   Morphine And Related     Funny feeling   Sulfamethoxazole Itching and Rash    VACCINATION STATUS: Immunization History  Administered Date(s) Administered   Influenza-Unspecified 08/12/2018    HPI Isabella Scott is 65 y.o. female who is status post  RAI thyroid ablation for  Graves' disease in May 2019.  -She was subsequently initiated on thyroid hormone replacement.  She received levothyroxine as high as 137 mcg in the past.  However due to unexplained anxiety which she thought was due to her thyroid hormone her dose was lowered for safety.  She is currently on 100 mcg of Synthroid before breakfast.  She presents with repeat thyroid function test before visit.  Her labs are consistent with inadequate replacement.   She has consistent weight, however still dealing with anxiety.  She denies palpitations, tremors, nor heat intolerance.     She reports compliance to her medications, does not skip any doses.    -She denies dysphagia, shortness of breath, change in her voice.  She denies cold/hot intolerance.  She denies any family history of thyroid cancer.    Review of Systems -Limited as above.    Objective:    BP 130/72   Pulse 84   Ht 5' 5"  (1.651 m)   Wt 168 lb 9.6 oz (76.5 kg)   BMI 28.06 kg/m   Wt Readings from Last 3 Encounters:  05/03/21 168 lb 9.6 oz (76.5 kg)  11/23/20 169 lb (76.7 kg)  11/02/20 165 lb 3.2 oz (74.9 kg)    Physical Exam  Patient is anxious, tearful. CMP     Component Value Date/Time   NA 141 02/26/2020 0805   K 4.1 02/26/2020 0805   CL 107 02/26/2020 0805   CO2 25 02/26/2020 0805   GLUCOSE 80 02/26/2020 0805   BUN 6 05/01/2021 0000   CREATININE 0.9 05/01/2021 0000   CREATININE 0.87 02/26/2020 0805   CALCIUM 8.9 02/26/2020 0805   PROT 6.6 02/26/2020 0805   ALBUMIN 4.0 01/16/2017 1302   AST 13 02/26/2020 0805   ALT 7 02/26/2020 0805   ALKPHOS 82 01/16/2017 1302   BILITOT 0.2 02/26/2020 0805   GFRNONAA 71 02/26/2020 0805   GFRAA 83 02/26/2020 0805   Recent Results (from the past 2160 hour(s))  Basic metabolic panel     Status: None   Collection Time: 05/01/21 12:00 AM  Result Value Ref Range   BUN 6 4 - 21   Creatinine 0.9 0.5 - 1.1  Lipid panel     Status: None   Collection Time: 05/01/21 12:00 AM  Result  Value Ref Range  Triglycerides 102 40 - 160   Cholesterol 152 0 - 200   HDL 51 35 - 70   LDL Cholesterol 82   TSH     Status: Abnormal   Collection Time: 05/01/21 12:00 AM  Result Value Ref Range   TSH 16.80 (A) 0.41 - 5.90    Comment: T4,Free 1.28     May 16, 2018 labs showed TSH <0.006, free T4 1.74  Her thyroid ultrasound on August 15, 2017 showed right lobe 4.3 cm and left lobe 4.1 cm.  Scattered bilateral small nodules, largest on the right lobe 0.5 cm.  Repeat thyroid ultrasound from February 28, 2018 is unremarkable, no nodular lesions.  Recent Results (from the past 2160 hour(s))  Basic metabolic panel     Status: None   Collection Time: 05/01/21 12:00 AM  Result Value Ref Range   BUN 6 4 - 21   Creatinine 0.9 0.5 - 1.1  Lipid panel     Status: None   Collection Time: 05/01/21 12:00 AM  Result Value Ref Range   Triglycerides 102 40 - 160   Cholesterol 152 0 - 200   HDL 51 35 - 70   LDL Cholesterol 82   TSH     Status: Abnormal   Collection Time: 05/01/21 12:00 AM  Result Value Ref Range   TSH 16.80 (A) 0.41 - 5.90    Comment: T4,Free 1.28     Assessment & Plan:   1.  RAI induced hypothyroidism  2.  Graves' disease-resolved status post RAI treatment  -Her previsit labs are consistent with inadequate replacement.  This is expected to when her levothyroxine dose was lowered for safety.  She is willing to consider adjustment.  I discussed and increase her thyroxine to 112 mcg p.o. daily before breakfast.    - We discussed about the correct intake of her thyroid hormone, on empty stomach at fasting, with water, separated by at least 30 minutes from breakfast and other medications,  and separated by more than 4 hours from calcium, iron, multivitamins, acid reflux medications (PPIs). -Patient is made aware of the fact that thyroid hormone replacement is needed for life, dose to be adjusted by periodic monitoring of thyroid function tests.    - I advised patient to  maintain close follow up with Sasser, Silvestre Moment, MD for primary care needs.    I spent 25 minutes in the care of the patient today including review of labs from Thyroid Function, CMP, and other relevant labs ; imaging/biopsy records (current and previous including abstractions from other facilities); face-to-face time discussing  her lab results and symptoms, medications doses, her options of short and long term treatment based on the latest standards of care / guidelines;   and documenting the encounter.  Isabella Scott  participated in the discussions, expressed understanding, and voiced agreement with the above plans.  All questions were answered to her satisfaction. she is encouraged to contact clinic should she have any questions or concerns prior to her return visit.   Follow up plan: Return in about 4 months (around 09/02/2021) for F/U with Pre-visit Labs.   Glade Lloyd, MD [facility] [address] [place], [place] [address] Phone: [phone]  Fax: [phone]     05/03/2021, 12:05 PM  This note was partially dictated with voice recognition software. Similar sounding words can be transcribed inadequately or may not  be corrected upon review.

## 2021-05-31 DIAGNOSIS — E538 Deficiency of other specified B group vitamins: Secondary | ICD-10-CM | POA: Diagnosis not present

## 2021-06-21 DIAGNOSIS — E039 Hypothyroidism, unspecified: Secondary | ICD-10-CM | POA: Diagnosis not present

## 2021-06-21 DIAGNOSIS — I10 Essential (primary) hypertension: Secondary | ICD-10-CM | POA: Diagnosis not present

## 2021-06-21 DIAGNOSIS — I499 Cardiac arrhythmia, unspecified: Secondary | ICD-10-CM | POA: Diagnosis not present

## 2021-06-21 DIAGNOSIS — F411 Generalized anxiety disorder: Secondary | ICD-10-CM | POA: Diagnosis not present

## 2021-06-21 DIAGNOSIS — K08109 Complete loss of teeth, unspecified cause, unspecified class: Secondary | ICD-10-CM | POA: Diagnosis not present

## 2021-06-21 DIAGNOSIS — F325 Major depressive disorder, single episode, in full remission: Secondary | ICD-10-CM | POA: Diagnosis not present

## 2021-06-21 DIAGNOSIS — G8929 Other chronic pain: Secondary | ICD-10-CM | POA: Diagnosis not present

## 2021-06-21 DIAGNOSIS — K5090 Crohn's disease, unspecified, without complications: Secondary | ICD-10-CM | POA: Diagnosis not present

## 2021-06-21 DIAGNOSIS — K219 Gastro-esophageal reflux disease without esophagitis: Secondary | ICD-10-CM | POA: Diagnosis not present

## 2021-07-06 DIAGNOSIS — F32A Depression, unspecified: Secondary | ICD-10-CM | POA: Diagnosis not present

## 2021-07-06 DIAGNOSIS — K5090 Crohn's disease, unspecified, without complications: Secondary | ICD-10-CM | POA: Diagnosis not present

## 2021-07-06 DIAGNOSIS — Z0001 Encounter for general adult medical examination with abnormal findings: Secondary | ICD-10-CM | POA: Diagnosis not present

## 2021-07-06 DIAGNOSIS — F419 Anxiety disorder, unspecified: Secondary | ICD-10-CM | POA: Diagnosis not present

## 2021-07-06 DIAGNOSIS — R002 Palpitations: Secondary | ICD-10-CM | POA: Diagnosis not present

## 2021-07-06 DIAGNOSIS — E538 Deficiency of other specified B group vitamins: Secondary | ICD-10-CM | POA: Diagnosis not present

## 2021-07-06 DIAGNOSIS — E039 Hypothyroidism, unspecified: Secondary | ICD-10-CM | POA: Diagnosis not present

## 2021-07-06 DIAGNOSIS — F410 Panic disorder [episodic paroxysmal anxiety] without agoraphobia: Secondary | ICD-10-CM | POA: Diagnosis not present

## 2021-07-22 DIAGNOSIS — Z882 Allergy status to sulfonamides status: Secondary | ICD-10-CM | POA: Diagnosis not present

## 2021-07-22 DIAGNOSIS — R0602 Shortness of breath: Secondary | ICD-10-CM | POA: Diagnosis not present

## 2021-07-22 DIAGNOSIS — K5090 Crohn's disease, unspecified, without complications: Secondary | ICD-10-CM | POA: Diagnosis not present

## 2021-07-22 DIAGNOSIS — Z7982 Long term (current) use of aspirin: Secondary | ICD-10-CM | POA: Diagnosis not present

## 2021-07-22 DIAGNOSIS — Z79890 Hormone replacement therapy (postmenopausal): Secondary | ICD-10-CM | POA: Diagnosis not present

## 2021-07-22 DIAGNOSIS — R002 Palpitations: Secondary | ICD-10-CM | POA: Diagnosis not present

## 2021-07-22 DIAGNOSIS — Z885 Allergy status to narcotic agent status: Secondary | ICD-10-CM | POA: Diagnosis not present

## 2021-07-22 DIAGNOSIS — R0600 Dyspnea, unspecified: Secondary | ICD-10-CM | POA: Diagnosis not present

## 2021-07-22 DIAGNOSIS — E079 Disorder of thyroid, unspecified: Secondary | ICD-10-CM | POA: Diagnosis not present

## 2021-07-22 DIAGNOSIS — R911 Solitary pulmonary nodule: Secondary | ICD-10-CM | POA: Diagnosis not present

## 2021-07-22 DIAGNOSIS — F419 Anxiety disorder, unspecified: Secondary | ICD-10-CM | POA: Diagnosis not present

## 2021-07-25 DIAGNOSIS — Z6825 Body mass index (BMI) 25.0-25.9, adult: Secondary | ICD-10-CM | POA: Diagnosis not present

## 2021-07-25 DIAGNOSIS — R634 Abnormal weight loss: Secondary | ICD-10-CM | POA: Diagnosis not present

## 2021-07-25 DIAGNOSIS — F419 Anxiety disorder, unspecified: Secondary | ICD-10-CM | POA: Diagnosis not present

## 2021-07-25 DIAGNOSIS — R002 Palpitations: Secondary | ICD-10-CM | POA: Diagnosis not present

## 2021-07-25 DIAGNOSIS — F32A Depression, unspecified: Secondary | ICD-10-CM | POA: Diagnosis not present

## 2021-07-31 ENCOUNTER — Telehealth: Payer: Self-pay

## 2021-07-31 NOTE — Telephone Encounter (Signed)
Called patient and made her aware.

## 2021-07-31 NOTE — Telephone Encounter (Signed)
Pt said she is loosing weight and she went to the ER and they did her thyroid labs. She is asking to see you but I told her I would send you a message to see what you said. Please Advise.

## 2021-08-07 DIAGNOSIS — E538 Deficiency of other specified B group vitamins: Secondary | ICD-10-CM | POA: Diagnosis not present

## 2021-08-30 DIAGNOSIS — E539 Vitamin B deficiency, unspecified: Secondary | ICD-10-CM | POA: Diagnosis not present

## 2021-08-30 DIAGNOSIS — E039 Hypothyroidism, unspecified: Secondary | ICD-10-CM | POA: Diagnosis not present

## 2021-08-31 LAB — TSH: TSH: 5.97 — AB (ref 0.41–5.90)

## 2021-09-05 ENCOUNTER — Ambulatory Visit (INDEPENDENT_AMBULATORY_CARE_PROVIDER_SITE_OTHER): Payer: Medicare PPO | Admitting: "Endocrinology

## 2021-09-05 ENCOUNTER — Encounter: Payer: Self-pay | Admitting: "Endocrinology

## 2021-09-05 ENCOUNTER — Other Ambulatory Visit: Payer: Self-pay

## 2021-09-05 ENCOUNTER — Ambulatory Visit: Payer: Medicare PPO | Admitting: "Endocrinology

## 2021-09-05 VITALS — BP 120/74 | HR 92 | Ht 65.0 in | Wt 154.0 lb

## 2021-09-05 DIAGNOSIS — E890 Postprocedural hypothyroidism: Secondary | ICD-10-CM | POA: Diagnosis not present

## 2021-09-05 MED ORDER — LEVOTHYROXINE SODIUM 112 MCG PO TABS: 112.0000 ug | ORAL_TABLET | Freq: Every day | ORAL | 1 refills | Status: DC

## 2021-09-05 NOTE — Progress Notes (Signed)
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[facility])    Past Surgical History:  Procedure Laterality Date   BUNIONECTOMY Left 2000   BUNIONECTOMY Right 2005   COLONOSCOPY N/A 2010   COLONOSCOPY N/A 01/28/2017   Procedure: COLONOSCOPY;  Surgeon: Danie Binder, MD;  [facility];  Service: Endoscopy;  Laterality: N/A;  9:30am   ESOPHAGOGASTRODUODENOSCOPY N/A 2010   with dilation   PARTIAL COLECTOMY N/A 1981   terminal ileum resection   Social History   Socioeconomic History   Marital status: Married    Spouse name: Not on file   Number of children: Not on file   Years of education: Not on file   Highest education level: Not on file  Occupational History   Not on file  Tobacco Use   Smoking status: Former    Types: Cigarettes    Quit date: 01/03/1997    Years since quitting: 24.6   Smokeless tobacco: Never  Vaping Use   Vaping Use: Never used  Substance and Sexual Activity   Alcohol use: No   Drug use: No   Sexual activity: Not on file  Other Topics Concern   Not on file  Social History Narrative   Not on file   Social Determinants of Health   Financial Resource Strain: Not on file  Food Insecurity: Not on file  Transportation Needs: Not on file  Physical Activity: Not on file  Stress: Not on file  Social Connections: Not on file   Outpatient Encounter Medications as of 09/05/2021  Medication Sig   acetaminophen (TYLENOL) 500 MG tablet Take 500-1,000 mg by mouth daily as needed for moderate pain or headache.   aspirin EC 81 MG tablet Take 81 mg by mouth daily.    azaTHIOprine (IMURAN) 50 MG tablet Take 50 mg by mouth every evening.    buPROPion (WELLBUTRIN SR) 150 MG 12 hr tablet Take 0.5 mg by mouth daily.   Calcium Carb-Cholecalciferol 600-800 MG-UNIT TABS Take 1 tablet by mouth daily.   Chlorpheniramine-Phenylephrine (SINUS & ALLERGY PO) Take by mouth.   citalopram (CELEXA) 20 MG tablet Take 40 mg by mouth daily.   Cyanocobalamin (VITAMIN B-12 IJ) Inject 1 Dose as directed every 30 (thirty) days.    levothyroxine (SYNTHROID) 112 MCG tablet Take 1 tablet (112 mcg total) by mouth daily.   LORazepam (ATIVAN) 0.5 MG tablet Take 0.5 mg by mouth in the morning, at noon, and at bedtime.   metoprolol tartrate (LOPRESSOR) 25 MG tablet Take 25 mg by mouth daily. Take 2 tablets daily   Multiple Vitamin (MULTIVITAMIN) capsule Take 1 capsule by mouth daily.   Naphazoline-Pheniramine (OPCON-A OP) Apply  1 drop to eye daily as needed (allergies).   pantoprazole (PROTONIX) 40 MG tablet Take 1 tablet by mouth daily.   potassium chloride (K-DUR,KLOR-CON) 10 MEQ tablet Take 10 mEq by mouth 2 (two) times daily.   Tetrahydrozoline HCl (VISINE OP) Apply 1 drop to eye daily as needed (dry eyes).   valACYclovir (VALTREX) 500 MG tablet Take 500 mg by mouth 2 (two) times daily. As needed   [DISCONTINUED] levothyroxine (SYNTHROID) 112 MCG tablet Take 1 tablet (112 mcg total) by mouth daily.   No facility-administered encounter medications on file as of 09/05/2021.   ALLERGIES: Allergies  Allergen Reactions   Meperidine     Funny feeling   Mirtazapine     "Makes me feel out of body like"   Morphine And Related     Funny feeling   Sulfamethoxazole Itching and Rash    VACCINATION STATUS: Immunization History  Administered Date(s) Administered   Influenza-Unspecified 08/12/2018    HPI Isabella Scott is 65 y.o. female who is status post  RAI thyroid ablation for Graves' disease in May 2019.  -She was subsequently initiated on thyroid hormone replacement.  She  received levothyroxine as high as 137 mcg in the past.  However due to unexplained anxiety which she thought was due to her thyroid hormone her dose was lowered for safety.  She is currently on 112 mcg of Synthroid before breakfast.  She reports compliance and consistency taking her medications this time.  Her labs are consistent with slight under replacement, however patient presents with unintended weight loss of up to 8 pounds  She still has general anxiety syndrome.  She denies palpitations, tremors, nor heat intolerance.   -She denies dysphagia, shortness of breath, change in her voice.  She denies cold/hot intolerance.  She denies any family history of thyroid cancer.    Review of Systems -Limited as above.    Objective:    BP 120/74   Pulse 92   Ht 5' 5"  (1.651 m)   Wt 154 lb (69.9 kg)   BMI 25.63 kg/m   Wt Readings from Last 3 Encounters:  09/05/21 154 lb (69.9 kg)  05/03/21 168 lb 9.6 oz (76.5 kg)  11/23/20 169 lb (76.7 kg)    Physical Exam  Patient is anxious, tearful. CMP     Component Value Date/Time   NA 141 02/26/2020 0805   K 4.1 02/26/2020 0805   CL 107 02/26/2020 0805   CO2 25 02/26/2020 0805   GLUCOSE 80 02/26/2020 0805   BUN 6 05/01/2021 0000   CREATININE 0.9 05/01/2021 0000   CREATININE 0.87 02/26/2020 0805   CALCIUM 8.9 02/26/2020 0805   PROT 6.6 02/26/2020 0805   ALBUMIN 4.0 01/16/2017 1302   AST 13 02/26/2020 0805   ALT 7 02/26/2020 0805   ALKPHOS 82 01/16/2017 1302   BILITOT 0.2 02/26/2020 0805   GFRNONAA 71 02/26/2020 0805   GFRAA 83 02/26/2020 0805   Recent Results (from the past 2160 hour(s))  TSH     Status: Abnormal   Collection Time: 08/30/21 12:00 AM  Result Value Ref Range   TSH 5.97 (A) 0.41 - 5.90    Comment: Free T4 1.35     May 16, 2018 labs showed TSH <0.006, free T4 1.74  Her thyroid ultrasound on August 15, 2017 showed right lobe 4.3 cm and left lobe 4.1 cm.  Scattered bilateral small nodules, largest on the right lobe  0.5 cm.  Repeat thyroid ultrasound from February 28, 2018  is unremarkable, no nodular lesions.  Recent Results (from the past 2160 hour(s))  TSH     Status: Abnormal   Collection Time: 08/30/21 12:00 AM  Result Value Ref Range   TSH 5.97 (A) 0.41 - 5.90    Comment: Free T4 1.35     Assessment & Plan:   1.  RAI induced hypothyroidism  2.  Graves' disease-resolved status post RAI treatment  -Her previsit labs are consistent with slight under replacement, however in the interest of her tendency for general anxiety, and unintended weight loss since last visit, I discussed and kept her dose the same at 112 mcg of levothyroxine daily before breakfast.    - We discussed about the correct intake of her thyroid hormone, on empty stomach at fasting, with water, separated by at least 30 minutes from breakfast and other medications,  and separated by more than 4 hours from calcium, iron, multivitamins, acid reflux medications (PPIs). -Patient is made aware of the fact that thyroid hormone replacement is needed for life, dose to be adjusted by periodic monitoring of thyroid function tests.    - I advised patient to maintain close follow up with Sasser, Silvestre Moment, MD for primary care needs.    I spent 21 minutes in the care of the patient today including review of labs from Thyroid Function, CMP, and other relevant labs ; imaging/biopsy records (current and previous including abstractions from other facilities); face-to-face time discussing  her lab results and symptoms, medications doses, her options of short and long term treatment based on the latest standards of care / guidelines;   and documenting the encounter.  Isabella Scott  participated in the discussions, expressed understanding, and voiced agreement with the above plans.  All questions were answered to her satisfaction. she is encouraged to contact clinic should she have any questions or concerns prior to her return visit.    Follow up  plan: Return in about 6 months (around 03/06/2022) for F/U with Pre-visit Labs.   Glade Lloyd, MD [facility] [address] [place], [place] [address] Phone: [phone]  Fax: [phone]     09/05/2021, 8:54 AM  This note was partially dictated with voice recognition software. Similar sounding words can be transcribed inadequately or may not  be corrected upon review.

## 2021-09-18 DIAGNOSIS — F419 Anxiety disorder, unspecified: Secondary | ICD-10-CM | POA: Diagnosis not present

## 2021-09-18 DIAGNOSIS — R634 Abnormal weight loss: Secondary | ICD-10-CM | POA: Diagnosis not present

## 2021-09-18 DIAGNOSIS — K5090 Crohn's disease, unspecified, without complications: Secondary | ICD-10-CM | POA: Diagnosis not present

## 2021-09-18 DIAGNOSIS — Z6824 Body mass index (BMI) 24.0-24.9, adult: Secondary | ICD-10-CM | POA: Diagnosis not present

## 2021-09-18 DIAGNOSIS — E559 Vitamin D deficiency, unspecified: Secondary | ICD-10-CM | POA: Diagnosis not present

## 2021-09-18 DIAGNOSIS — R5383 Other fatigue: Secondary | ICD-10-CM | POA: Diagnosis not present

## 2021-09-18 DIAGNOSIS — F410 Panic disorder [episodic paroxysmal anxiety] without agoraphobia: Secondary | ICD-10-CM | POA: Diagnosis not present

## 2021-10-02 DIAGNOSIS — E538 Deficiency of other specified B group vitamins: Secondary | ICD-10-CM | POA: Diagnosis not present

## 2021-10-30 DIAGNOSIS — E538 Deficiency of other specified B group vitamins: Secondary | ICD-10-CM | POA: Diagnosis not present

## 2021-10-30 DIAGNOSIS — Z23 Encounter for immunization: Secondary | ICD-10-CM | POA: Diagnosis not present

## 2021-11-17 DIAGNOSIS — G8929 Other chronic pain: Secondary | ICD-10-CM | POA: Diagnosis not present

## 2021-11-17 DIAGNOSIS — E039 Hypothyroidism, unspecified: Secondary | ICD-10-CM | POA: Diagnosis not present

## 2021-11-17 DIAGNOSIS — F411 Generalized anxiety disorder: Secondary | ICD-10-CM | POA: Diagnosis not present

## 2021-11-17 DIAGNOSIS — K219 Gastro-esophageal reflux disease without esophagitis: Secondary | ICD-10-CM | POA: Diagnosis not present

## 2021-11-17 DIAGNOSIS — I739 Peripheral vascular disease, unspecified: Secondary | ICD-10-CM | POA: Diagnosis not present

## 2021-11-17 DIAGNOSIS — I471 Supraventricular tachycardia: Secondary | ICD-10-CM | POA: Diagnosis not present

## 2021-11-17 DIAGNOSIS — K5190 Ulcerative colitis, unspecified, without complications: Secondary | ICD-10-CM | POA: Diagnosis not present

## 2021-11-17 DIAGNOSIS — B009 Herpesviral infection, unspecified: Secondary | ICD-10-CM | POA: Diagnosis not present

## 2021-11-17 DIAGNOSIS — F3341 Major depressive disorder, recurrent, in partial remission: Secondary | ICD-10-CM | POA: Diagnosis not present

## 2021-11-29 DIAGNOSIS — F419 Anxiety disorder, unspecified: Secondary | ICD-10-CM | POA: Diagnosis not present

## 2021-11-29 DIAGNOSIS — E538 Deficiency of other specified B group vitamins: Secondary | ICD-10-CM | POA: Diagnosis not present

## 2021-11-29 DIAGNOSIS — F410 Panic disorder [episodic paroxysmal anxiety] without agoraphobia: Secondary | ICD-10-CM | POA: Diagnosis not present

## 2021-11-29 DIAGNOSIS — K5090 Crohn's disease, unspecified, without complications: Secondary | ICD-10-CM | POA: Diagnosis not present

## 2021-11-29 DIAGNOSIS — F32A Depression, unspecified: Secondary | ICD-10-CM | POA: Diagnosis not present

## 2021-11-29 DIAGNOSIS — R002 Palpitations: Secondary | ICD-10-CM | POA: Diagnosis not present

## 2021-11-29 DIAGNOSIS — E039 Hypothyroidism, unspecified: Secondary | ICD-10-CM | POA: Diagnosis not present

## 2021-12-07 DIAGNOSIS — F410 Panic disorder [episodic paroxysmal anxiety] without agoraphobia: Secondary | ICD-10-CM | POA: Diagnosis not present

## 2021-12-07 DIAGNOSIS — K5090 Crohn's disease, unspecified, without complications: Secondary | ICD-10-CM | POA: Diagnosis not present

## 2021-12-07 DIAGNOSIS — F331 Major depressive disorder, recurrent, moderate: Secondary | ICD-10-CM | POA: Diagnosis not present

## 2021-12-07 DIAGNOSIS — F419 Anxiety disorder, unspecified: Secondary | ICD-10-CM | POA: Diagnosis not present

## 2021-12-07 DIAGNOSIS — Z6824 Body mass index (BMI) 24.0-24.9, adult: Secondary | ICD-10-CM | POA: Diagnosis not present

## 2022-01-01 DIAGNOSIS — E538 Deficiency of other specified B group vitamins: Secondary | ICD-10-CM | POA: Diagnosis not present

## 2022-01-10 DIAGNOSIS — K5090 Crohn's disease, unspecified, without complications: Secondary | ICD-10-CM | POA: Diagnosis not present

## 2022-01-10 DIAGNOSIS — Z6824 Body mass index (BMI) 24.0-24.9, adult: Secondary | ICD-10-CM | POA: Diagnosis not present

## 2022-01-18 ENCOUNTER — Other Ambulatory Visit: Payer: Self-pay

## 2022-01-18 ENCOUNTER — Emergency Department (HOSPITAL_COMMUNITY): Payer: Medicare PPO

## 2022-01-18 ENCOUNTER — Inpatient Hospital Stay (HOSPITAL_COMMUNITY)
Admission: EM | Admit: 2022-01-18 | Discharge: 2022-01-20 | DRG: 387 | Disposition: A | Discharge status: Home / Self Care | Payer: Medicare PPO | Attending: Family Medicine | Admitting: Family Medicine

## 2022-01-18 ENCOUNTER — Encounter (HOSPITAL_COMMUNITY): Payer: Self-pay | Admitting: *Deleted

## 2022-01-18 DIAGNOSIS — Z79890 Hormone replacement therapy (postmenopausal): Secondary | ICD-10-CM | POA: Diagnosis not present

## 2022-01-18 DIAGNOSIS — Z888 Allergy status to other drugs, medicaments and biological substances status: Secondary | ICD-10-CM | POA: Diagnosis not present

## 2022-01-18 DIAGNOSIS — Z79899 Other long term (current) drug therapy: Secondary | ICD-10-CM | POA: Diagnosis not present

## 2022-01-18 DIAGNOSIS — Z8249 Family history of ischemic heart disease and other diseases of the circulatory system: Secondary | ICD-10-CM

## 2022-01-18 DIAGNOSIS — K56609 Unspecified intestinal obstruction, unspecified as to partial versus complete obstruction: Secondary | ICD-10-CM | POA: Diagnosis not present

## 2022-01-18 DIAGNOSIS — K8020 Calculus of gallbladder without cholecystitis without obstruction: Secondary | ICD-10-CM | POA: Diagnosis not present

## 2022-01-18 DIAGNOSIS — K219 Gastro-esophageal reflux disease without esophagitis: Secondary | ICD-10-CM | POA: Diagnosis present

## 2022-01-18 DIAGNOSIS — Z20822 Contact with and (suspected) exposure to covid-19: Secondary | ICD-10-CM | POA: Diagnosis not present

## 2022-01-18 DIAGNOSIS — K5010 Crohn's disease of large intestine without complications: Secondary | ICD-10-CM | POA: Diagnosis present

## 2022-01-18 DIAGNOSIS — I10 Essential (primary) hypertension: Secondary | ICD-10-CM | POA: Diagnosis not present

## 2022-01-18 DIAGNOSIS — K50118 Crohn's disease of large intestine with other complication: Secondary | ICD-10-CM | POA: Diagnosis not present

## 2022-01-18 DIAGNOSIS — Z885 Allergy status to narcotic agent status: Secondary | ICD-10-CM

## 2022-01-18 DIAGNOSIS — Z882 Allergy status to sulfonamides status: Secondary | ICD-10-CM

## 2022-01-18 DIAGNOSIS — K50812 Crohn's disease of both small and large intestine with intestinal obstruction: Principal | ICD-10-CM | POA: Diagnosis present

## 2022-01-18 DIAGNOSIS — R1032 Left lower quadrant pain: Secondary | ICD-10-CM | POA: Diagnosis not present

## 2022-01-18 DIAGNOSIS — K5090 Crohn's disease, unspecified, without complications: Secondary | ICD-10-CM | POA: Diagnosis not present

## 2022-01-18 DIAGNOSIS — E0500 Thyrotoxicosis with diffuse goiter without thyrotoxic crisis or storm: Secondary | ICD-10-CM

## 2022-01-18 DIAGNOSIS — E890 Postprocedural hypothyroidism: Secondary | ICD-10-CM | POA: Diagnosis not present

## 2022-01-18 DIAGNOSIS — Z79624 Long term (current) use of inhibitors of nucleotide synthesis: Secondary | ICD-10-CM | POA: Diagnosis not present

## 2022-01-18 DIAGNOSIS — K50012 Crohn's disease of small intestine with intestinal obstruction: Principal | ICD-10-CM

## 2022-01-18 DIAGNOSIS — Z87891 Personal history of nicotine dependence: Secondary | ICD-10-CM | POA: Diagnosis not present

## 2022-01-18 LAB — CBC WITH DIFFERENTIAL/PLATELET
Abs Immature Granulocytes: 0.24 10*3/uL — ABNORMAL HIGH (ref 0.00–0.07)
Basophils Absolute: 0 10*3/uL (ref 0.0–0.1)
Basophils Relative: 0 %
Eosinophils Absolute: 0 10*3/uL (ref 0.0–0.5)
Eosinophils Relative: 0 %
HCT: 41.2 % (ref 36.0–46.0)
Hemoglobin: 13.2 g/dL (ref 12.0–15.0)
Immature Granulocytes: 2 %
Lymphocytes Relative: 12 %
Lymphs Abs: 1.4 10*3/uL (ref 0.7–4.0)
MCH: 30.1 pg (ref 26.0–34.0)
MCHC: 32 g/dL (ref 30.0–36.0)
MCV: 94.1 fL (ref 80.0–100.0)
Monocytes Absolute: 0.2 10*3/uL (ref 0.1–1.0)
Monocytes Relative: 2 %
Neutro Abs: 9.4 10*3/uL — ABNORMAL HIGH (ref 1.7–7.7)
Neutrophils Relative %: 84 %
Platelets: 324 10*3/uL (ref 150–400)
RBC: 4.38 MIL/uL (ref 3.87–5.11)
RDW: 15.9 % — ABNORMAL HIGH (ref 11.5–15.5)
WBC: 11.3 10*3/uL — ABNORMAL HIGH (ref 4.0–10.5)
nRBC: 0 % (ref 0.0–0.2)

## 2022-01-18 LAB — LIPASE, BLOOD: Lipase: 27 U/L (ref 11–51)

## 2022-01-18 LAB — URINALYSIS, ROUTINE W REFLEX MICROSCOPIC
Bacteria, UA: NONE SEEN
Bilirubin Urine: NEGATIVE
Glucose, UA: NEGATIVE mg/dL
Hgb urine dipstick: NEGATIVE
Ketones, ur: NEGATIVE mg/dL
Nitrite: NEGATIVE
Protein, ur: NEGATIVE mg/dL
Specific Gravity, Urine: 1.006 (ref 1.005–1.030)
pH: 8 (ref 5.0–8.0)

## 2022-01-18 LAB — COMPREHENSIVE METABOLIC PANEL
ALT: 13 U/L (ref 0–44)
AST: 19 U/L (ref 15–41)
Albumin: 3.3 g/dL — ABNORMAL LOW (ref 3.5–5.0)
Alkaline Phosphatase: 67 U/L (ref 38–126)
Anion gap: 8 (ref 5–15)
BUN: 9 mg/dL (ref 8–23)
CO2: 28 mmol/L (ref 22–32)
Calcium: 8.7 mg/dL — ABNORMAL LOW (ref 8.9–10.3)
Chloride: 99 mmol/L (ref 98–111)
Creatinine, Ser: 0.89 mg/dL (ref 0.44–1.00)
GFR, Estimated: >60 mL/min (ref >60)
Glucose, Bld: 110 mg/dL — ABNORMAL HIGH (ref 70–99)
Potassium: 4.4 mmol/L (ref 3.5–5.1)
Sodium: 135 mmol/L (ref 135–145)
Total Bilirubin: 0.5 mg/dL (ref 0.3–1.2)
Total Protein: 7.3 g/dL (ref 6.5–8.1)

## 2022-01-18 LAB — RESP PANEL BY RT-PCR (FLU A&B, COVID) ARPGX2
Influenza A by PCR: NEGATIVE
Influenza B by PCR: NEGATIVE
SARS Coronavirus 2 by RT PCR: NEGATIVE

## 2022-01-18 LAB — SEDIMENTATION RATE: Sed Rate: 50 mm/hr — ABNORMAL HIGH (ref 0–22)

## 2022-01-18 MED ORDER — IOHEXOL 300 MG/ML  SOLN
100.0000 mL | Freq: Once | INTRAMUSCULAR | Status: AC | PRN
  Administered 2022-01-18: 18:00:00 100 mL via INTRAVENOUS

## 2022-01-18 MED ORDER — DEXTROSE-NACL 5-0.9 % IV SOLN: INTRAVENOUS | Status: AC

## 2022-01-18 MED ORDER — ONDANSETRON HCL 4 MG/2ML IJ SOLN: 4.0000 mg | Freq: Four times a day (QID) | INTRAMUSCULAR | Status: DC | PRN

## 2022-01-18 MED ORDER — DEXAMETHASONE SODIUM PHOSPHATE 10 MG/ML IJ SOLN
10.0000 mg | Freq: Once | INTRAMUSCULAR | Status: AC
  Administered 2022-01-18: 14:00:00 10 mg via INTRAVENOUS
  Filled 2022-01-18: qty 1

## 2022-01-18 MED ORDER — ONDANSETRON HCL 4 MG PO TABS: 4.0000 mg | ORAL_TABLET | Freq: Four times a day (QID) | ORAL | Status: DC | PRN

## 2022-01-18 MED ORDER — ACETAMINOPHEN 325 MG PO TABS: 650.0000 mg | ORAL_TABLET | Freq: Four times a day (QID) | ORAL | Status: DC | PRN

## 2022-01-18 MED ORDER — ACETAMINOPHEN 650 MG RE SUPP
650.0000 mg | Freq: Four times a day (QID) | RECTAL | Status: DC | PRN
Start: 2022-01-18 — End: 2022-01-20

## 2022-01-18 MED ORDER — MORPHINE SULFATE (PF) 2 MG/ML IV SOLN: 2.0000 mg | INTRAVENOUS | Status: DC | PRN

## 2022-01-18 MED ORDER — METHYLPREDNISOLONE SODIUM SUCC 125 MG IJ SOLR: 60.0000 mg | Freq: Two times a day (BID) | INTRAMUSCULAR | Status: DC

## 2022-01-18 MED ORDER — METHYLPREDNISOLONE SODIUM SUCC 125 MG IJ SOLR
120.0000 mg | Freq: Every day | INTRAMUSCULAR | Status: DC
Start: 2022-01-19 — End: 2022-01-19
  Administered 2022-01-19: 120 mg via INTRAVENOUS
  Filled 2022-01-18: qty 2

## 2022-01-18 NOTE — H&P (Signed)
History and Physical    Isabella Scott FVC:[id number] DOB: [birth year] DOA: 01/18/2022  PCP: Manon Hilding, MD   Patient coming from: Home  I have personally briefly reviewed patient's old medical records in [place]  Chief Complaint: Abdominal Pain  HPI: Isabella Scott is a 66 y.o. female with medical history significant for Crohn's colitis, dizziness and subsequent hypothyroidism and radioiodine ablation. Patient presented to the ED with complaints of abdominal pain persistent over the past 6 weeks, she was prescribed a course of prednisone taper by her primary care provider.  Her abdominal pain would re-occur when her prednisone was being tapered.  With a second recurrence of abdominal pain, outpatient provider recommended she follow-up with gastroenterology, did not feel comfortable refilling her prednisone.   Patient's previously followed with Dr. Oneida Alar, she had not established care with a new gastroenterologist, and was scheduled to see Dr. Abbey Chatters next month.  She is on azathioprine and compliant, she had this refilled by her primary care provider.  Has not had any issues related to her Crohn's for the past few years.  She denies vomiting.  She would have loose stools, but she takes cholestyramine and this helps.  Her last bowel movement was this morning.  It was loose as she did not take her cholestyramine.  Denies urinary symptoms .  ED Course: Stable vitals.  WBC 11.3.  UA with small leukocytes.  Abdominal CT suggest active Crohn's, and developing small bowel obstruction.  Decadron 10 mg given. EDP talked to gastroenterologist Dr. Abbey Chatters, recommended admission, IV fluids, steroids, will see in consult in the morning.  N.p.o. except for ice chips.  Review of Systems: As per HPI all other systems reviewed and negative.  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 Her smoking use included cigarettes. She has never used smokeless tobacco. She reports that she does not drink alcohol and does not use drugs.  Allergies  Allergen Reactions   Meperidine     Funny feeling   Mirtazapine     "Makes me feel out of body like"   Morphine And Related     Funny feeling   Sulfamethoxazole Itching and Rash    Family History  Problem Relation Age of Onset   Heart disease Father    Heart disease Brother    Kidney disease Brother    Throat cancer Maternal Uncle    Lung cancer Maternal Grandfather    Colon cancer Neg Hx    Crohn's disease Neg Hx    Ulcerative colitis Neg Hx     Prior to Admission medications   Medication Sig Start Date End Date Taking? Authorizing Provider  acetaminophen (TYLENOL) 500 MG tablet Take 500-1,000 mg by mouth daily as needed for moderate pain or headache.   Yes [provider]  azaTHIOprine (IMURAN) 50 MG tablet Take 100 mg by mouth daily.   Yes [provider]  Calcium Carb-Cholecalciferol 600-800 MG-UNIT TABS Take 1 tablet by mouth daily.   Yes [provider]  Chlorpheniramine-Phenylephrine (SINUS & ALLERGY PO) Take by mouth.   Yes [provider]  Cholecalciferol (VITAMIN D-3) 25 MCG (1000 UT) CAPS Take 1 capsule by mouth daily.   Yes [provider]  cholestyramine light (PREVALITE) 4 GM/DOSE powder Take 4 g by mouth daily. 12/20/21  Yes [provider]  citalopram (CELEXA)  20 MG tablet Take 40 mg by mouth daily. 01/22/17  Yes [provider]  clobetasol ointment (TEMOVATE) 4.09 % Apply 1 application. topically every evening. 01/09/21  Yes [provider]  Cyanocobalamin (VITAMIN B-12 IJ) Inject 1 Dose as directed every 30 (thirty) days.    Yes [provider]  fluticasone (FLONASE) 50 MCG/ACT nasal spray Place 2 sprays into both nostrils daily.   Yes [provider]  levothyroxine (SYNTHROID) 112 MCG tablet Take 1 tablet (112 mcg total) by mouth daily. 09/05/21  Yes Nida, Marella Chimes, MD  LORazepam (ATIVAN) 0.5 MG tablet Take 0.5 mg by mouth in the morning, at noon, and at bedtime.   Yes [provider]  metoprolol tartrate (LOPRESSOR) 25 MG tablet Take 50 mg by mouth 3 (three) times daily. Take 2 tablets daily   Yes [provider]  Multiple Vitamin (MULTIVITAMIN) capsule Take 1 capsule by mouth daily.   Yes [provider]  Naphazoline-Pheniramine (OPCON-A OP) Apply 1 drop to eye daily as needed (allergies).   Yes [provider]  pantoprazole (PROTONIX) 40 MG tablet Take 1 tablet by mouth daily. 07/25/20  Yes [provider]  potassium chloride (KLOR-CON) 10 MEQ tablet Take 10 mEq by mouth 2 (two) times daily. 01/10/22  Yes [provider]  Tetrahydrozoline HCl (VISINE OP) Apply 1 drop to eye daily as needed (dry eyes).   Yes [provider]  valACYclovir (VALTREX) 500 MG tablet Take 500 mg by mouth 2 (two) times daily. As needed   Yes [provider]    Physical Exam: Vitals:   01/18/22 1615 01/18/22 2001 01/18/22 2013 01/18/22 2018  BP: (!) 141/77 (!) 144/83 129/85   Pulse: 76 87 78   Resp:  16 18   Temp:  98.1 F (36.7 C) 98.4 F (36.9 C)   TempSrc:  Oral Oral   SpO2: 97% 96% 98%   Weight:    68.1 kg  Height:        Constitutional: NAD, calm, comfortable Vitals:   01/18/22 1615 01/18/22 2001 01/18/22 2013 01/18/22 2018  BP: (!) 141/77 (!)  144/83 129/85   Pulse: 76 87 78   Resp:  16 18   Temp:  98.1 F (36.7 C) 98.4 F (36.9 C)   TempSrc:  Oral Oral   SpO2: 97% 96% 98%   Weight:    68.1 kg  Height:       Eyes: PERRL, lids and conjunctivae normal ENMT: Mucous membranes are moist.  Neck: normal, supple, no masses, no thyromegaly Respiratory: clear to auscultation bilaterally, no wheezing, no crackles. Normal respiratory effort. No accessory muscle use.  Cardiovascular: Regular rate and rhythm, no murmurs / rubs / gallops. No extremity edema. 2+ pedal pulses. .  Abdomen: no tenderness, no masses palpated. No hepatosplenomegaly. Bowel sounds positive.  Musculoskeletal: no clubbing / cyanosis. No joint deformity upper and lower extremities. Good ROM, no contractures. Normal muscle tone.  Skin: no rashes, lesions, ulcers. No induration Neurologic:  Psychiatric: Normal judgment and insight. Alert and oriented x 3. Normal mood.  No apparent cranial nerve abnormality, moving extremities spontaneously.  Labs on Admission: I have personally reviewed following labs and imaging studies  CBC: Recent Labs  Lab 01/18/22 1332  WBC 11.3*  NEUTROABS 9.4*  HGB 13.2  HCT 41.2  MCV 94.1  PLT 353   Basic Metabolic Panel: Recent Labs  Lab 01/18/22 1332  NA 135  K 4.4  CL 99  CO2 28  GLUCOSE 110*  BUN 9  CREATININE 0.89  CALCIUM 8.7*   GFR: Estimated Creatinine Clearance: 58.7 mL/min (by C-G formula based on SCr of 0.89 mg/dL). Liver Function Tests: Recent Labs  Lab 01/18/22 1332  AST 19  ALT 13  ALKPHOS 67  BILITOT 0.5  PROT 7.3  ALBUMIN 3.3*   Recent Labs  Lab 01/18/22 1332  LIPASE 27   Urine analysis:    Component Value Date/Time   COLORURINE YELLOW 01/18/2022 1332   APPEARANCEUR CLEAR 01/18/2022 1332   LABSPEC 1.006 01/18/2022 1332   PHURINE 8.0 01/18/2022 1332   GLUCOSEU NEGATIVE 01/18/2022 1332   HGBUR NEGATIVE 01/18/2022 1332   BILIRUBINUR NEGATIVE 01/18/2022 1332   KETONESUR NEGATIVE  01/18/2022 1332   PROTEINUR NEGATIVE 01/18/2022 1332   NITRITE NEGATIVE 01/18/2022 1332   LEUKOCYTESUR SMALL (A) 01/18/2022 1332    Radiological Exams on Admission: CT ABDOMEN PELVIS W CONTRAST  Result Date: 01/18/2022 CLINICAL DATA:  Crohn's disease with left lower quadrant pain. EXAM: CT ABDOMEN AND PELVIS WITH CONTRAST TECHNIQUE: Multidetector CT imaging of the abdomen and pelvis was performed using the standard protocol following bolus administration of intravenous contrast. RADIATION DOSE REDUCTION: This exam was performed according to the departmental dose-optimization program which includes automated exposure control, adjustment of the mA and/or kV according to patient size and/or use of iterative reconstruction technique. CONTRAST:  165m OMNIPAQUE IOHEXOL 300 MG/ML  SOLN COMPARISON:  None. FINDINGS: Lower chest: There are calcified granulomas in the right lung base. Hepatobiliary: Gallstones are present. There is no biliary ductal dilatation. The liver is within normal limits. Pancreas: Unremarkable. No pancreatic ductal dilatation or surrounding inflammatory changes. Spleen: Normal in size without focal abnormality. Adrenals/Urinary Tract: Adrenal glands are unremarkable. Kidneys are normal, without renal calculi, focal lesion, or hydronephrosis. Bladder is unremarkable. Stomach/Bowel: The right colon appears absent. There is marked wall thickening, submucosal enhancement and surrounding inflammatory stranding involving the distal ileum in the right abdomen. There is surrounding mesenteric haziness and small lymph nodes. Margins are slightly spiculated. Just proximal to this level there are dilated small bowel loops measuring up to 3.7 cm. Jejunal loops and stomach are nondilated. Colon is nondilated. Appendix is not seen. No pneumatosis or free air. Vascular/Lymphatic: No significant vascular findings are present. No enlarged abdominal or pelvic lymph nodes. Reproductive: The uterus is enlarged  and heterogeneous containing multiple fibroids with calcifications. Largest fibroid measures 4.7 cm. Ovaries are nonenlarged. Other: There is trace free fluid in the pelvis. There is a small fat containing umbilical hernia. Sutures are seen along the anterior abdominal wall. No focal abscess identified. Musculoskeletal: No acute or significant osseous findings. IMPRESSION: 1. Distal ileal wall thickening with some tethering/spiculation along and submucosal enhancement. There is surrounding mild inflammation and small reactive lymph nodes. Findings are concerning for active Crohn's disease and/or scarring. Underlying neoplasm not excluded. There is resultant developing small bowel obstruction. 2. Trace free fluid in the pelvis. 3. Cholelithiasis. 4. Fibroid uterus. Electronically  Signed   By: Ronney Asters M.D.   On: 01/18/2022 17:48    EKG: None  Assessment/Plan Principal Problem:   Crohn's colitis ([place]) Active Problems:   Graves' disease   Hypothyroidism following radioiodine therapy   SBO (small bowel obstruction) ([facility])   HTN (hypertension)    Assessment and Plan: * Crohn's colitis ([place]) Crohn's colitis with active flare, and developing small bowel obstruction as seen on CT abdomen.  WBC 11.3.  She has been on 2 courses of prednisone taper over the past 6 weeks with recurrence of abdominal pain when prednisone was to 20 mg.  Previous gastroenterologist was Dr. Oneida Alar, has yet to establish care with a new one.  She reports compliance with Imuran.  Reports history of bowel resection surgery 2/2 bowel obstruction related to her Crohn's disease. - EDP talked to Dr. Abbey Chatters, recommended steroids, will see in the morning -ESR CRP -Decadron 10 mg given, continue IV solumedrol 60 BID -N.p.o. except for ice chips -IV morphine 2 mg as needed - D5 N/s 75cc/hr X 1 day   HTN (hypertension) Hold the metoprolol. -As needed labetalol while N. p.o.  Hypothyroidism following radioiodine therapy Hold  Synthroid for now while NPO.   DVT prophylaxis: Scds for now pending GI eval. Code Status: Full Family Communication: Friend at bedside Disposition Plan: > 2 days Consults called: GI Admission status: Inpt med surg I certify that at the point of admission it is my clinical judgment that the patient will require inpatient hospital care spanning beyond 2 midnights from the point of admission due to high intensity of service, high risk for further deterioration and high frequency of surveillance required. The following factors support the patient status of inpatient:    Bethena Roys MD [facility]  01/18/2022, 9:16 PM

## 2022-01-18 NOTE — Assessment & Plan Note (Addendum)
Crohn's colitis with active flare, and developing small bowel obstruction as seen on CT abdomen.  WBC 11.3.  She has been on 2 courses of prednisone taper over the past 6 weeks with recurrence of abdominal pain when prednisone was to 20 mg.  Previous gastroenterologist was Dr. Oneida Alar, has yet to establish care with a new one.  She reports compliance with Imuran.  Reports history of bowel resection surgery 2/2 bowel obstruction related to her Crohn's disease. ?- EDP talked to Dr. Abbey Chatters, recommended steroids, will see in the morning ?-ESR CRP ?-Decadron 10 mg given, continue IV solumedrol 60 BID ?-N.p.o. except for ice chips ?-IV morphine 2 mg as needed ?- D5 N/s 75cc/hr X 1 day ? ?

## 2022-01-18 NOTE — Assessment & Plan Note (Signed)
Hold Synthroid for now while NPO. ?

## 2022-01-18 NOTE — Assessment & Plan Note (Signed)
Hold the metoprolol. ?-As needed labetalol while N. p.o. ?

## 2022-01-18 NOTE — ED Notes (Signed)
States her PCP will not write for anymore prednisone and she is concerned GI cannot see her until April. States it is time for her colonoscopy and is concerned about the wait.  ?

## 2022-01-18 NOTE — ED Notes (Signed)
Dr. Quintin Alto (PCP  is treating pt with Crohn's currently, pt states med is running out in 3 days.  Instructed pt to see her GI, not able to see til April. ?Pt states colonoscopy is scheduled for next month. Pt here due to ongoing pain. ?

## 2022-01-18 NOTE — ED Provider Notes (Signed)
[facility] EMERGENCY DEPARTMENT Provider Note   CSN: [id number] Arrival date & time: 01/18/22  1139     History  Chief Complaint  Patient presents with   Abdominal Pain    Isabella Scott is a 65 y.o. female with history significant for Crohn's disease, also hypothyroidism and GERD, surgical history significant for partial colectomy presenting for evaluation of persistent lower abdominal pain along with nausea without emesis, some nonbloody loose stools.  Her symptoms have been present for at least the last month, most recently she was on a 2-week taper 40 mg x 7 days then 20 mg x 7 days.  However she very quickly redeveloped symptoms when she dropped to the 20 mg dose.  She also takes Imuran daily and has been compliant with this medication.  She has lots of lower abdominal cramping, complaints of passing lots of gas, denies blood or mucus in her stools and reports having a decent appetite.  She is concerned about being able to tolerate her symptoms as she is unable to get in with her GI specialist until the third week of April at which time she is scheduled for colonoscopy.  She denies fevers or chills.  The history is provided by the patient.      Home Medications Prior to Admission medications   Medication Sig Start Date End Date Taking? Authorizing Provider  acetaminophen (TYLENOL) 500 MG tablet Take 500-1,000 mg by mouth daily as needed for moderate pain or headache.    [provider]  aspirin EC 81 MG tablet Take 81 mg by mouth daily.    [provider]  azaTHIOprine (IMURAN) 50 MG tablet Take 50 mg by mouth every evening.     [provider]  buPROPion (WELLBUTRIN SR) 150 MG 12 hr tablet Take 0.5 mg by mouth daily. 08/08/19   [provider]  Calcium Carb-Cholecalciferol 600-800 MG-UNIT TABS Take 1 tablet by mouth daily.    [provider]  Chlorpheniramine-Phenylephrine (SINUS & ALLERGY PO) Take by mouth.    [provider]   citalopram (CELEXA) 20 MG tablet Take 40 mg by mouth daily. 01/22/17   [provider]  Cyanocobalamin (VITAMIN B-12 IJ) Inject 1 Dose as directed every 30 (thirty) days.     [provider]  levothyroxine (SYNTHROID) 112 MCG tablet Take 1 tablet (112 mcg total) by mouth daily. 09/05/21   Cassandria Anger, MD  LORazepam (ATIVAN) 0.5 MG tablet Take 0.5 mg by mouth in the morning, at noon, and at bedtime.    [provider]  metoprolol tartrate (LOPRESSOR) 25 MG tablet Take 25 mg by mouth daily. Take 2 tablets daily    [provider]  Multiple Vitamin (MULTIVITAMIN) capsule Take 1 capsule by mouth daily.    [provider]  Naphazoline-Pheniramine (OPCON-A OP) Apply 1 drop to eye daily as needed (allergies).    [provider]  pantoprazole (PROTONIX) 40 MG tablet Take 1 tablet by mouth daily. 07/25/20   [provider]  potassium chloride (K-DUR,KLOR-CON) 10 MEQ tablet Take 10 mEq by mouth 2 (two) times daily.    [provider]  Tetrahydrozoline HCl (VISINE OP) Apply 1 drop to eye daily as needed (dry eyes).    [provider]  valACYclovir (VALTREX) 500 MG tablet Take 500 mg by mouth 2 (two) times daily. As needed    [provider]      Allergies    Meperidine, Mirtazapine, Morphine and related, and Sulfamethoxazole  Review of Systems   Review of Systems  Constitutional:  Negative for fever.  HENT:  Negative for congestion and sore throat.   Eyes: Negative.   Respiratory:  Negative for chest tightness and shortness of breath.   Cardiovascular:  Negative for chest pain.  Gastrointestinal:  Positive for abdominal pain, diarrhea and nausea. Negative for blood in stool and vomiting.  Genitourinary: Negative.   Musculoskeletal:  Negative for arthralgias, joint swelling and neck pain.  Skin: Negative.  Negative for rash and wound.  Neurological:  Negative for dizziness, weakness, light-headedness,  numbness and headaches.  Psychiatric/Behavioral: Negative.    All other systems reviewed and are negative.  Physical Exam Updated Vital Signs BP (!) 141/77 (BP Location: Left Arm)    Pulse 76    Temp 98.2 F (36.8 C) (Oral)    Resp 14    Ht 5' 5.5" (1.664 m)    Wt 68.9 kg    SpO2 97%    BMI 24.91 kg/m  Physical Exam Vitals and nursing note reviewed.  Constitutional:      Appearance: She is well-developed.  HENT:     Head: Normocephalic and atraumatic.  Eyes:     Conjunctiva/sclera: Conjunctivae normal.  Cardiovascular:     Rate and Rhythm: Normal rate and regular rhythm.     Heart sounds: Normal heart sounds.  Pulmonary:     Effort: Pulmonary effort is normal.     Breath sounds: Normal breath sounds. No wheezing.  Abdominal:     General: Bowel sounds are increased. There is no distension.     Palpations: Abdomen is soft.     Tenderness: There is abdominal tenderness in the right lower quadrant, suprapubic area and left lower quadrant. There is no guarding or rebound.  Musculoskeletal:        General: Normal range of motion.     Cervical back: Normal range of motion.  Skin:    General: Skin is warm and dry.  Neurological:     Mental Status: She is alert.    ED Results / Procedures / Treatments   Labs (all labs ordered are listed, but only abnormal results are displayed) Labs Reviewed  CBC WITH DIFFERENTIAL/PLATELET - Abnormal; Notable for the following components:      Result Value   WBC 11.3 (*)    RDW 15.9 (*)    Neutro Abs 9.4 (*)    Abs Immature Granulocytes 0.24 (*)    All other components within normal limits  COMPREHENSIVE METABOLIC PANEL - Abnormal; Notable for the following components:   Glucose, Bld 110 (*)    Calcium 8.7 (*)    Albumin 3.3 (*)    All other components within normal limits  URINALYSIS, ROUTINE W REFLEX MICROSCOPIC - Abnormal; Notable for the following components:   Leukocytes,Ua SMALL (*)    All other components within normal limits   LIPASE, BLOOD    EKG None  Radiology CT ABDOMEN PELVIS W CONTRAST  Result Date: 01/18/2022 CLINICAL DATA:  Crohn's disease with left lower quadrant pain. EXAM: CT ABDOMEN AND PELVIS WITH CONTRAST TECHNIQUE: Multidetector CT imaging of the abdomen and pelvis was performed using the standard protocol following bolus administration of intravenous contrast. RADIATION DOSE REDUCTION: This exam was performed according to the departmental dose-optimization program which includes automated exposure control, adjustment of the mA and/or kV according to patient size and/or use of iterative reconstruction technique. CONTRAST:  185m OMNIPAQUE IOHEXOL 300 MG/ML  SOLN COMPARISON:  None. FINDINGS: Lower chest: There are  calcified granulomas in the right lung base. Hepatobiliary: Gallstones are present. There is no biliary ductal dilatation. The liver is within normal limits. Pancreas: Unremarkable. No pancreatic ductal dilatation or surrounding inflammatory changes. Spleen: Normal in size without focal abnormality. Adrenals/Urinary Tract: Adrenal glands are unremarkable. Kidneys are normal, without renal calculi, focal lesion, or hydronephrosis. Bladder is unremarkable. Stomach/Bowel: The right colon appears absent. There is marked wall thickening, submucosal enhancement and surrounding inflammatory stranding involving the distal ileum in the right abdomen. There is surrounding mesenteric haziness and small lymph nodes. Margins are slightly spiculated. Just proximal to this level there are dilated small bowel loops measuring up to 3.7 cm. Jejunal loops and stomach are nondilated. Colon is nondilated. Appendix is not seen. No pneumatosis or free air. Vascular/Lymphatic: No significant vascular findings are present. No enlarged abdominal or pelvic lymph nodes. Reproductive: The uterus is enlarged and heterogeneous containing multiple fibroids with calcifications. Largest fibroid measures 4.7 cm. Ovaries are nonenlarged.  Other: There is trace free fluid in the pelvis. There is a small fat containing umbilical hernia. Sutures are seen along the anterior abdominal wall. No focal abscess identified. Musculoskeletal: No acute or significant osseous findings. IMPRESSION: 1. Distal ileal wall thickening with some tethering/spiculation along and submucosal enhancement. There is surrounding mild inflammation and small reactive lymph nodes. Findings are concerning for active Crohn's disease and/or scarring. Underlying neoplasm not excluded. There is resultant developing small bowel obstruction. 2. Trace free fluid in the pelvis. 3. Cholelithiasis. 4. Fibroid uterus. Electronically Signed   By: Ronney Asters M.D.   On: 01/18/2022 17:48    Procedures Procedures    Medications Ordered in ED Medications  dexamethasone (DECADRON) injection 10 mg (10 mg Intravenous Given 01/18/22 1344)  iohexol (OMNIPAQUE) 300 MG/ML solution 100 mL (100 mLs Intravenous Contrast Given 01/18/22 1732)    ED Course/ Medical Decision Making/ A&P                           Medical Decision Making Patient with known Crohn's disease presenting with persistent abdominal pain which has not responded to prednisone taper x2 over the past several weeks.  Her pain is typical for her Crohn's flares per patient, predominantly in her bilateral lower abdomen.  No fevers, diarrhea only if she forgets to take her Questran.  No rectal bleeding.  Amount and/or Complexity of Data Reviewed Labs: ordered.    Details: Labs reviewed, she does have a leukocytosis at 11.3, however she has recently been on antibiotics as well so this finding is equivocal.  She does have a low albumin level at 3.3. Radiology: ordered.    Details: CT abdomen pelvis confirming it Crohn's flare.  There is a developing small bowel obstruction. Discussion of management or test interpretation with external provider(s): Patient discussed with Dr. Abbey Chatters of GI, he did recommend a CT scan as it had  been quite sometime since her last imaging.  This was completed, results above.  Call Dr. Abbey Chatters back with these results, he does recommend patient be admitted for IV fluids, continued IV steroids.  He will consult patient in the morning.  Patient's should stay n.p.o. except for ice chips.  She has received Decadron 10 mg here in the ED.  Discussed with Dr. Denton Brick who accepts pt for admission.    Risk Prescription drug management. Decision regarding hospitalization.           Final Clinical Impression(s) / ED Diagnoses Final diagnoses:  Crohn's disease  of small intestine with intestinal obstruction [facility])    Rx / DC Orders ED Discharge Orders     None         Landis Martins 01/18/22 Vilma Prader, MD 01/18/22 1900

## 2022-01-18 NOTE — ED Triage Notes (Signed)
Abdominal pain , history of crohns ?

## 2022-01-19 DIAGNOSIS — K56609 Unspecified intestinal obstruction, unspecified as to partial versus complete obstruction: Secondary | ICD-10-CM

## 2022-01-19 DIAGNOSIS — E890 Postprocedural hypothyroidism: Secondary | ICD-10-CM

## 2022-01-19 LAB — BASIC METABOLIC PANEL
Anion gap: 11 (ref 5–15)
BUN: 10 mg/dL (ref 8–23)
CO2: 23 mmol/L (ref 22–32)
Calcium: 8.4 mg/dL — ABNORMAL LOW (ref 8.9–10.3)
Chloride: 105 mmol/L (ref 98–111)
Creatinine, Ser: 0.89 mg/dL (ref 0.44–1.00)
GFR, Estimated: >60 mL/min (ref >60)
Glucose, Bld: 92 mg/dL (ref 70–99)
Potassium: 4 mmol/L (ref 3.5–5.1)
Sodium: 139 mmol/L (ref 135–145)

## 2022-01-19 LAB — CBC
HCT: 37 % (ref 36.0–46.0)
Hemoglobin: 11.8 g/dL — ABNORMAL LOW (ref 12.0–15.0)
MCH: 29.8 pg (ref 26.0–34.0)
MCHC: 31.9 g/dL (ref 30.0–36.0)
MCV: 93.4 fL (ref 80.0–100.0)
Platelets: 309 10*3/uL (ref 150–400)
RBC: 3.96 MIL/uL (ref 3.87–5.11)
RDW: 16 % — ABNORMAL HIGH (ref 11.5–15.5)
WBC: 14.9 10*3/uL — ABNORMAL HIGH (ref 4.0–10.5)
nRBC: 0 % (ref 0.0–0.2)

## 2022-01-19 LAB — HIV ANTIBODY (ROUTINE TESTING W REFLEX): HIV Screen 4th Generation wRfx: NONREACTIVE

## 2022-01-19 LAB — GLUCOSE, CAPILLARY
Glucose-Capillary: 103 mg/dL — ABNORMAL HIGH (ref 70–99)
Glucose-Capillary: 108 mg/dL — ABNORMAL HIGH (ref 70–99)
Glucose-Capillary: 112 mg/dL — ABNORMAL HIGH (ref 70–99)
Glucose-Capillary: 138 mg/dL — ABNORMAL HIGH (ref 70–99)

## 2022-01-19 LAB — C-REACTIVE PROTEIN: CRP: <0.5 mg/dL (ref <1.0)

## 2022-01-19 MED ORDER — CITALOPRAM HYDROBROMIDE 20 MG PO TABS
40.0000 mg | ORAL_TABLET | Freq: Every day | ORAL | Status: DC
  Administered 2022-01-19 – 2022-01-20 (×2): 40 mg via ORAL
  Filled 2022-01-19 (×2): qty 2

## 2022-01-19 MED ORDER — LEVOTHYROXINE SODIUM 112 MCG PO TABS
112.0000 ug | ORAL_TABLET | Freq: Every day | ORAL | Status: DC
  Administered 2022-01-19 – 2022-01-20 (×2): 112 ug via ORAL
  Filled 2022-01-19 (×2): qty 1

## 2022-01-19 MED ORDER — METHYLPREDNISOLONE SODIUM SUCC 125 MG IJ SOLR
60.0000 mg | Freq: Every day | INTRAMUSCULAR | Status: DC
  Administered 2022-01-20: 60 mg via INTRAVENOUS
  Filled 2022-01-19: qty 2

## 2022-01-19 MED ORDER — PANTOPRAZOLE SODIUM 40 MG IV SOLR
40.0000 mg | Freq: Every day | INTRAVENOUS | Status: DC
  Administered 2022-01-19 – 2022-01-20 (×2): 40 mg via INTRAVENOUS
  Filled 2022-01-19 (×2): qty 10

## 2022-01-19 MED ORDER — METOPROLOL TARTRATE 50 MG PO TABS
50.0000 mg | ORAL_TABLET | Freq: Three times a day (TID) | ORAL | Status: DC
  Administered 2022-01-19: 50 mg via ORAL
  Filled 2022-01-19: qty 1

## 2022-01-19 MED ORDER — LORAZEPAM 0.5 MG PO TABS
0.5000 mg | ORAL_TABLET | Freq: Two times a day (BID) | ORAL | Status: DC
  Administered 2022-01-19 – 2022-01-20 (×3): 0.5 mg via ORAL
  Filled 2022-01-19 (×3): qty 1

## 2022-01-19 MED ORDER — METOPROLOL SUCCINATE ER 50 MG PO TB24
50.0000 mg | ORAL_TABLET | Freq: Every day | ORAL | Status: DC
  Administered 2022-01-20: 50 mg via ORAL
  Filled 2022-01-19: qty 1

## 2022-01-19 NOTE — Consult Note (Signed)
Referring Provider: No ref. provider found Primary Care Physician:  Manon Hilding, MD Primary Gastroenterologist:    Date of Admission:01/18/22  Date of Consultation: 01/19/22  Reason for Consultation:  Crohn's Colitis  HPI:  Isabella Scott is a 66 y.o. year old female with history of Crohn's colitis, hypothyroidism, GERD and anxity who presented to ED with c/o abdominal pain over the past 6 weeks, despite prednisone taper prescribed by PCP. Abdominal pain would recur after tapering of steroids. PCP recommended follow up with GI as they did not feel comfortable prescribing another steroid taper. Previously followed with Dr. Oneida Alar, not established with new GI provider but schedule to see Dr. Abbey Chatters next month. On Azithioprine compliantly, refilled by PCP. Denied vomiting. Reported loose stools on occasion that she takes cholestyramine for with good results.   ED Course: VSS WBC 11.3 CT A/P suggest active Crohn's and developing SBO. Decadron 29m given. IV fluids and NPO except ice chips Started on solumedrol 1259mdaily CRP <0.5 Sed RAte 50   Consult: Diagnosed with Crohn's in 1979 with removal of terminal ileum in the past. [facility]  Patient states that about 1.5 years ago she started having some GI issues, mother also passed away around this time which brought a lot of stress. She is maintained on Imuran 10073maily and thinks this is the only medication she has been on to treat her IBD.. SMarland Kitchene was noticing issues with eating and feeling like she had to force herself to eat and having weight loss, approx 20 pounds since onset. She saw PCP who gave her steroids which helped. She completed the course and then symptoms began again. She saw PCP again, did another course of steroids with improvement and then had recurrence of weight loss and abdominal pain. States that dysphagia and difficulty eating resolved after initial  dose of steroids about 1.5 years ago. PCP did not feel comfortable with continuing management of her Crohn's given her ongoing flare ups. She states she had been trying to get established with [facility] but does not have an appt until April. She denies any abdominal pain. She reports that abdominal pain typically only occurs after eating, usually in Mid to right/lower abdomen. She has not eaten anything since yesterday around noon, ate a banana then but had no pain. She states that she typically has a lot of gas and noisy stomach. Takes cholestyramine BID which [place]. She denies any blood in her stools. Having BM 1 to 2 times per day depending on her intake. Denies any nausea or vomiting. Denies melena.   Takes protonix 41m67mily as outpatient which provides good results.    Last EGD:07/28/02  Last Colonoscopy:01/28/17 ileitis, significant looping of colon, anastomotic polypoid lesion, internal hemorrhoids. Other colonic biopsies negative  Past Medical History:  Diagnosis Date   Anxiety    Arthritis    Crohn's disease (HCC)[facility] Dysphagia    GERD (gastroesophageal reflux disease)    Hypothyroidism    [facility]  Past Surgical History:  Procedure Laterality Date   BUNIONECTOMY Left 2000   BUNIONECTOMY Right 2005   COLONOSCOPY N/A 2010   COLONOSCOPY N/A 01/28/2017   Procedure: COLONOSCOPY;  Surgeon: SandDanie Binder;  [facility];  Service: Endoscopy;  Laterality: N/A;  9:30am   ESOPHAGOGASTRODUODENOSCOPY N/A 2010   with dilation   PARTIAL COLECTOMY  N/A 1981   terminal ileum resection    Prior to Admission medications   Medication Sig Start Date End Date Taking? Authorizing Provider  acetaminophen (TYLENOL) 500 MG tablet Take 500-1,000 mg by mouth daily as needed for moderate pain or headache.   Yes [provider]  azaTHIOprine (IMURAN) 50 MG tablet Take 100 mg by mouth daily.   Yes [provider]  Calcium Carb-Cholecalciferol 600-800 MG-UNIT TABS Take 1 tablet by mouth daily.   Yes [provider]  Chlorpheniramine-Phenylephrine (SINUS & ALLERGY PO) Take by mouth.   Yes [provider]  Cholecalciferol (VITAMIN D-3) 25 MCG (1000 UT) CAPS Take 1 capsule by mouth daily.   Yes [provider]  cholestyramine light (PREVALITE) 4 GM/DOSE powder Take 4 g by mouth daily. 12/20/21  Yes [provider]  citalopram (CELEXA) 20 MG tablet Take 40 mg by mouth daily. 01/22/17  Yes [provider]  clobetasol ointment (TEMOVATE) 9.73 % Apply 1 application. topically every evening. 01/09/21  Yes [provider]  Cyanocobalamin (VITAMIN B-12 IJ) Inject 1 Dose as directed every 30 (thirty) days.    Yes [provider]  fluticasone (FLONASE) 50 MCG/ACT nasal spray Place 2 sprays into both nostrils daily.   Yes [provider]  levothyroxine (SYNTHROID) 112 MCG tablet Take 1 tablet (112 mcg total) by mouth daily. 09/05/21  Yes Nida, Marella Chimes, MD  LORazepam (ATIVAN) 0.5 MG tablet Take 0.5 mg by mouth in the morning, at noon, and at bedtime.   Yes [provider]  metoprolol tartrate (LOPRESSOR) 25 MG tablet Take 50 mg by mouth 3 (three) times daily. Take 2 tablets daily   Yes [provider]  Multiple Vitamin (MULTIVITAMIN) capsule Take 1 capsule by mouth daily.   Yes [provider]  Naphazoline-Pheniramine (OPCON-A OP) Apply 1 drop to eye daily as needed (allergies).   Yes [provider]  pantoprazole (PROTONIX) 40 MG tablet Take 1 tablet by mouth daily. 07/25/20  Yes [provider]  potassium chloride (KLOR-CON) 10 MEQ tablet Take 10 mEq by mouth 2 (two) times daily. 01/10/22  Yes [provider]  Tetrahydrozoline HCl (VISINE OP) Apply 1 drop to eye daily as needed (dry eyes).   Yes [provider]  valACYclovir (VALTREX) 500 MG tablet Take 500 mg by mouth 2  (two) times daily. As needed   Yes [provider]    Current Facility-Administered Medications  Medication Dose Route Frequency Provider Last Rate Last Admin   acetaminophen (TYLENOL) tablet 650 mg  650 mg Oral Q6H PRN Emokpae, Courage, MD       Or   acetaminophen (TYLENOL) suppository 650 mg  650 mg Rectal Q6H PRN Emokpae, Courage, MD       citalopram (CELEXA) tablet 40 mg  40 mg Oral Daily Emokpae, Courage, MD   40 mg at 01/19/22 0849   dextrose 5 %-0.9 % sodium chloride infusion   Intravenous Continuous Denton Brick, Courage, MD 75 mL/hr at 01/19/22 0854 New Bag at 01/19/22 0854   levothyroxine (SYNTHROID) tablet 112 mcg  112 mcg Oral Q0600 Emokpae, Courage, MD   112 mcg at 01/19/22 0850   LORazepam (ATIVAN) tablet 0.5 mg  0.5 mg Oral BID Emokpae, Courage, MD   0.5 mg at 01/19/22 0849   methylPREDNISolone sodium succinate (SOLU-MEDROL) 125 mg/2 mL injection 120 mg  120 mg Intravenous Daily Emokpae, Courage, MD   120 mg at 01/19/22 0848   metoprolol tartrate (LOPRESSOR) tablet 50 mg  50 mg Oral TID Roxan Hockey, MD   50 mg at 01/19/22 0849   morphine (PF) 2 MG/ML injection 2 mg  2 mg Intravenous Q4H PRN Emokpae, Courage, MD       ondansetron (ZOFRAN) tablet 4 mg  4 mg Oral Q6H PRN Emokpae, Courage, MD       Or   ondansetron (ZOFRAN) injection 4 mg  4 mg Intravenous Q6H PRN Roxan Hockey, MD        Allergies as of 01/18/2022 - Review Complete 01/18/2022  Allergen Reaction Noted   Meperidine  06/12/2011   Mirtazapine  05/03/2021   Morphine and related  06/12/2011   Sulfamethoxazole Itching and Rash 08/13/2012    Family History  Problem Relation Age of Onset   Heart disease Father    Heart disease Brother    Kidney disease Brother    Throat cancer Maternal Uncle    Lung cancer Maternal Grandfather    Colon cancer Neg Hx    Crohn's disease Neg Hx    Ulcerative colitis Neg Hx     Social History   Socioeconomic History   Marital status: Married    Spouse name: Not  on file   Number of children: Not on file   Years of education: Not on file   Highest education level: Not on file  Occupational History   Not on file  Tobacco Use   Smoking status: Former    Types: Cigarettes    Quit date: 01/03/1997    Years since quitting: 25.0   Smokeless tobacco: Never  Vaping Use   Vaping Use: Never used  Substance and Sexual Activity   Alcohol use: No   Drug use: No   Sexual activity: Not on file  Other Topics Concern   Not on file  Social History Narrative   Not on file   Social Determinants of Health   Financial Resource Strain: Not on file  Food Insecurity: Not on file  Transportation Needs: Not on file  Physical Activity: Not on file  Stress: Not on file  Social Connections: Not on file  Intimate Partner Violence: Not on file   Review of Systems: Gen: Denies fever, chills,+decreased appetite, +weight loss CV: Denies chest pain, heart palpitations, syncope, edema  Resp: Denies shortness of breath with rest, cough, wheezing GI: Denies dysphagia or odynophagia. Denies vomiting blood, jaundice, and fecal incontinence.  GU : Denies urinary burning, urinary frequency, urinary incontinence.  MS: Denies joint pain,swelling, cramping Derm: Denies rash, itching, dry skin Psych: Denies depression, anxiety,confusion, or memory loss Heme: Denies bruising, bleeding, and enlarged lymph nodes.  Physical Exam: Vital signs in last 24 hours: Temp:  [98.1 F (36.7 C)-98.4 F (36.9 C)] 98.4 F (36.9 C) (03/10 0847) Pulse Rate:  [66-87] 78 (03/10 0847) Resp:  [14-20] 20 (03/10 0847) BP: (123-144)/(58-85) 138/80 (03/10 0847) SpO2:  [95 %-100 %] 100 % (03/10 0847) FiO2 (%):  [21 %] 21 % (03/09 2111) Weight:  [68.1 kg-68.9 kg] 68.1 kg (03/09 2018) Last BM Date : 01/18/22 General:   Alert,  Well-developed, well-nourished, pleasant and cooperative in NAD Head:  Normocephalic and atraumatic. Eyes:  Sclera clear, no icterus.   Conjunctiva pink. Ears:  Normal  auditory acuity. Nose:  No deformity, discharge,  or lesions. Mouth:  No deformity or lesions, dentition normal. Lungs:  Clear throughout to auscultation.   No wheezes, crackles, or rhonchi. No acute distress. Heart:  Regular rate and rhythm; no murmurs, clicks, rubs,  or gallops. Abdomen:  Soft, and nondistended. TTP of RLQ. No masses, hepatosplenomegaly or hernias noted. Normal bowel sounds, without guarding, and without rebound.   Rectal:  Deferred until time of colonoscopy.   Msk:  Symmetrical without gross deformities. Normal posture. Pulses:  Normal pulses noted. Extremities:  Without clubbing or edema. Neurologic:  Alert and  oriented x4;  grossly normal neurologically. Skin:  Intact without significant lesions or rashes. Psych:  Alert and cooperative. Normal mood and affect.  Intake/Output from previous day: 03/09 0701 - 03/10 0700 In: 300 [I.V.:300] Out: -  Intake/Output this shift: No intake/output data recorded.  Lab Results: Recent Labs    01/18/22 1332 01/19/22 0438  WBC 11.3* 14.9*  HGB 13.2 11.8*  HCT 41.2 37.0  PLT 324 309   BMET Recent Labs    01/18/22 1332 01/19/22 0438  NA 135 139  K 4.4 4.0  CL 99 105  CO2 28 23  GLUCOSE 110* 92  BUN 9 10  CREATININE 0.89 0.89  CALCIUM 8.7* 8.4*   LFT Recent Labs    01/18/22 1332  PROT 7.3  ALBUMIN 3.3*  AST 19  ALT 13  ALKPHOS 67  BILITOT 0.5    Studies/Results: CT ABDOMEN PELVIS W CONTRAST  Result Date: 01/18/2022 CLINICAL DATA:  Crohn's disease with left lower quadrant pain. EXAM: CT ABDOMEN AND PELVIS WITH CONTRAST TECHNIQUE: Multidetector CT imaging of the abdomen and pelvis was performed using the standard protocol following bolus administration of intravenous contrast. RADIATION DOSE REDUCTION: This exam was performed according to the departmental dose-optimization program which includes automated exposure control, adjustment of the mA and/or kV according to patient size and/or use of iterative  reconstruction technique. CONTRAST:  152m OMNIPAQUE IOHEXOL 300 MG/ML  SOLN COMPARISON:  None. FINDINGS: Lower chest: There are calcified granulomas in the right lung base. Hepatobiliary: Gallstones are present. There is no biliary ductal dilatation. The liver is within normal limits. Pancreas: Unremarkable. No pancreatic ductal dilatation or surrounding inflammatory changes. Spleen: Normal in size without focal abnormality. Adrenals/Urinary Tract: Adrenal glands are unremarkable. Kidneys are normal, without renal calculi, focal lesion, or hydronephrosis. Bladder is unremarkable. Stomach/Bowel: The right colon appears absent. There is marked wall thickening, submucosal enhancement and surrounding inflammatory stranding involving the distal ileum in the right abdomen. There is surrounding mesenteric haziness and small lymph nodes. Margins are slightly spiculated. Just proximal to this level there are dilated small bowel loops measuring up to 3.7 cm. Jejunal loops and stomach are nondilated. Colon is nondilated. Appendix is not seen. No pneumatosis or free air. Vascular/Lymphatic: No significant vascular findings are present. No enlarged abdominal or pelvic lymph nodes. Reproductive: The uterus is enlarged and heterogeneous containing multiple fibroids with calcifications. Largest fibroid measures 4.7 cm. Ovaries are nonenlarged. Other: There is trace free fluid in the pelvis. There is a small fat containing umbilical hernia. Sutures are seen along the anterior abdominal wall. No focal abscess identified. Musculoskeletal: No acute or significant osseous findings. IMPRESSION: 1. Distal ileal wall thickening with some tethering/spiculation along and submucosal enhancement. There is surrounding mild inflammation and small reactive lymph nodes. Findings are concerning for active Crohn's disease and/or scarring. Underlying neoplasm not excluded. There is resultant developing small bowel obstruction. 2. Trace free fluid in  the pelvis. 3. Cholelithiasis. 4. Fibroid uterus. Electronically Signed   By: ARonney AstersM.D.   On: 01/18/2022 17:48    Impression: AFLOWER FRANKOis a 66y.o. year old female with history of Crohn's colitis, hypothyroidism, GERD and anxity who presented  to ED with c/o abdominal pain over the past 6 weeks, despite prednisone taper prescribed by PCP. Abdominal pain would recur after tapering of steroids. PCP recommended follow up with GI as they did not feel comfortable prescribing another steroid taper. Previously followed with Dr. Oneida Alar, not established with new GI provider but schedule to see Dr. Abbey Chatters next month. On Azithioprine compliantly, refilled by PCP. Denied vomiting. Reported loose stools on occasion that she takes cholestyramine for with good results.   Crohn's Colitis with developing SBO: Crohn's disease diagnosed in 1979 with removal of terminal ileum many years ago. Has been maintained on azathioprine for many years and seemed to be doing well though last Colonoscopy in 2018 revealed active, chronic ileitis. Recent flare up of abdominal pain, decreased appetite with some associated weight loss over the past year and a half that has responded only to higher doses of prednisone, as patient tells me that on 30-40mg symptoms were improved, though began to recur after tapering down to 18m. Patient is biologic naive. She is tolerating sips of water well and has minimal abdominal pain. Denies diarrhea, rectal bleeding or melena. No mucus in stools. Will advance diet to clear liquids as she has no nausea or vomiting and continue with solumedrol 656mdaily over the next few days. I did discuss with patient that other medical treatment for her Crohn's disease will likely be warranted given recent flares. Will check Hep B serologies and TB quant in preparation of possible initiation of biologic therapy on outpatient basis after acute flare. CRP <0.5 and Sed Rate 50   Plan: Solumedrol 6068mV  daily PPI daily Advance diet to clear liquids Hep B testing  TB quant testing Continue supportive measures Pain and nausea meds PRN per hospitalist   LOS: 1 day    01/19/2022, 10:27 AM   Taniqua Issa L. CarAlver SorrowSN, APRN, AGNP-C Adult-Gerontology Nurse Practitioner [facility]

## 2022-01-19 NOTE — Progress Notes (Signed)
PROGRESS NOTE     Isabella Scott, is a 66 y.o. female, DOB - [birth year], ZHY:[id number]  Admit date - 01/18/2022   Admitting Physician Onnie Boer, MD  Outpatient Primary MD for the patient is Sasser, Clarene Critchley, MD  LOS - 1  Chief Complaint  Patient presents with   Abdominal Pain        Brief Narrative:  -year-old with history of anxiety, GERD, hypothyroidism status post prior radioablation, and history of Crohn's colitis diagnosed in 1979 with subsequent removal of the terminal ileum previously admitted on 01/18/2022 with acute flareup of Crohn's colitis    -Assessment and Plan: * Crohn's colitis with concerns for SBO --Treated with steroids and Imuran previously -GI consult appreciated okay to change IV Solu-Medrol to 60mg  daily -Patient is biologic nave may be a candidate for biologic if frequent/persistent flareup -- We will check hep B and TB screening -ESR is 50, CRP less than 0.5 - -Per GI service okay to try liquid diet -= Continue IV fluids pending tolerance of oral intake and return of bowel function   HTN (hypertension) Hold the metoprolol. -As needed labetalol while N. p.o.  Hypothyroidism following radioiodine therapy -Restart levothyroxine  Disposition/Need for in-Hospital Stay- patient unable to be discharged at this time due to acute Crohn's colitis flareup requiring IV steroids and IV fluids pending return of bowel function and tolerance of oral intake  Status is: Inpatient   Disposition: The patient is from: Home              Anticipated d/c is to: Home              Anticipated d/c date is: 1 day              Patient currently is not medically stable to d/c. Barriers: Not Clinically Stable-   Code Status :  -  Code Status: Full Code   Family Communication:   (patient is alert, awake and coherent)  Discussed with patient's sister at bedside  DVT Prophylaxis  :   - SCDs  SCDs Start: 01/18/22 2112   Lab Results  Component Value Date   PLT  309 01/19/2022    Inpatient Medications  Scheduled Meds:  citalopram  40 mg Oral Daily   levothyroxine  112 mcg Oral Q0600   LORazepam  0.5 mg Oral BID   [START ON 01/20/2022] methylPREDNISolone (SOLU-MEDROL) injection  60 mg Intravenous Daily   metoprolol tartrate  50 mg Oral TID   pantoprazole (PROTONIX) IV  40 mg Intravenous Daily   Continuous Infusions:  dextrose 5 % and 0.9% NaCl 75 mL/hr at 01/19/22 0854   PRN Meds:.acetaminophen **OR** acetaminophen, morphine injection, ondansetron **OR** ondansetron (ZOFRAN) IV   Anti-infectives (From admission, onward)    None         Subjective: Isabella Scott today has no fevers, no emesis,  No chest pain,   -Willing to try oral liquids -No BM no flatus   Objective: Vitals:   01/19/22 0029 01/19/22 0442 01/19/22 0847 01/19/22 1343  BP: 133/76 (!) 123/58 138/80 130/69  Pulse: 66 69 78 72  Resp: 18 18 20 20   Temp: 98.2 F (36.8 C) 98.2 F (36.8 C) 98.4 F (36.9 C) 98.9 F (37.2 C)  TempSrc: Oral Oral Oral Oral  SpO2: 95% 98% 100% 98%  Weight:      Height:        Intake/Output Summary (Last 24 hours) at 01/19/2022 1624 Last data filed at 01/19/2022 1000 Gross  per 24 hour  Intake 300 ml  Output --  Net 300 ml   Filed Weights   01/18/22 1155 01/18/22 2018  Weight: 68.9 kg 68.1 kg    Physical Exam  Gen:- Awake Alert,  In no apparent distress  HEENT:- [place].AT, No sclera icterus Neck-Supple Neck,No JVD,.  Lungs-  CTAB , fair symmetrical air movement CV- S1, S2 normal, regular  Abd-  +ve B.Sounds, Abd Soft, discomfort abdominal palpation no rebound no guarding    Extremity/Skin:- No  edema, pedal pulses present  Psych-affect is appropriate, oriented x3 Neuro-no new focal deficits, no tremors  Data Reviewed: I have personally reviewed following labs and imaging studies  CBC: Recent Labs  Lab 01/18/22 1332 01/19/22 0438  WBC 11.3* 14.9*  NEUTROABS 9.4*  --   HGB 13.2 11.8*  HCT 41.2 37.0  MCV 94.1 93.4   PLT 324 309   Basic Metabolic Panel: Recent Labs  Lab 01/18/22 1332 01/19/22 0438  NA 135 139  K 4.4 4.0  CL 99 105  CO2 28 23  GLUCOSE 110* 92  BUN 9 10  CREATININE 0.89 0.89  CALCIUM 8.7* 8.4*   GFR: Estimated Creatinine Clearance: 58.7 mL/min (by C-G formula based on SCr of 0.89 mg/dL). Liver Function Tests: Recent Labs  Lab 01/18/22 1332  AST 19  ALT 13  ALKPHOS 67  BILITOT 0.5  PROT 7.3  ALBUMIN 3.3*   Cardiac Enzymes: No results for input(s): CKTOTAL, CKMB, CKMBINDEX, TROPONINI in the last 168 hours. BNP (last 3 results) No results for input(s): PROBNP in the last 8760 hours. HbA1C: No results for input(s): HGBA1C in the last 72 hours. Sepsis Labs: @LABRCNTIP (procalcitonin:4,lacticidven:4) ) Recent Results (from the past 240 hour(s))  Resp Panel by RT-PCR (Flu A&B, Covid) Nasopharyngeal Swab     Status: None   Collection Time: 01/18/22  6:35 PM   Specimen: Nasopharyngeal Swab; Nasopharyngeal(NP) swabs in vial transport medium  Result Value Ref Range Status   SARS Coronavirus 2 by RT PCR NEGATIVE NEGATIVE Final    Comment: (NOTE) SARS-CoV-2 target nucleic acids are NOT DETECTED.  The SARS-CoV-2 RNA is generally detectable in upper respiratory specimens during the acute phase of infection. The lowest concentration of SARS-CoV-2 viral copies this assay can detect is 138 copies/mL. A negative result does not preclude SARS-Cov-2 infection and should not be used as the sole basis for treatment or other patient management decisions. A negative result may occur with  improper specimen collection/handling, submission of specimen other than nasopharyngeal swab, presence of viral mutation(s) within the areas targeted by this assay, and inadequate number of viral copies(<138 copies/mL). A negative result must be combined with clinical observations, patient history, and epidemiological information. The expected result is Negative.  Fact Sheet for Patients:   BloggerCourse.com  Fact Sheet for Healthcare Providers:  SeriousBroker.it  This test is no t yet approved or cleared by the [place] FDA and  has been authorized for detection and/or diagnosis of SARS-CoV-2 by FDA under an Emergency Use Authorization (EUA). This EUA will remain  in effect (meaning this test can be used) for the duration of the COVID-19 declaration under Section 564(b)(1) of the Act, 21 U.S.C.section 360bbb-3(b)(1), unless the authorization is terminated  or revoked sooner.       Influenza A by PCR NEGATIVE NEGATIVE Final   Influenza B by PCR NEGATIVE NEGATIVE Final    Comment: (NOTE) The Xpert Xpress SARS-CoV-2/FLU/RSV plus assay is intended as an aid in the diagnosis of influenza from Nasopharyngeal  swab specimens and should not be used as a sole basis for treatment. Nasal washings and aspirates are unacceptable for Xpert Xpress SARS-CoV-2/FLU/RSV testing.  Fact Sheet for Patients: BloggerCourse.com  Fact Sheet for Healthcare Providers: SeriousBroker.it  This test is not yet approved or cleared by the [place] FDA and has been authorized for detection and/or diagnosis of SARS-CoV-2 by FDA under an Emergency Use Authorization (EUA). This EUA will remain in effect (meaning this test can be used) for the duration of the COVID-19 declaration under Section 564(b)(1) of the Act, 21 U.S.C. section 360bbb-3(b)(1), unless the authorization is terminated or revoked.  Performed at [facility], [address], [place], [place] [address]       Radiology Studies: CT ABDOMEN PELVIS W CONTRAST  Result Date: 01/18/2022 CLINICAL DATA:  Crohn's disease with left lower quadrant pain. EXAM: CT ABDOMEN AND PELVIS WITH CONTRAST TECHNIQUE: Multidetector CT imaging of the abdomen and pelvis was performed using the standard protocol following bolus administration of  intravenous contrast. RADIATION DOSE REDUCTION: This exam was performed according to the departmental dose-optimization program which includes automated exposure control, adjustment of the mA and/or kV according to patient size and/or use of iterative reconstruction technique. CONTRAST:  OMNIPAQUE IOHEXOL 300 MG/ML  SOLN COMPARISON:  None. FINDINGS: Lower chest: There are calcified granulomas in the right lung base. Hepatobiliary: Gallstones are present. There is no biliary ductal dilatation. The liver is within normal limits. Pancreas: Unremarkable. No pancreatic ductal dilatation or surrounding inflammatory changes. Spleen: Normal in size without focal abnormality. Adrenals/Urinary Tract: Adrenal glands are unremarkable. Kidneys are normal, without renal calculi, focal lesion, or hydronephrosis. Bladder is unremarkable. Stomach/Bowel: The right colon appears absent. There is marked wall thickening, submucosal enhancement and surrounding inflammatory stranding involving the distal ileum in the right abdomen. There is surrounding mesenteric haziness and small lymph nodes. Margins are slightly spiculated. Just proximal to this level there are dilated small bowel loops measuring up to 3.7 cm. Jejunal loops and stomach are nondilated. Colon is nondilated. Appendix is not seen. No pneumatosis or free air. Vascular/Lymphatic: No significant vascular findings are present. No enlarged abdominal or pelvic lymph nodes. Reproductive: The uterus is enlarged and heterogeneous containing multiple fibroids with calcifications. Largest fibroid measures 4.7 cm. Ovaries are nonenlarged. Other: There is trace free fluid in the pelvis. There is a small fat containing umbilical hernia. Sutures are seen along the anterior abdominal wall. No focal abscess identified. Musculoskeletal: No acute or significant osseous findings. IMPRESSION: 1. Distal ileal wall thickening with some tethering/spiculation along and submucosal  enhancement. There is surrounding mild inflammation and small reactive lymph nodes. Findings are concerning for active Crohn's disease and/or scarring. Underlying neoplasm not excluded. There is resultant developing small bowel obstruction. 2. Trace free fluid in the pelvis. 3. Cholelithiasis. 4. Fibroid uterus. Electronically Signed   By: Darliss Cheney M.D.   On: 01/18/2022 17:48     Scheduled Meds:  citalopram  40 mg Oral Daily   levothyroxine  112 mcg Oral Q0600   LORazepam  0.5 mg Oral BID   [START ON 01/20/2022] methylPREDNISolone (SOLU-MEDROL) injection  60 mg Intravenous Daily   metoprolol tartrate  50 mg Oral TID   pantoprazole (PROTONIX) IV  40 mg Intravenous Daily   Continuous Infusions:  dextrose 5 % and 0.9% NaCl 75 mL/hr at 01/19/22 0854     LOS: 1 day    Shon Hale M.D on 01/19/2022 at 4:24 PM  Go to [email or web address] - for contact info  [facility] - Office  [phone]  If 7PM-7AM, please contact night-coverage [email or web address] Password [facility] 01/19/2022, 4:24 PM

## 2022-01-19 NOTE — Progress Notes (Signed)
?  Transition of Care (TOC) Screening Note ? ? ?Patient Details  ?Name: Isabella Scott ?Date of Birth: 1955/12/16 ? ? ?Transition of Care (TOC) CM/SW Contact:    ?Iona Beard, LCSWA ?Phone Number: ?01/19/2022, 10:32 AM ? ? ? ?Transition of Care Department [facility]) has reviewed patient and no TOC needs have been identified at this time. We will continue to monitor patient advancement through interdisciplinary progression rounds. If new patient transition needs arise, please place a TOC consult. ?  ?

## 2022-01-20 ENCOUNTER — Telehealth: Payer: Self-pay | Admitting: Internal Medicine

## 2022-01-20 LAB — CBC
HCT: 36.9 % (ref 36.0–46.0)
Hemoglobin: 12.2 g/dL (ref 12.0–15.0)
MCH: 31.6 pg (ref 26.0–34.0)
MCHC: 33.1 g/dL (ref 30.0–36.0)
MCV: 95.6 fL (ref 80.0–100.0)
Platelets: 263 10*3/uL (ref 150–400)
RBC: 3.86 MIL/uL — ABNORMAL LOW (ref 3.87–5.11)
RDW: 16.5 % — ABNORMAL HIGH (ref 11.5–15.5)
WBC: 11.3 10*3/uL — ABNORMAL HIGH (ref 4.0–10.5)
nRBC: 0 % (ref 0.0–0.2)

## 2022-01-20 LAB — GLUCOSE, CAPILLARY
Glucose-Capillary: 100 mg/dL — ABNORMAL HIGH (ref 70–99)
Glucose-Capillary: 84 mg/dL (ref 70–99)
Glucose-Capillary: 78 mg/dL (ref 70–99)

## 2022-01-20 LAB — HEPATITIS B CORE ANTIBODY, TOTAL: Hep B Core Total Ab: NONREACTIVE

## 2022-01-20 LAB — HEPATITIS B SURFACE ANTIGEN: Hepatitis B Surface Ag: NONREACTIVE

## 2022-01-20 MED ORDER — PANTOPRAZOLE SODIUM 40 MG PO TBEC: 40.0000 mg | DELAYED_RELEASE_TABLET | Freq: Every day | ORAL | 3 refills | Status: AC

## 2022-01-20 MED ORDER — METOPROLOL SUCCINATE ER 50 MG PO TB24: 50.0000 mg | ORAL_TABLET | Freq: Every day | ORAL | 4 refills | Status: DC

## 2022-01-20 MED ORDER — METHYLPREDNISOLONE SODIUM SUCC 125 MG IJ SOLR
125.0000 mg | Freq: Once | INTRAMUSCULAR | Status: AC
  Administered 2022-01-20: 125 mg via INTRAMUSCULAR
  Filled 2022-01-20: qty 2

## 2022-01-20 MED ORDER — PREDNISONE 10 MG PO TABS: 10.0000 mg | ORAL_TABLET | ORAL | 0 refills | Status: DC

## 2022-01-20 MED ORDER — METHYLPREDNISOLONE SODIUM SUCC 125 MG IJ SOLR
INTRAMUSCULAR | Status: AC
  Filled 2022-01-20: qty 2

## 2022-01-20 NOTE — Discharge Instructions (Signed)
1)Take Prednisone  40 mg (4 Tablets) daily for  2 weeks, 30 mg (3 Tablets) daily for  2 weeks, 20 mg (2 Tablets) daily for  2 weeks, 10 mg  (1 tablets) daily for  2 weeks---then STOP ? ?2)Follow-up Gastroenterologist Dr. Hurshel Keys with [facility]---on March 30 th  for Re-evaluation/Recheck  ?-address: [address] [place], [place] [address], Phone: [phone] ? ?3)Avoid ibuprofen/Advil/Aleve/Motrin/Goody Powders/Naproxen/BC powders/Meloxicam/Diclofenac/Indomethacin and other Nonsteroidal anti-inflammatory medications as these will make you more likely to bleed and can cause stomach ulcers, can also cause Kidney problems.  ? ?4) some of your medications including metoprolol has been adjusted/changed ?

## 2022-01-20 NOTE — Progress Notes (Signed)
Patient doing well today.  Tolerating full liquids.  Abdominal pain much improved.  Okay to discharge from GI standpoint.  I will arrange for urgent follow-up in our clinic.  She would likely need escalation of therapy to biologic.  TB and viral hepatitis blood work has been ordered.  Discussed this with patient and she understands.  Would recommend 8-week prednisone taper, 40 mg x 2 weeks, 30 mg x 2 weeks, 20 mg x 2 weeks, 10 mg x 2 weeks. ?

## 2022-01-20 NOTE — Telephone Encounter (Signed)
Patient needs hospital follow-up visit with me.  Appears that she is scheduled on 4/19 though I would like to see her sooner than this.  Looks like I have openings on March 30.  Can we call and schedule her to see me?  Dx Crohn's disease.  Thank you ?

## 2022-01-20 NOTE — Discharge Summary (Signed)
Isabella Scott, is a 66 y.o. female  DOB 1956-06-27  MRN [id number].  Admission date:  01/18/2022  Admitting Physician  Bethena Roys, MD  Discharge Date:  01/20/2022   Primary MD  Quintin Alto, Silvestre Moment, MD  Recommendations for primary care physician for things to follow:  1)Take Prednisone  40 mg (4 Tablets) daily for  2 weeks, 30 mg (3 Tablets) daily for  2 weeks, 20 mg (2 Tablets) daily for  2 weeks, 10 mg  (1 tablets) daily for  2 weeks---then STOP  2)Follow-up Gastroenterologist Dr. Hurshel Keys with [facility]---on March 30 th  for Re-evaluation/Recheck  -address: [address] [place], [place] [address], Phone: [phone]  3)Avoid ibuprofen/Advil/Aleve/Motrin/Goody Powders/Naproxen/BC powders/Meloxicam/Diclofenac/Indomethacin and other Nonsteroidal anti-inflammatory medications as these will make you more likely to bleed and can cause stomach ulcers, can also cause Kidney problems.   4) some of your medications including metoprolol has been adjusted/changed  Admission Diagnosis  Crohn's colitis ([place]) [K50.10] Crohn's disease of small intestine with intestinal obstruction ([place]) [K50.012]   Discharge Diagnosis  Crohn's colitis ([place]) [K50.10] Crohn's disease of small intestine with intestinal obstruction ([place]) [K50.012]    Principal Problem:   Crohn's colitis ([place]) Active Problems:   Graves' disease   Hypothyroidism following radioiodine therapy   SBO (small bowel obstruction) (HCC)   HTN (hypertension)      Past Medical History:  Diagnosis Date   Anxiety    Arthritis    Crohn's disease ([place])    Dysphagia    GERD (gastroesophageal reflux disease)    Hypothyroidism    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 female with medical history significant for Crohn's colitis, dizziness and subsequent hypothyroidism and radioiodine ablation. Patient presented to the ED with complaints of abdominal pain persistent over the past 6 weeks, she was prescribed a course of prednisone taper by her primary care provider.  Her abdominal pain would re-occur when her prednisone was being tapered.  With a second recurrence of abdominal pain, outpatient provider recommended she follow-up with gastroenterology, did not feel comfortable refilling her prednisone.   Patient's previously followed with Dr. Oneida Alar, she had not established care with a new gastroenterologist, and was scheduled to see Dr. Abbey Chatters next month.  She is on azathioprine and compliant, she had this refilled by her primary care provider.  Has not had any issues related to her Crohn's for the  past few years.   She denies vomiting.  She would have loose stools, but she takes cholestyramine and this helps.  Her last bowel movement was this morning.  It was loose as she did not take her cholestyramine.  Denies urinary symptoms .   ED Course: Stable vitals.  WBC 11.3.  UA with small leukocytes.  Abdominal CT suggest active Crohn's, and developing small bowel obstruction.  Decadron 10 mg given. EDP talked to gastroenterologist Dr. Abbey Chatters, recommended admission, IV fluids, steroids, will see in consult in the morning.  N.p.o. except for ice chips.   Review of Systems: As per HPI all other systems reviewed and negative    Hospital Course:     Assessment and Plan:  Brief Narrative:   -66 year-old with history of anxiety, GERD, hypothyroidism status post prior radioablation, and history of Crohn's colitis diagnosed in 1979 with subsequent removal of the terminal ileum previously admitted on 01/18/2022 with acute flareup of Crohn's colitis     -Assessment and Plan: * Crohn's colitis with concerns for SBO --Treated with steroids and Imuran previously -GI consult appreciated  Patient was treated with IV Solu-Medrol through 01/20/2022-Patient is biologic nave may be a candidate for biologic if frequent/persistent flareup -hep B and TB screening pending -ESR is 50, CRP less than 0.5 -Patient tolerated full liquid diet well, -Patient had BM and is passing gas -Abdominal pain resolved -GI service okay to discharge home on tapering doses of prednisone for the next 8 weeks -Outpatient follow-up with Dr. Abbey Chatters on 02/08/2022   HTN (hypertension) -change metoprolol to Toprol-XL 50 mg daily   Hypothyroidism following radioiodine therapy -Continue levothyroxine   Disposition--- Home in stable condition   Disposition: The patient is from: Home              Anticipated d/c is to: Home Follow UP   Follow-up Information     Eloise Harman, DO. Schedule an appointment as soon as possible for a visit on 02/08/2022.   Specialty: Gastroenterology Contact information: [place] [address] [phone]                  Consults obtained - Gi  Diet and Activity recommendation:  As advised  Discharge Instructions    Discharge Instructions     Call MD for:  difficulty breathing, headache or visual disturbances   Complete by: As directed    Call MD for:  persistant dizziness or light-headedness   Complete by: As directed    Call MD for:  persistant nausea and vomiting   Complete by: As directed    Call MD for:  severe uncontrolled pain   Complete by: As directed    Call MD for:  temperature >100.4   Complete by: As directed    Diet - low sodium  heart healthy   Complete by: As directed    Discharge instructions   Complete by: As directed    1)Take Prednisone  40 mg (4 Tablets) daily for  2 weeks, 30 mg (3 Tablets) daily for  2 weeks, 20 mg (2 Tablets) daily for  2 weeks, 10 mg  (1 tablets) daily for  2 weeks---then STOP  2)Follow-up Gastroenterologist Dr. Hurshel Keys with [facility]---on March 30 th  for Re-evaluation/Recheck  -address: [address], [place],  [address], Phone: [phone]  3)Avoid ibuprofen/Advil/Aleve/Motrin/Goody Powders/Naproxen/BC powders/Meloxicam/Diclofenac/Indomethacin and other Nonsteroidal anti-inflammatory medications as these will make you more likely to bleed and can cause stomach ulcers, can also  cause Kidney problems.   4) some of your medications including metoprolol has been adjusted/changed   Increase activity slowly   Complete by: As directed          Discharge Medications     Allergies as of 01/20/2022       Reactions   Meperidine    Funny feeling   Mirtazapine    "Makes me feel out of body like"   Morphine And Related    Funny feeling   Sulfamethoxazole Itching, Rash        Medication List     STOP taking these medications    metoprolol tartrate 25 MG tablet Commonly known as: LOPRESSOR       TAKE these medications    acetaminophen 500 MG tablet Commonly known as: TYLENOL Take 500-1,000 mg by mouth daily as needed for moderate pain or headache.   azaTHIOprine 50 MG tablet Commonly known as: IMURAN Take 100 mg by mouth daily.   Calcium Carb-Cholecalciferol 600-800 MG-UNIT Tabs Take 1 tablet by mouth daily.   cholestyramine light 4 GM/DOSE powder Commonly known as: PREVALITE Take 4 g by mouth daily.   citalopram 20 MG tablet Commonly known as: CELEXA Take 40 mg by mouth daily.   clobetasol ointment 0.05 % Commonly known as: TEMOVATE Apply 1 application. topically every evening.   fluticasone 50 MCG/ACT nasal  spray Commonly known as: FLONASE Place 2 sprays into both nostrils daily.   levothyroxine 112 MCG tablet Commonly known as: SYNTHROID Take 1 tablet (112 mcg total) by mouth daily.   LORazepam 0.5 MG tablet Commonly known as: ATIVAN Take 0.5 mg by mouth in the morning, at noon, and at bedtime.   metoprolol succinate 50 MG 24 hr tablet Commonly known as: TOPROL-XL Take 1 tablet (50 mg total) by mouth daily. Take with or immediately following a meal for BP Start taking on: January 21, 2022   multivitamin capsule Take 1 capsule by mouth daily.   OPCON-A OP Apply 1 drop to eye daily as needed (allergies).   pantoprazole 40 MG tablet Commonly known as: PROTONIX Take 1 tablet (40 mg total) by mouth daily.   potassium chloride 10 MEQ tablet Commonly known as: KLOR-CON Take 10 mEq by mouth 2 (two) times daily.   predniSONE 10 MG tablet Commonly known as: DELTASONE Take 1-4 tablets (10-40 mg total) by mouth See admin instructions. Prednisone  40 mg (4 Tab) daily for  2 weeks, 30 mg (3 Tab) daily for  2 weeks, 20 mg (2 Tab) daily for  2 weeks, 10 mg  (1 tab) daily for  2 weeks---   SINUS & ALLERGY PO Take by mouth.   valACYclovir 500 MG tablet Commonly known as: VALTREX Take 500 mg by mouth 2 (two) times daily. As needed   VISINE OP Apply 1 drop to eye daily as needed (dry eyes).   VITAMIN B-12 IJ Inject 1 Dose as directed every 30 (thirty) days.   Vitamin D-3 25 MCG (1000 UT) Caps Take 1 capsule by mouth daily.        Major procedures and Radiology Reports - PLEASE review detailed and final reports for all details, in brief -   CT ABDOMEN PELVIS W CONTRAST  Result Date: 01/18/2022 CLINICAL DATA:  Crohn's disease with left lower quadrant pain. EXAM: CT ABDOMEN AND PELVIS WITH CONTRAST TECHNIQUE: Multidetector CT imaging of the abdomen and pelvis was performed using the standard protocol following bolus administration of intravenous contrast. RADIATION DOSE REDUCTION: This  exam was performed according to the departmental dose-optimization program which includes automated exposure control, adjustment of the mA and/or kV according to patient size and/or use of iterative reconstruction technique. CONTRAST:  166m OMNIPAQUE IOHEXOL 300 MG/ML  SOLN COMPARISON:  None. FINDINGS: Lower chest: There are calcified granulomas in the right lung base. Hepatobiliary: Gallstones are present. There is no biliary ductal dilatation. The liver is within normal limits. Pancreas: Unremarkable. No pancreatic ductal dilatation or surrounding inflammatory changes. Spleen: Normal in size without focal abnormality. Adrenals/Urinary Tract: Adrenal glands are unremarkable. Kidneys are normal, without renal calculi, focal lesion, or hydronephrosis. Bladder is unremarkable. Stomach/Bowel: The right colon appears absent. There is marked wall thickening, submucosal enhancement and surrounding inflammatory stranding involving the distal ileum in the right abdomen. There is surrounding mesenteric haziness and small lymph nodes. Margins are slightly spiculated. Just proximal to this level there are dilated small bowel loops measuring up to 3.7 cm. Jejunal loops and stomach are nondilated. Colon is nondilated. Appendix is not seen. No pneumatosis or free air. Vascular/Lymphatic: No significant vascular findings are present. No enlarged abdominal or pelvic lymph nodes. Reproductive: The uterus is enlarged and heterogeneous containing multiple fibroids with calcifications. Largest fibroid measures 4.7 cm. Ovaries are nonenlarged. Other: There is trace free fluid in the pelvis. There is a small fat containing umbilical hernia. Sutures are seen along the anterior abdominal wall. No focal abscess identified. Musculoskeletal: No acute or significant osseous findings. IMPRESSION: 1. Distal ileal wall thickening with some tethering/spiculation along and submucosal enhancement. There is surrounding mild inflammation and small  reactive lymph nodes. Findings are concerning for active Crohn's disease and/or scarring. Underlying neoplasm not excluded. There is resultant developing small bowel obstruction. 2. Trace free fluid in the pelvis. 3. Cholelithiasis. 4. Fibroid uterus. Electronically Signed   By: ARonney AstersM.D.   On: 01/18/2022 17:48    Micro Results   Recent Results (from the past 240 hour(s))  Resp Panel by RT-PCR (Flu A&B, Covid) Nasopharyngeal Swab     Status: None   Collection Time: 01/18/22  6:35 PM   Specimen: Nasopharyngeal Swab; Nasopharyngeal(NP) swabs in vial transport medium  Result Value Ref Range Status   SARS Coronavirus 2 by RT PCR NEGATIVE NEGATIVE Final    Comment: (NOTE) SARS-CoV-2 target nucleic acids are NOT DETECTED.  The SARS-CoV-2 RNA is generally detectable in upper respiratory specimens during the acute phase of infection. The lowest concentration of SARS-CoV-2 viral copies this assay can detect is 138 copies/mL. A negative result does not preclude SARS-Cov-2 infection and should not be used as the sole basis for treatment or other patient management decisions. A negative result may occur with  improper specimen collection/handling, submission of specimen other than nasopharyngeal swab, presence of viral mutation(s) within the areas targeted by this assay, and inadequate number of viral copies(<138 copies/mL). A negative result must be combined with clinical observations, patient history, and epidemiological information. The expected result is Negative.  Fact Sheet for Patients:  hEntrepreneurPulse.com.au Fact Sheet for Healthcare Providers:  hIncredibleEmployment.be This test is no t yet approved or cleared by the [place] and  has been authorized for detection and/or diagnosis of SARS-CoV-2 by FDA under an Emergency Use Authorization (EUA). This EUA will remain  in effect (meaning this test can be used) for the duration of  the COVID-19 declaration under Section 564(b)(1) of the Act, 21 U.S.C.section 360bbb-3(b)(1), unless the authorization is terminated  or revoked sooner.       Influenza  A by PCR NEGATIVE NEGATIVE Final   Influenza B by PCR NEGATIVE NEGATIVE Final    Comment: (NOTE) The Xpert Xpress SARS-CoV-2/FLU/RSV plus assay is intended as an aid in the diagnosis of influenza from Nasopharyngeal swab specimens and should not be used as a sole basis for treatment. Nasal washings and aspirates are unacceptable for Xpert Xpress SARS-CoV-2/FLU/RSV testing.  Fact Sheet for Patients: EntrepreneurPulse.com.au  Fact Sheet for Healthcare Providers: IncredibleEmployment.be  This test is not yet approved or cleared by the [place] FDA and has been authorized for detection and/or diagnosis of SARS-CoV-2 by FDA under an Emergency Use Authorization (EUA). This EUA will remain in effect (meaning this test can be used) for the duration of the COVID-19 declaration under Section 564(b)(1) of the Act, 21 U.S.C. section 360bbb-3(b)(1), unless the authorization is terminated or revoked.  Performed at [facility], [address], [place], [place] [address]     Today   Subjective    Isabella Scott today has no new complaints  No fever  Or chills  -Eating and drinking well, had BM, -Passing lots of gas          Patient has been seen and examined prior to discharge   Objective   Blood pressure 131/72, pulse 65, temperature 98.5 F (36.9 C), temperature source Oral, resp. rate 18, height 5' 5.5" (1.664 m), weight 68.1 kg, SpO2 99 %.   Intake/Output Summary (Last 24 hours) at 01/20/2022 1210 Last data filed at 01/20/2022 0900 Gross per 24 hour  Intake 1320 ml  Output --  Net 1320 ml    Exam Gen:- Awake Alert, no acute distress  HEENT:- [place].AT, No sclera icterus Neck-Supple Neck,No JVD,.  Lungs-  CTAB , good air movement bilaterally CV- S1, S2 normal,  regular Abd-  +ve B.Sounds, Abd Soft, No tenderness,    Extremity/Skin:- No  edema,   good pulses Psych-affect is appropriate, oriented x3 Neuro-no new focal deficits, no tremors    Data Review   CBC w Diff:  Lab Results  Component Value Date   WBC 11.3 (H) 01/20/2022   HGB 12.2 01/20/2022   HCT 36.9 01/20/2022   PLT 263 01/20/2022   LYMPHOPCT 12 01/18/2022   MONOPCT 2 01/18/2022   EOSPCT 0 01/18/2022   BASOPCT 0 01/18/2022    CMP:  Lab Results  Component Value Date   NA 139 01/19/2022   K 4.0 01/19/2022   CL 105 01/19/2022   CO2 23 01/19/2022   BUN 10 01/19/2022   BUN 6 05/01/2021   CREATININE 0.89 01/19/2022   CREATININE 0.87 02/26/2020   PROT 7.3 01/18/2022   ALBUMIN 3.3 (L) 01/18/2022   BILITOT 0.5 01/18/2022   ALKPHOS 67 01/18/2022   AST 19 01/18/2022   ALT 13 01/18/2022  .  Total Discharge time is about 33 minutes  Roxan Hockey M.D on 01/20/2022 at 12:10 PM  Go to [email or web address] -  for contact info  [facility] - Office  [phone]

## 2022-01-20 NOTE — Progress Notes (Incomplete)
- °   recommend prednisone taper x8 weeks.  40 mg x 2 weeks, 30 mg x 2 weeks, 20 mg x 2 weeks, 10 mg x 2 weeks---  -

## 2022-01-20 NOTE — Progress Notes (Signed)
Pt IV removed prior to discharge. This Probation officer went over AVS with pt and husband present. Pt taken down to discharge via w/c. ?

## 2022-01-22 LAB — HEPATITIS B SURFACE ANTIBODY, QUANTITATIVE: Hep B S AB Quant (Post): <3.1 m[IU]/mL — ABNORMAL LOW (ref >9.9)

## 2022-01-23 LAB — QUANTIFERON-TB GOLD PLUS: QuantiFERON-TB Gold Plus: NEGATIVE

## 2022-01-23 LAB — QUANTIFERON-TB GOLD PLUS (RQFGPL)
QuantiFERON Mitogen Value: 7.78 IU/mL
QuantiFERON Nil Value: 0.02 IU/mL
QuantiFERON TB1 Ag Value: 0.02 IU/mL
QuantiFERON TB2 Ag Value: 0.02 IU/mL

## 2022-01-29 DIAGNOSIS — E538 Deficiency of other specified B group vitamins: Secondary | ICD-10-CM | POA: Diagnosis not present

## 2022-02-08 ENCOUNTER — Ambulatory Visit (INDEPENDENT_AMBULATORY_CARE_PROVIDER_SITE_OTHER): Payer: Medicare PPO | Admitting: Internal Medicine

## 2022-02-08 VITALS — BP 126/60 | HR 84 | Temp 98.2°F | Ht 66.0 in | Wt 152.2 lb

## 2022-02-08 DIAGNOSIS — K50019 Crohn's disease of small intestine with unspecified complications: Secondary | ICD-10-CM

## 2022-02-08 DIAGNOSIS — K219 Gastro-esophageal reflux disease without esophagitis: Secondary | ICD-10-CM | POA: Diagnosis not present

## 2022-02-08 DIAGNOSIS — B459 Cryptococcosis, unspecified: Secondary | ICD-10-CM

## 2022-02-08 NOTE — Patient Instructions (Addendum)
We will schedule you for colonoscopy to further evaluate your Crohn's disease. ? ?We are likely going to need to escalate your chronic Crohn's medications to biologic therapy.  I would recommend either Remicade or Entyvio.  Both of these are infusions. ? ?I will reach out to infectious disease in regards to your history of cryptococcal pneumonia prior to potentially starting these medications. ? ?Continue on prednisone taper to completion. ? ?Further recommendations to follow. ? ?It was very nice seeing you again today.   ? ?Dr. Abbey Chatters ? ?At [facility] we value your feedback. You may receive a survey about your visit today. Please share your experience as we strive to create trusting relationships with our patients to provide genuine, compassionate, quality care. ? ?We appreciate your understanding and patience as we review any laboratory studies, imaging, and other diagnostic tests that are ordered as we care for you. Our office policy is 5 business days for review of these results, and any emergent or urgent results are addressed in a timely manner for your best interest. If you do not hear from our office in 1 week, please contact us.  ? ?We also encourage the use of MyChart, which contains your medical information for your review as well. If you are not enrolled in this feature, an access code is on this after visit summary for your convenience. Thank you for allowing [place] to be involved in your care. ? ?It was great to see you today!  I hope you have a great rest of your Spring! ? ? ? ?Isabella Scott. Abbey Chatters, D.O. ?Gastroenterology and Hepatology ?[facility] ? ?

## 2022-02-08 NOTE — Progress Notes (Signed)
? ? ?Referring Provider: Manon Hilding, MD ?Primary Care Physician:  Manon Hilding, MD ?Primary GI:  Dr. Abbey Chatters ? ?Chief Complaint  ?Patient presents with  ? Follow-up  ?  Pt following up on her crohns  ? ? ?HPI:   ?Isabella Scott is a 66 y.o. female who presents to the clinic today for hospital follow-up visit.  Recently admitted to [facility] 01/18/2022 after presenting with worsening abdominal pain.  CT abdomen pelvis with contrast showed Distal ileal wall thickening with tethering/spiculation along with submucosal enhancement.  Also noted surrounding mild inflammation and small reactive lymph nodes.  Findings concerning for active Crohn's disease, scarring, underlying neoplasm not excluded.  As well as developing small bowel obstruction.  She was placed on IV steroids and recovered quite quickly.  Discharged home on an 8-week taper of prednisone. ? ?In regards to her Crohn's disease, this was originally diagnosed in 62. Last saw [facility] for Crohn's disease on 08/03/2015 . At that time she was noted to be doing reasonably well from a Crohn's disease standpoint with no abdominal pain or bleeding. No fever, cough, dyspnea, malaise.  Has been maintained on Imuran 100 mg daily as well as prednisone daily 4 to 5 mg.   ? ?Previously on Humira but developed cryptococcus pneumonia and this was discontinued.  [facility] with latest recommendation to do yearly chest x-ray as it appeared to be inactive.  Most recent chest x-ray in our records 07/22/2021 unremarkable besides small right lung base pulmonary nodule which is stable compared to prior exams. ? ?Colonoscopy at 01/28/2017 showed inflammation in the neoterminal ileum.  Biopsy showed active ileitis likely related to her Crohn's disease.  Segmental colon biopsies were all negative. ? ?Colonoscopy 03/24/2013 with a noted 5 cm segment of active Crohn's disease at the ileocolic anastomosis. ? ?Today, she states she is doing  very well.  No longer having abdominal pain.  Having normal bowel movements 1-2 times daily.  No melena hematochezia.  No mucus in her stool. ? ?Does have chronic GERD which is well controlled on pantoprazole 40 mg daily. ? ? ?Past Medical History:  ?Diagnosis Date  ? Anxiety   ? Arthritis   ? Crohn's disease ([place])   ? Dysphagia   ? GERD (gastroesophageal reflux disease)   ? Hypothyroidism   ? 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 ? ?Past Surgical History:  ?Procedure Laterality Date  ? BUNIONECTOMY Left 2000  ? BUNIONECTOMY Right 2005  ? COLONOSCOPY N/A 2010  ? COLONOSCOPY N/A 01/28/2017  ? Procedure: COLONOSCOPY;  Surgeon: Danie Binder, MD;  [facility];  Service: Endoscopy;  Laterality: N/A;  9:30am  ? ESOPHAGOGASTRODUODENOSCOPY N/A 2010  ? with dilation  ? PARTIAL COLECTOMY N/A 1981  ? terminal ileum resection  ? ? ?Current Outpatient Medications  ?Medication Sig Dispense Refill  ? acetaminophen (TYLENOL) 500 MG tablet Take 500-1,000 mg by mouth daily as needed for moderate pain or headache.    ? azaTHIOprine (IMURAN) 50 MG tablet Take 100 mg by mouth daily.    ? Calcium Carb-Cholecalciferol 600-800 MG-UNIT TABS Take 1 tablet by mouth daily.    ? Chlorpheniramine-Phenylephrine (SINUS & ALLERGY PO) Take by mouth.    ? Cholecalciferol (VITAMIN D-3) 25 MCG (1000 UT) CAPS Take 1 capsule by mouth daily.    ? cholestyramine light (PREVALITE) 4 GM/DOSE powder Take 4 g by mouth daily.    ? citalopram (CELEXA) 20 MG  tablet Take 40 mg by mouth daily.  0  ? Cyanocobalamin (VITAMIN B-12 IJ) Inject 1 Dose as directed every 30 (thirty) days.     ? fluticasone (FLONASE) 50 MCG/ACT nasal spray Place 2 sprays into both nostrils daily.    ? levothyroxine (SYNTHROID) 112 MCG tablet Take 1 tablet (112 mcg total) by mouth daily. 90 tablet 1  ? LORazepam (ATIVAN) 0.5 MG tablet Take 0.5 mg by mouth in the morning, at noon, and at bedtime.    ? metoprolol succinate (TOPROL-XL) 50 MG 24 hr tablet Take 1 tablet (50 mg total) by  mouth daily. Take with or immediately following a meal for BP 30 tablet 4  ? Multiple Vitamin (MULTIVITAMIN) capsule Take 1 capsule by mouth daily.    ? pantoprazole (PROTONIX) 40 MG tablet Take 1 tablet (40 mg total) by mouth daily. 90 tablet 3  ? potassium chloride (KLOR-CON) 10 MEQ tablet Take 10 mEq by mouth 2 (two) times daily.    ? predniSONE (DELTASONE) 10 MG tablet Take 1-4 tablets (10-40 mg total) by mouth See admin instructions. Prednisone  40 mg (4 Tab) daily for  2 weeks, 30 mg (3 Tab) daily for  2 weeks, 20 mg (2 Tab) daily for  2 weeks, 10 mg  (1 tab) daily for  2 weeks--- 120 tablet 0  ? Tetrahydrozoline HCl (VISINE OP) Apply 1 drop to eye daily as needed (dry eyes).    ? valACYclovir (VALTREX) 500 MG tablet Take 500 mg by mouth 2 (two) times daily. As needed    ? clobetasol ointment (TEMOVATE) 7.93 % Apply 1 application. topically every evening. (Patient not taking: Reported on 02/08/2022)    ? Naphazoline-Pheniramine (OPCON-A OP) Apply 1 drop to eye daily as needed (allergies). (Patient not taking: Reported on 02/08/2022)    ? ?No current facility-administered medications for this visit.  ? ? ?Allergies as of 02/08/2022 - Review Complete 02/08/2022  ?Allergen Reaction Noted  ? Meperidine  06/12/2011  ? Mirtazapine  05/03/2021  ? Morphine and related  06/12/2011  ? Sulfamethoxazole Itching and Rash 08/13/2012  ? ? ?Family History  ?Problem Relation Age of Onset  ? Heart disease Father   ? Heart disease Brother   ? Kidney disease Brother   ? Throat cancer Maternal Uncle   ? Lung cancer Maternal Grandfather   ? Colon cancer Neg Hx   ? Crohn's disease Neg Hx   ? Ulcerative colitis Neg Hx   ? ? ?Social History  ? ?Socioeconomic History  ? Marital status: Married  ?  Spouse name: Not on file  ? Number of children: Not on file  ? Years of education: Not on file  ? Highest education level: Not on file  ?Occupational History  ? Not on file  ?Tobacco Use  ? Smoking status: Former  ?  Types: Cigarettes  ?  Quit  date: 01/03/1997  ?  Years since quitting: 25.1  ? Smokeless tobacco: Never  ?Vaping Use  ? Vaping Use: Never used  ?Substance and Sexual Activity  ? Alcohol use: No  ? Drug use: No  ? Sexual activity: Not on file  ?Other Topics Concern  ? Not on file  ?Social History Narrative  ? Not on file  ? ?Social Determinants of Health  ? ?Financial Resource Strain: Not on file  ?Food Insecurity: Not on file  ?Transportation Needs: Not on file  ?Physical Activity: Not on file  ?Stress: Not on file  ?Social Connections: Not on file  ? ? ?  Subjective: ?Review of Systems  ?Constitutional:  Negative for chills and fever.  ?HENT:  Negative for congestion and hearing loss.   ?Eyes:  Negative for blurred vision and double vision.  ?Respiratory:  Negative for cough and shortness of breath.   ?Cardiovascular:  Negative for chest pain and palpitations.  ?Gastrointestinal:  Negative for abdominal pain, blood in stool, constipation, diarrhea, heartburn, melena and vomiting.  ?Genitourinary:  Negative for dysuria and urgency.  ?Musculoskeletal:  Negative for joint pain and myalgias.  ?Skin:  Negative for itching and rash.  ?Neurological:  Negative for dizziness and headaches.  ?Psychiatric/Behavioral:  Negative for depression. The patient is not nervous/anxious.   ? ? ?Objective: ?BP 126/60   Pulse 84   Temp 98.2 ?F (36.8 ?C)   Ht 5' 6"  (1.676 m)   Wt 152 lb 3.2 oz (69 kg)   BMI 24.57 kg/m?  ?Physical Exam ?Constitutional:   ?   Appearance: Normal appearance.  ?HENT:  ?   Head: Normocephalic and atraumatic.  ?Eyes:  ?   Extraocular Movements: Extraocular movements intact.  ?   Conjunctiva/sclera: Conjunctivae normal.  ?Cardiovascular:  ?   Rate and Rhythm: Normal rate and regular rhythm.  ?Pulmonary:  ?   Effort: Pulmonary effort is normal.  ?   Breath sounds: Normal breath sounds.  ?Abdominal:  ?   General: Bowel sounds are normal.  ?   Palpations: Abdomen is soft.  ?Musculoskeletal:     ?   General: No swelling. Normal range of  motion.  ?   Cervical back: Normal range of motion and neck supple.  ?Skin: ?   General: Skin is warm and dry.  ?   Coloration: Skin is not jaundiced.  ?Neurological:  ?   General: No focal deficit present.  ?

## 2022-02-12 ENCOUNTER — Telehealth: Payer: Self-pay

## 2022-02-12 NOTE — Telephone Encounter (Signed)
Tried to call pt to schedule TCS w/Propofol ASA 2 w/Dr. Abbey Chatters, [facility] for return call. ?

## 2022-02-13 ENCOUNTER — Telehealth: Payer: Self-pay | Admitting: Internal Medicine

## 2022-02-13 ENCOUNTER — Other Ambulatory Visit: Payer: Self-pay

## 2022-02-13 MED ORDER — PEG 3350-KCL-NA BICARB-NACL 420 G PO SOLR: 4000.0000 mL | ORAL | 0 refills | Status: DC

## 2022-02-13 NOTE — Telephone Encounter (Signed)
Patient returned call

## 2022-02-13 NOTE — Telephone Encounter (Signed)
Called pt, she wasn't home. She will call back when she's home to check her calendar. ?

## 2022-02-13 NOTE — Telephone Encounter (Signed)
See other phone note

## 2022-02-13 NOTE — Telephone Encounter (Signed)
Spoke to pt, TCS scheduled for 03/13/22 at 9:30am. Rx for prep sent to pharmacy. Orders entered. Instructions mailed. ? ?PA for TCS submitted via [facility] website. PA# [id number]. ? ? ?

## 2022-02-19 DIAGNOSIS — K5090 Crohn's disease, unspecified, without complications: Secondary | ICD-10-CM | POA: Diagnosis not present

## 2022-02-27 DIAGNOSIS — E890 Postprocedural hypothyroidism: Secondary | ICD-10-CM | POA: Diagnosis not present

## 2022-02-27 DIAGNOSIS — E0590 Thyrotoxicosis, unspecified without thyrotoxic crisis or storm: Secondary | ICD-10-CM | POA: Diagnosis not present

## 2022-02-27 DIAGNOSIS — K5090 Crohn's disease, unspecified, without complications: Secondary | ICD-10-CM | POA: Diagnosis not present

## 2022-02-27 DIAGNOSIS — I10 Essential (primary) hypertension: Secondary | ICD-10-CM | POA: Diagnosis not present

## 2022-02-27 DIAGNOSIS — K5080 Crohn's disease of both small and large intestine without complications: Secondary | ICD-10-CM | POA: Diagnosis not present

## 2022-02-27 DIAGNOSIS — Z13220 Encounter for screening for lipoid disorders: Secondary | ICD-10-CM | POA: Diagnosis not present

## 2022-02-27 LAB — TSH: TSH: 19.8 — AB (ref 0.41–5.90)

## 2022-02-28 ENCOUNTER — Ambulatory Visit: Payer: Medicare PPO | Admitting: Internal Medicine

## 2022-03-02 DIAGNOSIS — F32A Depression, unspecified: Secondary | ICD-10-CM | POA: Diagnosis not present

## 2022-03-02 DIAGNOSIS — E538 Deficiency of other specified B group vitamins: Secondary | ICD-10-CM | POA: Diagnosis not present

## 2022-03-02 DIAGNOSIS — F419 Anxiety disorder, unspecified: Secondary | ICD-10-CM | POA: Diagnosis not present

## 2022-03-06 ENCOUNTER — Encounter: Payer: Self-pay | Admitting: "Endocrinology

## 2022-03-06 ENCOUNTER — Ambulatory Visit (INDEPENDENT_AMBULATORY_CARE_PROVIDER_SITE_OTHER): Payer: Medicare PPO | Admitting: "Endocrinology

## 2022-03-06 VITALS — BP 114/78 | HR 88 | Ht 66.0 in | Wt 154.2 lb

## 2022-03-06 DIAGNOSIS — E890 Postprocedural hypothyroidism: Secondary | ICD-10-CM

## 2022-03-06 NOTE — Progress Notes (Signed)
? ? ?                                           03/06/2022, 9:05 AM ?   ?                       ?          ?Endocrinology follow-up note ? ? ?Subjective:  ? ? Patient ID: Isabella Scott, female    DOB: 09/02/1956, PCP Sasser, Silvestre Moment, MD ? ? ?Past Medical History:  ?Diagnosis Date  ? Anxiety   ? Arthritis   ? Crohn's disease ([place])   ? Dysphagia   ? GERD (gastroesophageal reflux disease)   ? Hypothyroidism   ? 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 ?Past Surgical History:  ?Procedure Laterality Date  ? BUNIONECTOMY Left 2000  ? BUNIONECTOMY Right 2005  ? COLONOSCOPY N/A 2010  ? COLONOSCOPY N/A 01/28/2017  ? Procedure: COLONOSCOPY;  Surgeon: Danie Binder, MD;  [facility];  Service: Endoscopy;  Laterality: N/A;  9:30am  ? ESOPHAGOGASTRODUODENOSCOPY N/A 2010  ? with dilation  ? PARTIAL COLECTOMY N/A 1981  ? terminal ileum resection  ? ?Social History  ? ?Socioeconomic History  ? Marital status: Married  ?  Spouse name: Not on file  ? Number of children: Not on file  ? Years of education: Not on file  ? Highest education level: Not on file  ?Occupational History  ? Not on file  ?Tobacco Use  ? Smoking status: Former  ?  Types: Cigarettes  ?  Quit date: 01/03/1997  ?  Years since quitting: 25.1  ? Smokeless tobacco: Never  ?Vaping Use  ? Vaping Use: Never used  ?Substance and Sexual Activity  ? Alcohol use: No  ? Drug use: No  ? Sexual activity: Not on file  ?Other Topics Concern  ? Not on file  ?Social History Narrative  ? Not on file  ? ?Social Determinants of Health  ? ?Financial Resource Strain: Not on file  ?Food Insecurity: Not on file  ?Transportation Needs: Not on file  ?Physical Activity: Not on file  ?Stress: Not on file  ?Social Connections: Not on file  ? ?Outpatient Encounter Medications as of 03/06/2022  ?Medication Sig  ? clobetasol ointment (TEMOVATE) 1.75 % Apply 1 application. topically every evening.  ? Naphazoline-Pheniramine (OPCON-A OP) Apply 1 drop to eye daily as needed (allergies).  ?  acetaminophen (TYLENOL) 500 MG tablet Take 500-1,000 mg by mouth daily as needed for moderate pain or headache.  ? azaTHIOprine (IMURAN) 50 MG tablet Take 100 mg by mouth daily.  ? Calcium Carb-Cholecalciferol 600-800 MG-UNIT TABS Take 1 tablet by mouth daily.  ? Chlorpheniramine-Phenylephrine (SINUS & ALLERGY PO) Take by mouth.  ? Cholecalciferol (VITAMIN D-3) 25 MCG (1000 UT) CAPS Take 1 capsule by mouth daily.  ? cholestyramine light (PREVALITE) 4 GM/DOSE powder Take 4 g by mouth daily.  ? citalopram (CELEXA) 20 MG tablet Take 40 mg by mouth daily.  ? Cyanocobalamin (VITAMIN B-12 IJ) Inject 1 Dose as directed every 30 (thirty) days.   ? fluticasone (FLONASE) 50 MCG/ACT nasal spray Place 2 sprays into both nostrils daily.  ? levothyroxine (SYNTHROID) 112 MCG tablet Take 1 tablet (112 mcg total) by mouth daily.  ? LORazepam (ATIVAN) 0.5 MG tablet Take 0.5 mg by mouth in the morning,  at noon, and at bedtime.  ? metoprolol succinate (TOPROL-XL) 50 MG 24 hr tablet Take 1 tablet (50 mg total) by mouth daily. Take with or immediately following a meal for BP  ? Multiple Vitamin (MULTIVITAMIN) capsule Take 1 capsule by mouth daily.  ? pantoprazole (PROTONIX) 40 MG tablet Take 1 tablet (40 mg total) by mouth daily.  ? polyethylene glycol-electrolytes (TRILYTE) 420 g solution Take 4,000 mLs by mouth as directed.  ? potassium chloride (KLOR-CON) 10 MEQ tablet Take 10 mEq by mouth 2 (two) times daily.  ? predniSONE (DELTASONE) 10 MG tablet Take 1-4 tablets (10-40 mg total) by mouth See admin instructions. Prednisone  40 mg (4 Tab) daily for  2 weeks, 30 mg (3 Tab) daily for  2 weeks, 20 mg (2 Tab) daily for  2 weeks, 10 mg  (1 tab) daily for  2 weeks---  ? Tetrahydrozoline HCl (VISINE OP) Apply 1 drop to eye daily as needed (dry eyes).  ? valACYclovir (VALTREX) 500 MG tablet Take 500 mg by mouth 2 (two) times daily. As needed  ? ?No facility-administered encounter medications on file as of 03/06/2022.  ? ?ALLERGIES: ?Allergies   ?Allergen Reactions  ? Meperidine   ?  Funny feeling  ? Mirtazapine   ?  "Makes me feel out of body like"  ? Morphine And Related   ?  Funny feeling  ? Sulfamethoxazole Itching and Rash  ? ? ?VACCINATION STATUS: ?Immunization History  ?Administered Date(s) Administered  ? Influenza-Unspecified 08/12/2018  ? ? ?HPI ?Isabella Scott is 66 y.o. female who is status post  RAI thyroid ablation for Graves' disease in May 2019.  ?-She was subsequently initiated on thyroid hormone replacement.  She received levothyroxine as high as 137 mcg in the past.  However due to unexplained anxiety which she thought was due to her thyroid hormone her dose was lowered for safety and patient satisfaction.  She is currently on 112 mcg of Synthroid before breakfast.  She reports compliance and consistency taking her medications this time.  Her labs are consistent with under replacement in terms of TSH, however free T4 is on target at 1.35.  ?She has steady weight.  She was recently exposed to steroids.  ?She denies palpitations, tremor, heat intolerance. ? ?-She denies dysphagia, shortness of breath, change in her voice.  She denies cold/hot intolerance.  She denies any family history of thyroid cancer.   ? ?Review of Systems ?-Limited as above. ? ? ? ?Objective:  ?  ?BP 114/78   Pulse 88   Ht 5' 6"  (1.676 m)   Wt 154 lb 3.2 oz (69.9 kg)   BMI 24.89 kg/m?   ?Wt Readings from Last 3 Encounters:  ?03/06/22 154 lb 3.2 oz (69.9 kg)  ?02/08/22 152 lb 3.2 oz (69 kg)  ?01/18/22 150 lb 2.1 oz (68.1 kg)  ?  ?Physical Exam ? ?Patient is anxious, tearful. ?CMP  ?   ?Component Value Date/Time  ? NA 139 01/19/2022 0438  ? K 4.0 01/19/2022 0438  ? CL 105 01/19/2022 0438  ? CO2 23 01/19/2022 0438  ? GLUCOSE 92 01/19/2022 0438  ? BUN 10 01/19/2022 0438  ? BUN 6 05/01/2021 0000  ? CREATININE 0.89 01/19/2022 0438  ? CREATININE 0.87 02/26/2020 0805  ? CALCIUM 8.4 (L) 01/19/2022 0438  ? PROT 7.3 01/18/2022 1332  ? ALBUMIN 3.3 (L) 01/18/2022 1332  ? AST  19 01/18/2022 1332  ? ALT 13 01/18/2022 1332  ? ALKPHOS 67 01/18/2022 1332  ?  BILITOT 0.5 01/18/2022 1332  ? GFRNONAA >60 01/19/2022 0438  ? GFRNONAA 71 02/26/2020 0805  ? GFRAA 83 02/26/2020 0805  ? ?Recent Results (from the past 2160 hour(s))  ?CBC with Differential     Status: Abnormal  ? Collection Time: 01/18/22  1:32 PM  ?Result Value Ref Range  ? WBC 11.3 (H) 4.0 - 10.5 K/uL  ? RBC 4.38 3.87 - 5.11 MIL/uL  ? Hemoglobin 13.2 12.0 - 15.0 g/dL  ? HCT 41.2 36.0 - 46.0 %  ? MCV 94.1 80.0 - 100.0 fL  ? MCH 30.1 26.0 - 34.0 pg  ? MCHC 32.0 30.0 - 36.0 g/dL  ? RDW 15.9 (H) 11.5 - 15.5 %  ? Platelets 324 150 - 400 K/uL  ? nRBC 0.0 0.0 - 0.2 %  ? Neutrophils Relative % 84 %  ? Neutro Abs 9.4 (H) 1.7 - 7.7 K/uL  ? Lymphocytes Relative 12 %  ? Lymphs Abs 1.4 0.7 - 4.0 K/uL  ? Monocytes Relative 2 %  ? Monocytes Absolute 0.2 0.1 - 1.0 K/uL  ? Eosinophils Relative 0 %  ? Eosinophils Absolute 0.0 0.0 - 0.5 K/uL  ? Basophils Relative 0 %  ? Basophils Absolute 0.0 0.0 - 0.1 K/uL  ? Immature Granulocytes 2 %  ? Abs Immature Granulocytes 0.24 (H) 0.00 - 0.07 K/uL  ?  Comment: Performed at [facility], [address], [place], [place] [address]  ?Comprehensive metabolic panel     Status: Abnormal  ? Collection Time: 01/18/22  1:32 PM  ?Result Value Ref Range  ? Sodium 135 135 - 145 mmol/L  ? Potassium 4.4 3.5 - 5.1 mmol/L  ? Chloride 99 98 - 111 mmol/L  ? CO2 28 22 - 32 mmol/L  ? Glucose, Bld 110 (H) 70 - 99 mg/dL  ?  Comment: Glucose reference range applies only to samples taken after fasting for at least 8 hours.  ? BUN 9 8 - 23 mg/dL  ? Creatinine, Ser 0.89 0.44 - 1.00 mg/dL  ? Calcium 8.7 (L) 8.9 - 10.3 mg/dL  ? Total Protein 7.3 6.5 - 8.1 g/dL  ? Albumin 3.3 (L) 3.5 - 5.0 g/dL  ? AST 19 15 - 41 U/L  ? ALT 13 0 - 44 U/L  ? Alkaline Phosphatase 67 38 - 126 U/L  ? Total Bilirubin 0.5 0.3 - 1.2 mg/dL  ? GFR, Estimated >60 >60 mL/min  ?  Comment: (NOTE) ?Calculated using the CKD-EPI Creatinine Equation (2021) ?  ? Anion gap 8 5  - 15  ?  Comment: Performed at [facility], [address], [place], [place] [address]  ?Lipase, blood     Status: None  ? Collection Time: 01/18/22  1:32 PM  ?Result Value Ref Range  ? Lipase 27 11 - 51

## 2022-03-07 ENCOUNTER — Other Ambulatory Visit: Payer: Self-pay | Admitting: "Endocrinology

## 2022-03-13 ENCOUNTER — Other Ambulatory Visit: Payer: Self-pay

## 2022-03-13 ENCOUNTER — Ambulatory Visit (HOSPITAL_COMMUNITY)
Admission: RE | Admit: 2022-03-13 | Discharge: 2022-03-13 | Disposition: A | Discharge status: Home / Self Care | Payer: Medicare PPO | Attending: Internal Medicine | Admitting: Internal Medicine

## 2022-03-13 ENCOUNTER — Encounter (HOSPITAL_COMMUNITY)
Admission: RE | Disposition: A | Discharge status: Home / Self Care | Payer: Self-pay | Source: Home / Self Care | Attending: Internal Medicine

## 2022-03-13 ENCOUNTER — Ambulatory Visit (HOSPITAL_COMMUNITY): Payer: Medicare PPO | Admitting: Anesthesiology

## 2022-03-13 ENCOUNTER — Encounter (HOSPITAL_COMMUNITY): Payer: Self-pay

## 2022-03-13 ENCOUNTER — Ambulatory Visit (HOSPITAL_BASED_OUTPATIENT_CLINIC_OR_DEPARTMENT_OTHER): Payer: Medicare PPO | Admitting: Anesthesiology

## 2022-03-13 DIAGNOSIS — K635 Polyp of colon: Secondary | ICD-10-CM

## 2022-03-13 DIAGNOSIS — D124 Benign neoplasm of descending colon: Secondary | ICD-10-CM | POA: Diagnosis not present

## 2022-03-13 DIAGNOSIS — K648 Other hemorrhoids: Secondary | ICD-10-CM | POA: Insufficient documentation

## 2022-03-13 DIAGNOSIS — K6389 Other specified diseases of intestine: Secondary | ICD-10-CM | POA: Diagnosis not present

## 2022-03-13 DIAGNOSIS — D123 Benign neoplasm of transverse colon: Secondary | ICD-10-CM | POA: Insufficient documentation

## 2022-03-13 DIAGNOSIS — E890 Postprocedural hypothyroidism: Secondary | ICD-10-CM | POA: Diagnosis not present

## 2022-03-13 DIAGNOSIS — K50012 Crohn's disease of small intestine with intestinal obstruction: Secondary | ICD-10-CM

## 2022-03-13 DIAGNOSIS — K529 Noninfective gastroenteritis and colitis, unspecified: Secondary | ICD-10-CM | POA: Insufficient documentation

## 2022-03-13 DIAGNOSIS — K624 Stenosis of anus and rectum: Secondary | ICD-10-CM | POA: Diagnosis not present

## 2022-03-13 HISTORY — PX: SUBMUCOSAL TATTOO INJECTION: SHX6856

## 2022-03-13 HISTORY — PX: BIOPSY: SHX5522

## 2022-03-13 HISTORY — PX: COLONOSCOPY WITH PROPOFOL: SHX5780

## 2022-03-13 HISTORY — PX: POLYPECTOMY: SHX149

## 2022-03-13 SURGERY — COLONOSCOPY WITH PROPOFOL
Anesthesia: General

## 2022-03-13 MED ORDER — PROPOFOL 500 MG/50ML IV EMUL
INTRAVENOUS | Status: DC | PRN
  Administered 2022-03-13: 100 ug/kg/min via INTRAVENOUS

## 2022-03-13 MED ORDER — PHENYLEPHRINE HCL (PRESSORS) 10 MG/ML IV SOLN
INTRAVENOUS | Status: DC | PRN
  Administered 2022-03-13 (×4): 80 ug via INTRAVENOUS

## 2022-03-13 MED ORDER — PROPOFOL 10 MG/ML IV BOLUS
INTRAVENOUS | Status: DC | PRN
  Administered 2022-03-13: 150 mg via INTRAVENOUS
  Administered 2022-03-13 (×2): 20 mg via INTRAVENOUS
  Administered 2022-03-13: 10 mg via INTRAVENOUS
  Administered 2022-03-13: 20 mg via INTRAVENOUS

## 2022-03-13 MED ORDER — LACTATED RINGERS IV SOLN: INTRAVENOUS | Status: DC

## 2022-03-13 MED ORDER — SPOT INK MARKER SYRINGE KIT
PACK | SUBMUCOSAL | Status: DC | PRN
  Administered 2022-03-13: 1 mL via SUBMUCOSAL

## 2022-03-13 NOTE — H&P (Signed)
Primary Care Physician:  Manon Hilding, MD ?Primary Gastroenterologist:  Dr. Abbey Chatters ? ?Pre-Procedure History & Physical: ?HPI:  Isabella Scott is a 66 y.o. female is here for a colonoscopy to be performed for crohn's disease.  ? ?Past Medical History:  ?Diagnosis Date  ? Anxiety   ? Arthritis   ? Crohn's disease ([place])   ? Dysphagia   ? GERD (gastroesophageal reflux disease)   ? Hypothyroidism   ? 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 ? ?Past Surgical History:  ?Procedure Laterality Date  ? APPENDECTOMY    ? BUNIONECTOMY Left 2000  ? BUNIONECTOMY Right 2005  ? CHOLECYSTECTOMY    ? COLONOSCOPY N/A 2010  ? COLONOSCOPY N/A 01/28/2017  ? Procedure: COLONOSCOPY;  Surgeon: Danie Binder, MD;  [facility];  Service: Endoscopy;  Laterality: N/A;  9:30am  ? ESOPHAGOGASTRODUODENOSCOPY N/A 2010  ? with dilation  ? PARTIAL COLECTOMY N/A 1981  ? terminal ileum resection  ? ? ?Prior to Admission medications   ?Medication Sig Start Date End Date Taking? Authorizing Provider  ?acetaminophen (TYLENOL) 500 MG tablet Take 500-1,000 mg by mouth daily as needed for moderate pain or headache.   Yes [provider]  ?azaTHIOprine (IMURAN) 50 MG tablet Take 100 mg by mouth daily.   Yes [provider]  ?Calcium Carb-Cholecalciferol 600-800 MG-UNIT TABS Take 1 tablet by mouth daily.   Yes [provider]  ?Chlorpheniramine-Phenylephrine (SINUS & ALLERGY PO) Take 1 tablet by mouth daily as needed (allergies).   Yes [provider]  ?Cholecalciferol (VITAMIN D-3) 25 MCG (1000 UT) CAPS Take 1 capsule by mouth daily.   Yes [provider]  ?cholestyramine light (PREVALITE) 4 GM/DOSE powder Take 4 g by mouth 2 (two) times daily as needed (Bowels). 12/20/21  Yes [provider]  ?citalopram (CELEXA) 20 MG tablet Take 40 mg by mouth daily. 01/22/17  Yes [provider]  ?clobetasol ointment (TEMOVATE) 5.20 % Apply 1 application. topically every evening. 01/09/21  Yes [provider]  ?Cyanocobalamin (VITAMIN B-12 IJ) Inject 1 Dose as directed every 30 (thirty) days.    Yes [provider]  ?fluticasone (FLONASE) 50 MCG/ACT nasal spray Place 2 sprays into both nostrils daily as needed for allergies or rhinitis.   Yes [provider]  ?levothyroxine (SYNTHROID) 112 MCG tablet TAKE 1 TABLET(112 MCG) BY MOUTH DAILY 03/07/22  Yes Nida, Marella Chimes, MD  ?LORazepam (ATIVAN) 0.5 MG tablet Take 0.5 mg by mouth in the morning, at noon, and at bedtime.   Yes [provider]  ?metoprolol succinate (TOPROL-XL) 50 MG 24 hr tablet Take 1 tablet (50 mg total) by mouth daily. Take with or immediately following a meal for BP 01/21/22  Yes Emokpae, Courage, MD  ?Multiple Vitamin (MULTIVITAMIN) capsule Take 1 capsule by mouth daily.   Yes [provider]  ?pantoprazole (PROTONIX) 40 MG tablet Take 1 tablet (40 mg total) by mouth daily. 01/20/22  Yes Emokpae, Courage, MD  ?potassium chloride (KLOR-CON) 10 MEQ tablet Take 10 mEq by mouth 2 (two) times daily. 01/10/22  Yes [provider]  ?predniSONE (DELTASONE) 10 MG tablet Take 1-4 tablets (10-40 mg total) by mouth See admin instructions. Prednisone  40 mg (4 Tab) daily for  2 weeks, 30 mg (3 Tab) daily for  2 weeks, 20 mg (2 Tab) daily for  2 weeks, 10 mg  (1 tab) daily for  2 weeks--- ?Patient taking differently: Take 10 mg by mouth daily with breakfast.  Prednisone  40 mg (4 Tab) daily for  2 weeks, 30 mg (3 Tab) daily for  2 weeks, 20 mg (2 Tab) daily for  2 weeks, 10 mg  (1 tab) daily for  2 weeks--- 01/20/22  Yes Emokpae, Courage, MD  ?Naphazoline-Pheniramine (OPCON-A OP) Apply 1 drop to eye daily as needed (allergies).    [provider]  ?polyethylene glycol-electrolytes (TRILYTE) 420 g solution Take 4,000 mLs by mouth as directed. 02/13/22   Eloise Harman, DO  ?Tetrahydrozoline HCl (VISINE OP) Apply 1 drop to eye daily as needed (dry eyes).    [provider]  ?valACYclovir  (VALTREX) 500 MG tablet Take 500 mg by mouth 2 (two) times daily as needed (cold sores).    [provider]  ? ? ?Allergies as of 02/13/2022 - Review Complete 02/08/2022  ?Allergen Reaction Noted  ? Meperidine  06/12/2011  ? Mirtazapine  05/03/2021  ? Morphine and related  06/12/2011  ? Sulfamethoxazole Itching and Rash 08/13/2012  ? ? ?Family History  ?Problem Relation Age of Onset  ? Heart disease Father   ? Heart disease Brother   ? Kidney disease Brother   ? Throat cancer Maternal Uncle   ? Lung cancer Maternal Grandfather   ? Colon cancer Neg Hx   ? Crohn's disease Neg Hx   ? Ulcerative colitis Neg Hx   ? ? ?Social History  ? ?Socioeconomic History  ? Marital status: Married  ?  Spouse name: Not on file  ? Number of children: Not on file  ? Years of education: Not on file  ? Highest education level: Not on file  ?Occupational History  ? Not on file  ?Tobacco Use  ? Smoking status: Former  ?  Types: Cigarettes  ?  Quit date: 01/03/1997  ?  Years since quitting: 25.2  ? Smokeless tobacco: Never  ?Vaping Use  ? Vaping Use: Never used  ?Substance and Sexual Activity  ? Alcohol use: No  ? Drug use: No  ? Sexual activity: Not on file  ?Other Topics Concern  ? Not on file  ?Social History Narrative  ? Not on file  ? ?Social Determinants of Health  ? ?Financial Resource Strain: Not on file  ?Food Insecurity: Not on file  ?Transportation Needs: Not on file  ?Physical Activity: Not on file  ?Stress: Not on file  ?Social Connections: Not on file  ?Intimate Partner Violence: Not on file  ? ? ?Review of Systems: ?See HPI, otherwise negative ROS ? ?Physical Exam: ?Vital signs in last 24 hours: ?Temp:  [98.2 ?F (36.8 ?C)] 98.2 ?F (36.8 ?C) (05/02 0825) ?Resp:  [15] 15 (05/02 0825) ?BP: (117)/(75) 117/75 (05/02 0825) ?SpO2:  [96 %] 96 % (05/02 0825) ?Weight:  [69.9 kg] 69.9 kg (05/02 0825) ?  ?General:   Alert,  Well-developed, well-nourished, pleasant and cooperative in NAD ?Head:  Normocephalic and atraumatic. ?Eyes:   Sclera clear, no icterus.   Conjunctiva pink. ?Ears:  Normal auditory acuity. ?Nose:  No deformity, discharge,  or lesions. ?Mouth:  No deformity or lesions, dentition normal. ?Neck:  Supple; no masses or thyromegaly. ?Lungs:  Clear throughout to auscultation.   No wheezes, crackles, or rhonchi. No acute distress. ?Heart:  Regular rate and rhythm; no murmurs, clicks, rubs,  or gallops. ?Abdomen:  Soft, nontender and nondistended. No masses, hepatosplenomegaly or hernias noted. Normal bowel sounds, without guarding, and without rebound.   ?Msk:  Symmetrical without gross deformities. Normal posture. ?Extremities:  Without clubbing or  edema. ?Neurologic:  Alert and  oriented x4;  grossly normal neurologically. ?Skin:  Intact without significant lesions or rashes. ?Cervical Nodes:  No significant cervical adenopathy. ?Psych:  Alert and cooperative. Normal mood and affect. ? ?Impression/Plan: ?Isabella Scott is here for a colonoscopy to be performed for crohn's disease.  ? ?The risks of the procedure including infection, bleed, or perforation as well as benefits, limitations, alternatives and imponderables have been reviewed with the patient. Questions have been answered. All parties agreeable. ? ? ?

## 2022-03-13 NOTE — Anesthesia Postprocedure Evaluation (Signed)
Anesthesia Post Note ? ?Patient: Isabella Scott ? ?Procedure(s) Performed: COLONOSCOPY WITH PROPOFOL ?BIOPSY ?SUBMUCOSAL TATTOO INJECTION ?POLYPECTOMY INTESTINAL ? ?Patient location during evaluation: Phase II ?Anesthesia Type: General ?Level of consciousness: awake ?Pain management: pain level controlled ?Vital Signs Assessment: post-procedure vital signs reviewed and stable ?Respiratory status: spontaneous breathing and respiratory function stable ?Cardiovascular status: blood pressure returned to baseline and stable ?Postop Assessment: no headache and no apparent nausea or vomiting ?Anesthetic complications: no ?Comments: Late entry ? ? ?No notable events documented. ? ? ?Last Vitals:  ?Vitals:  ? 03/13/22 0825 03/13/22 1000  ?BP: 117/75 (!) 97/58  ?Pulse:  86  ?Resp: 15 18  ?Temp: 36.8 ?C 36.8 ?C  ?SpO2: 96% 95%  ?  ?Last Pain:  ?Vitals:  ? 03/13/22 1000  ?TempSrc: Oral  ?PainSc: 0-No pain  ? ? ?  ?  ?  ?  ?  ?  ? ?Louann Sjogren ? ? ? ? ?

## 2022-03-13 NOTE — Transfer of Care (Signed)
Immediate Anesthesia Transfer of Care Note ? ?Patient: Isabella Scott ? ?Procedure(s) Performed: COLONOSCOPY WITH PROPOFOL ?BIOPSY ?SUBMUCOSAL TATTOO INJECTION ?POLYPECTOMY INTESTINAL ? ?Patient Location: Endoscopy Unit ? ?Anesthesia Type:General ? ?Level of Consciousness: awake and patient cooperative ? ?Airway & Oxygen Therapy: Patient Spontanous Breathing ? ?Post-op Assessment: Report given to RN and Post -op Vital signs reviewed and stable ? ?Post vital signs: Reviewed and stable ? ?Last Vitals:  ?Vitals Value Taken Time  ?BP 89/46 1002  ?Temp 98.3 1002  ?Pulse 76 1002  ?Resp 24 1002  ?SpO2 94 1002  ? ? ?Last Pain:  ?Vitals:  ? 03/13/22 0927  ?TempSrc:   ?PainSc: 0-No pain  ?   ? ?Patients Stated Pain Goal: 8 (03/13/22 0825) ? ?Complications: No notable events documented. ?

## 2022-03-13 NOTE — Op Note (Signed)
[facility] ?Patient Name: Isabella Scott ?Procedure Date: 03/13/2022 9:22 AM ?MRN: [id number] ?Date of Birth: 1956/02/22 ?Attending MD: Elon Alas. Abbey Chatters , DO ?CSN: [id number] ?Age: 66 ?Admit Type: Outpatient ?Procedure:                Colonoscopy ?Indications:              Disease activity assessment of Crohn's disease of  ?                          the small bowel ?Providers:                Elon Alas. Abbey Chatters, DO, [place] Page, Raphael Gibney,  ?                          Technician ?Referring MD:              ?Medicines:                See the Anesthesia note for documentation of the  ?                          administered medications ?Complications:            No immediate complications. ?Estimated Blood Loss:     Estimated blood loss was minimal. ?Procedure:                Pre-Anesthesia Assessment: ?                          - The anesthesia plan was to use monitored  ?                          anesthesia care (MAC). ?                          After obtaining informed consent, the colonoscope  ?                          was passed under direct vision. Throughout the  ?                          procedure, the patient's blood pressure, pulse, and  ?                          oxygen saturations were monitored continuously. The  ?                          PCF-HQ190L ([id number]) scope was introduced through  ?                          the anus and advanced to the the ileocolonic  ?                          anastomosis. The colonoscopy was performed without  ?                          difficulty. The patient tolerated the procedure  ?  well. The quality of the bowel preparation was  ?                          evaluated using the BBPS [facility] Bowel Preparation  ?                          Scale) with scores of: Right Colon = 3, Transverse  ?                          Colon = 3 and Left Colon = 3 (entire mucosa seen  ?                          well with no residual staining, small fragments of  ?                           stool or opaque liquid). The total BBPS score  ?                          equals 9. ?Scope In: 9:32:12 AM ?Scope Out: 9:53:48 AM ?Total Procedure Duration: 0 hours 21 minutes 36 seconds  ?Findings: ?     The perianal and digital rectal examinations were normal. ?     Non-bleeding internal hemorrhoids were found during endoscopy. ?     A 5 mm polyp was found in the transverse colon. The polyp was sessile.  ?     The polyp was removed with a cold snare. Resection and retrieval were  ?     complete. ?     A 6 mm polyp was found in the descending colon. The polyp was sessile.  ?     The polyp was removed with a cold snare. Resection and retrieval were  ?     complete. ?     A severe stenosis was found at the anastomosis approx 75 cm from anal  ?     verge. Biopsies were taken with a cold forceps for histology. Unable to  ?     advance pediatric colonoscope through stricture. Area distal was  ?     tattooed with an injection of 1 mL of Spot (carbon black). ?     Segmental biopsies were taken every 10 cm with a cold forceps in the  ?     entire colon for histology. ?Impression:               - Non-bleeding internal hemorrhoids. ?                          - One 5 mm polyp in the transverse colon, removed  ?                          with a cold snare. Resected and retrieved. ?                          - One 6 mm polyp in the descending colon, removed  ?                          with a cold snare. Resected  and retrieved. ?                          - Stricture at the colonic anastomosis. Biopsied.  ?                          Tattooed. ?                          - Biopsies were taken with a cold forceps for  ?                          histology in the entire colon. ?Moderate Sedation: ?     Per Anesthesia Care ?Recommendation:           - Patient has a contact number available for  ?                          emergencies. The signs and symptoms of potential  ?                          delayed  complications were discussed with the  ?                          patient. Return to normal activities tomorrow.  ?                          Written discharge instructions were provided to the  ?                          patient. ?                          - Resume previous diet. ?                          - Continue present medications. Add Miralax 17gm  ?                          BID. ?                          - Await pathology results. ?                          - Repeat colonoscopy after studies are complete for  ?                          surveillance based on pathology results. ?                          - Return to GI clinic in 3 months. ?                          - Will likely need surgical resection of stricture.  ?  Will refer to [facility] if patient is agreeable.  ?                          Given her history of cryptococcal pneumonia due to  ?                          biologic therapy in the past, would be better  ?                          served in dedicated IBD clinic going forward. ?Procedure Code(s):        --- Professional --- ?                          [phone], Colonoscopy, flexible; with removal of  ?                          tumor(s), polyp(s), or other lesion(s) by snare  ?                          technique ?                          45381, Colonoscopy, flexible; with directed  ?                          submucosal injection(s), any substance ?                          45380, 59, Colonoscopy, flexible; with biopsy,  ?                          single or multiple ?Diagnosis Code(s):        --- Professional --- ?                          K63.5, Polyp of colon ?                          K64.8, Other hemorrhoids ?                          K50.012, Crohn's disease of small intestine with  ?                          intestinal obstruction ?CPT copyright 2019 American Medical Association. All rights reserved. ?The codes documented in this report are preliminary and upon coder  review may  ?be revised to meet current compliance requirements. ?Elon Alas. Abbey Chatters, DO ?Elon Alas. [place], DO ?03/13/2022 10:06:49 AM ?This report has been signed electronically. ?Number of Addenda: 0 ?

## 2022-03-13 NOTE — Discharge Instructions (Addendum)
?  Colonoscopy ?Discharge Instructions ? ?Read the instructions outlined below and refer to this sheet in the next few weeks. These discharge instructions provide you with general information on caring for yourself after you leave the hospital. Your doctor may also give you specific instructions. While your treatment has been planned according to the most current medical practices available, unavoidable complications occasionally occur.  ? ?ACTIVITY ?You may resume your regular activity, but move at a slower pace for the next 24 hours.  ?Take frequent rest periods for the next 24 hours.  ?Walking will help get rid of the air and reduce the bloated feeling in your belly (abdomen).  ?No driving for 24 hours (because of the medicine (anesthesia) used during the test).   ?Do not sign any important legal documents or operate any machinery for 24 hours (because of the anesthesia used during the test).  ?NUTRITION ?Drink plenty of fluids.  ?You may resume your normal diet as instructed by your doctor.  ?Begin with a light meal and progress to your normal diet. Heavy or fried foods are harder to digest and may make you feel sick to your stomach (nauseated).  ?Avoid alcoholic beverages for 24 hours or as instructed.  ?MEDICATIONS ?You may resume your normal medications unless your doctor tells you otherwise.  ?WHAT YOU CAN EXPECT TODAY ?Some feelings of bloating in the abdomen.  ?Passage of more gas than usual.  ?Spotting of blood in your stool or on the toilet paper.  ?IF YOU HAD POLYPS REMOVED DURING THE COLONOSCOPY: ?No aspirin products for 7 days or as instructed.  ?No alcohol for 7 days or as instructed.  ?Eat a soft diet for the next 24 hours.  ?FINDING OUT THE RESULTS OF YOUR TEST ?Not all test results are available during your visit. If your test results are not back during the visit, make an appointment with your caregiver to find out the results. Do not assume everything is normal if you have not heard from your  caregiver or the medical facility. It is important for you to follow up on all of your test results.  ?SEEK IMMEDIATE MEDICAL ATTENTION IF: ?You have more than a spotting of blood in your stool.  ?Your belly is swollen (abdominal distention).  ?You are nauseated or vomiting.  ?You have a temperature over 101.  ?You have abdominal pain or discomfort that is severe or gets worse throughout the day.  ? ?You have a severe stricture related to your Crohn's disease.  I was unable to advance scope past this region.  I do not think we can treat this with medications.  Unfortunately I think you are going to require surgical resection.  If you are agreeable, I will refer you to tertiary care such as [facility] to discuss further.  I did take extensive biopsies of the area.  We will call you with these results.  I did remove 2 polyps as well. ? ?I hope you have a great rest of your week! ? ?Elon Alas. Abbey Chatters, D.O. ?Gastroenterology and Hepatology ?[facility] ? ? ? ?

## 2022-03-13 NOTE — Anesthesia Preprocedure Evaluation (Signed)
Anesthesia Evaluation  ?Patient identified by MRN, date of birth, ID band ?Patient awake ? ? ? ?Reviewed: ?Allergy & Precautions, H&P , NPO status , Patient's Chart, lab work & pertinent test results, reviewed documented beta blocker date and time  ? ?Airway ?Mallampati: II ? ?TM Distance: >3 FB ?Neck ROM: full ? ? ? Dental ?no notable dental hx. ? ?  ?Pulmonary ?neg pulmonary ROS, former smoker,  ?  ?Pulmonary exam normal ?breath sounds clear to auscultation ? ? ? ? ? ? Cardiovascular ?Exercise Tolerance: Good ?hypertension,  ?Rhythm:regular Rate:Normal ? ? ?  ?Neuro/Psych ?PSYCHIATRIC DISORDERS Anxiety negative neurological ROS ?   ? GI/Hepatic ?Neg liver ROS, GERD  Medicated,  ?Endo/Other  ?Hypothyroidism  ? Renal/GU ?negative Renal ROS  ?negative genitourinary ?  ?Musculoskeletal ? ? Abdominal ?  ?Peds ? Hematology ?negative hematology ROS ?(+)   ?Anesthesia Other Findings ? ? Reproductive/Obstetrics ?negative OB ROS ? ?  ? ? ? ? ? ? ? ? ? ? ? ? ? ?  ?  ? ? ? ? ? ? ? ? ?Anesthesia Physical ?Anesthesia Plan ? ?ASA: 2 ? ?Anesthesia Plan: General  ? ?Post-op Pain Management:   ? ?Induction:  ? ?PONV Risk Score and Plan: Propofol infusion ? ?Airway Management Planned:  ? ?Additional Equipment:  ? ?Intra-op Plan:  ? ?Post-operative Plan:  ? ?Informed Consent: I have reviewed the patients History and Physical, chart, labs and discussed the procedure including the risks, benefits and alternatives for the proposed anesthesia with the patient or authorized representative who has indicated his/her understanding and acceptance.  ? ? ? ?Dental Advisory Given ? ?Plan Discussed with: CRNA ? ?Anesthesia Plan Comments:   ? ? ? ? ? ? ?Anesthesia Quick Evaluation ? ?

## 2022-03-13 NOTE — Anesthesia Procedure Notes (Signed)
Date/Time: 03/13/2022 9:26 AM ?Performed by: Vista Deck, CRNA ?Pre-anesthesia Checklist: Patient identified, Emergency Drugs available, Suction available, Timeout performed and Patient being monitored ?Patient Re-evaluated:Patient Re-evaluated prior to induction ?Oxygen Delivery Method: Nasal Cannula ? ? ? ? ?

## 2022-03-14 LAB — SURGICAL PATHOLOGY

## 2022-03-16 ENCOUNTER — Telehealth: Payer: Self-pay | Admitting: Internal Medicine

## 2022-03-16 DIAGNOSIS — K50019 Crohn's disease of small intestine with unspecified complications: Secondary | ICD-10-CM

## 2022-03-16 NOTE — Addendum Note (Signed)
Addended by: Cheron Every on: 03/16/2022 10:54 AM ? ? Modules accepted: Orders ? ?

## 2022-03-16 NOTE — Telephone Encounter (Signed)
I have sent a few messages to the [facility] clinic liaison, have not heard back. ? ?Can we send an urgent referral to IBD clinic at [facility] for further evaluation.  Diagnosis Crohn's disease with stricture. ? ?Please send my last office visit note, recent colonoscopy report, pathology report. ? ?Thank you ?

## 2022-03-16 NOTE — Telephone Encounter (Signed)
Referral faxed

## 2022-03-20 ENCOUNTER — Encounter (HOSPITAL_COMMUNITY): Payer: Self-pay | Admitting: Internal Medicine

## 2022-03-26 DIAGNOSIS — Z1231 Encounter for screening mammogram for malignant neoplasm of breast: Secondary | ICD-10-CM | POA: Diagnosis not present

## 2022-04-03 DIAGNOSIS — E538 Deficiency of other specified B group vitamins: Secondary | ICD-10-CM | POA: Diagnosis not present

## 2022-04-05 DIAGNOSIS — K50012 Crohn's disease of small intestine with intestinal obstruction: Secondary | ICD-10-CM | POA: Diagnosis not present

## 2022-04-05 DIAGNOSIS — K5000 Crohn's disease of small intestine without complications: Secondary | ICD-10-CM | POA: Diagnosis not present

## 2022-04-05 DIAGNOSIS — B450 Pulmonary cryptococcosis: Secondary | ICD-10-CM | POA: Diagnosis not present

## 2022-04-26 DIAGNOSIS — K50012 Crohn's disease of small intestine with intestinal obstruction: Secondary | ICD-10-CM | POA: Diagnosis not present

## 2022-05-01 DIAGNOSIS — K632 Fistula of intestine: Secondary | ICD-10-CM | POA: Diagnosis not present

## 2022-05-01 DIAGNOSIS — R1310 Dysphagia, unspecified: Secondary | ICD-10-CM | POA: Diagnosis not present

## 2022-05-01 DIAGNOSIS — K50012 Crohn's disease of small intestine with intestinal obstruction: Secondary | ICD-10-CM | POA: Diagnosis not present

## 2022-05-01 DIAGNOSIS — K219 Gastro-esophageal reflux disease without esophagitis: Secondary | ICD-10-CM | POA: Diagnosis not present

## 2022-05-01 DIAGNOSIS — K50013 Crohn's disease of small intestine with fistula: Secondary | ICD-10-CM | POA: Diagnosis not present

## 2022-05-03 DIAGNOSIS — E538 Deficiency of other specified B group vitamins: Secondary | ICD-10-CM | POA: Diagnosis not present

## 2022-05-28 ENCOUNTER — Telehealth: Payer: Self-pay | Admitting: "Endocrinology

## 2022-05-28 ENCOUNTER — Other Ambulatory Visit: Payer: Self-pay | Admitting: "Endocrinology

## 2022-05-28 ENCOUNTER — Encounter: Payer: Self-pay | Admitting: Internal Medicine

## 2022-05-28 DIAGNOSIS — Z01818 Encounter for other preprocedural examination: Secondary | ICD-10-CM | POA: Diagnosis not present

## 2022-05-28 DIAGNOSIS — K50914 Crohn's disease, unspecified, with abscess: Secondary | ICD-10-CM | POA: Diagnosis not present

## 2022-05-28 DIAGNOSIS — K651 Peritoneal abscess: Secondary | ICD-10-CM | POA: Diagnosis not present

## 2022-05-28 DIAGNOSIS — K9189 Other postprocedural complications and disorders of digestive system: Secondary | ICD-10-CM | POA: Diagnosis not present

## 2022-05-28 DIAGNOSIS — G4719 Other hypersomnia: Secondary | ICD-10-CM | POA: Diagnosis not present

## 2022-05-28 DIAGNOSIS — Z9889 Other specified postprocedural states: Secondary | ICD-10-CM | POA: Diagnosis not present

## 2022-05-28 DIAGNOSIS — F32A Depression, unspecified: Secondary | ICD-10-CM | POA: Diagnosis not present

## 2022-05-28 DIAGNOSIS — I10 Essential (primary) hypertension: Secondary | ICD-10-CM | POA: Diagnosis not present

## 2022-05-28 DIAGNOSIS — K567 Ileus, unspecified: Secondary | ICD-10-CM | POA: Diagnosis not present

## 2022-05-28 DIAGNOSIS — B450 Pulmonary cryptococcosis: Secondary | ICD-10-CM | POA: Diagnosis not present

## 2022-05-28 DIAGNOSIS — K50018 Crohn's disease of small intestine with other complication: Secondary | ICD-10-CM | POA: Diagnosis not present

## 2022-05-28 DIAGNOSIS — Z9189 Other specified personal risk factors, not elsewhere classified: Secondary | ICD-10-CM | POA: Diagnosis not present

## 2022-05-28 DIAGNOSIS — E039 Hypothyroidism, unspecified: Secondary | ICD-10-CM | POA: Diagnosis not present

## 2022-05-28 DIAGNOSIS — Z885 Allergy status to narcotic agent status: Secondary | ICD-10-CM | POA: Diagnosis not present

## 2022-05-28 DIAGNOSIS — K50013 Crohn's disease of small intestine with fistula: Secondary | ICD-10-CM | POA: Diagnosis not present

## 2022-05-28 DIAGNOSIS — F419 Anxiety disorder, unspecified: Secondary | ICD-10-CM | POA: Diagnosis not present

## 2022-05-28 DIAGNOSIS — K632 Fistula of intestine: Secondary | ICD-10-CM | POA: Diagnosis not present

## 2022-05-28 DIAGNOSIS — R0683 Snoring: Secondary | ICD-10-CM | POA: Diagnosis not present

## 2022-05-28 DIAGNOSIS — K219 Gastro-esophageal reflux disease without esophagitis: Secondary | ICD-10-CM | POA: Diagnosis not present

## 2022-05-28 MED ORDER — LEVOTHYROXINE SODIUM 125 MCG PO TABS
125.0000 ug | ORAL_TABLET | Freq: Every day | ORAL | 1 refills | Status: DC
Start: 2022-05-28 — End: 2022-05-29

## 2022-05-28 NOTE — Telephone Encounter (Signed)
Patient had labs done today at her surgeon today & they said she needs medication adjustment before they can perform surgery. They are in the chart, please avdise  Call back [phone]

## 2022-05-28 NOTE — Telephone Encounter (Signed)
Left pt a VM on home phone to return call

## 2022-06-05 ENCOUNTER — Ambulatory Visit: Payer: Medicare PPO | Admitting: "Endocrinology

## 2022-06-05 DIAGNOSIS — F32A Depression, unspecified: Secondary | ICD-10-CM | POA: Diagnosis not present

## 2022-06-05 DIAGNOSIS — Z4659 Encounter for fitting and adjustment of other gastrointestinal appliance and device: Secondary | ICD-10-CM | POA: Diagnosis not present

## 2022-06-05 DIAGNOSIS — G8918 Other acute postprocedural pain: Secondary | ICD-10-CM | POA: Diagnosis not present

## 2022-06-05 DIAGNOSIS — K9189 Other postprocedural complications and disorders of digestive system: Secondary | ICD-10-CM | POA: Diagnosis not present

## 2022-06-05 DIAGNOSIS — K50013 Crohn's disease of small intestine with fistula: Secondary | ICD-10-CM | POA: Diagnosis not present

## 2022-06-05 DIAGNOSIS — K50914 Crohn's disease, unspecified, with abscess: Secondary | ICD-10-CM | POA: Diagnosis not present

## 2022-06-05 DIAGNOSIS — I10 Essential (primary) hypertension: Secondary | ICD-10-CM | POA: Diagnosis not present

## 2022-06-05 DIAGNOSIS — Z885 Allergy status to narcotic agent status: Secondary | ICD-10-CM | POA: Diagnosis not present

## 2022-06-05 DIAGNOSIS — K567 Ileus, unspecified: Secondary | ICD-10-CM | POA: Diagnosis not present

## 2022-06-05 DIAGNOSIS — K50812 Crohn's disease of both small and large intestine with intestinal obstruction: Secondary | ICD-10-CM | POA: Diagnosis not present

## 2022-06-05 DIAGNOSIS — K50118 Crohn's disease of large intestine with other complication: Secondary | ICD-10-CM | POA: Diagnosis not present

## 2022-06-05 DIAGNOSIS — E039 Hypothyroidism, unspecified: Secondary | ICD-10-CM | POA: Diagnosis not present

## 2022-06-05 DIAGNOSIS — K50813 Crohn's disease of both small and large intestine with fistula: Secondary | ICD-10-CM | POA: Diagnosis not present

## 2022-06-05 DIAGNOSIS — K651 Peritoneal abscess: Secondary | ICD-10-CM | POA: Diagnosis not present

## 2022-06-05 DIAGNOSIS — Z923 Personal history of irradiation: Secondary | ICD-10-CM | POA: Diagnosis not present

## 2022-06-05 DIAGNOSIS — K630 Abscess of intestine: Secondary | ICD-10-CM | POA: Diagnosis not present

## 2022-06-05 DIAGNOSIS — E890 Postprocedural hypothyroidism: Secondary | ICD-10-CM | POA: Diagnosis not present

## 2022-06-05 DIAGNOSIS — K50918 Crohn's disease, unspecified, with other complication: Secondary | ICD-10-CM | POA: Diagnosis not present

## 2022-06-05 DIAGNOSIS — K50814 Crohn's disease of both small and large intestine with abscess: Secondary | ICD-10-CM | POA: Diagnosis not present

## 2022-06-05 DIAGNOSIS — F419 Anxiety disorder, unspecified: Secondary | ICD-10-CM | POA: Diagnosis not present

## 2022-06-05 DIAGNOSIS — K6389 Other specified diseases of intestine: Secondary | ICD-10-CM | POA: Diagnosis not present

## 2022-06-05 DIAGNOSIS — R55 Syncope and collapse: Secondary | ICD-10-CM | POA: Diagnosis not present

## 2022-06-05 DIAGNOSIS — K219 Gastro-esophageal reflux disease without esophagitis: Secondary | ICD-10-CM | POA: Diagnosis not present

## 2022-06-05 DIAGNOSIS — Z79890 Hormone replacement therapy (postmenopausal): Secondary | ICD-10-CM | POA: Diagnosis not present

## 2022-06-21 ENCOUNTER — Telehealth: Payer: Self-pay | Admitting: "Endocrinology

## 2022-06-21 NOTE — Telephone Encounter (Signed)
Pt called back and said disregard her message , she has a new script at pharmacy

## 2022-06-21 NOTE — Telephone Encounter (Signed)
New message   Pt c/o medication issue:  1. Name of Medication: levothyroxine (SYNTHROID) 125 MCG tablet  2. How are you currently taking this medication (dosage and times per day)? Once a day    3. Are you having a reaction (difficulty breathing--STAT)? No   4. What is your medication issue? Recently in hospital at [facility] for a 2 weeks, Dr. Judi Cong increase synthroid medication to 137 mg,   Dr. Judi Cong did not give her a new prescription the patient did not call the office to follow up on new prescription..   5. Per patient request can Dr. Dorris Fetch call in a new prescription for Synthroid 137 mg.    6. [facility] in [place].

## 2022-06-21 NOTE — Telephone Encounter (Signed)
Noted  

## 2022-06-25 ENCOUNTER — Ambulatory Visit: Payer: Medicare PPO | Admitting: "Endocrinology

## 2022-06-30 DIAGNOSIS — Z933 Colostomy status: Secondary | ICD-10-CM | POA: Diagnosis not present

## 2022-06-30 DIAGNOSIS — Z882 Allergy status to sulfonamides status: Secondary | ICD-10-CM | POA: Diagnosis not present

## 2022-06-30 DIAGNOSIS — Z885 Allergy status to narcotic agent status: Secondary | ICD-10-CM | POA: Diagnosis not present

## 2022-06-30 DIAGNOSIS — R238 Other skin changes: Secondary | ICD-10-CM | POA: Diagnosis not present

## 2022-06-30 DIAGNOSIS — Z7982 Long term (current) use of aspirin: Secondary | ICD-10-CM | POA: Diagnosis not present

## 2022-06-30 DIAGNOSIS — L309 Dermatitis, unspecified: Secondary | ICD-10-CM | POA: Diagnosis not present

## 2022-07-06 ENCOUNTER — Encounter: Payer: Self-pay | Admitting: "Endocrinology

## 2022-07-06 ENCOUNTER — Ambulatory Visit: Payer: Medicare PPO | Admitting: "Endocrinology

## 2022-07-06 VITALS — BP 102/60 | HR 72 | Ht 66.0 in | Wt 140.2 lb

## 2022-07-06 DIAGNOSIS — E890 Postprocedural hypothyroidism: Secondary | ICD-10-CM

## 2022-07-06 MED ORDER — LEVOTHYROXINE SODIUM 125 MCG PO TABS: 125.0000 ug | ORAL_TABLET | Freq: Every day | ORAL | 2 refills | Status: DC

## 2022-07-06 NOTE — Progress Notes (Signed)
07/06/2022, 12:54 PM                                    Endocrinology follow-up note   Subjective:    Patient ID: Isabella Scott, female    DOB: [birth year], PCP Sasser, Silvestre Moment, MD   Past Medical History:  Diagnosis Date   Anxiety    Arthritis    Crohn's disease ([place])    Dysphagia    GERD (gastroesophageal reflux disease)    Hypothyroidism    Pulmonary cryptococcosis [facility])    Past Surgical History:  Procedure Laterality Date   APPENDECTOMY     BIOPSY  03/13/2022   Procedure: BIOPSY;  Surgeon: Eloise Harman, DO;  [facility];  Service: Endoscopy;;   BUNIONECTOMY Left 2000   BUNIONECTOMY Right 2005   CHOLECYSTECTOMY     COLONOSCOPY N/A 2010   COLONOSCOPY N/A 01/28/2017   Procedure: COLONOSCOPY;  Surgeon: Danie Binder, MD;  [facility];  Service: Endoscopy;  Laterality: N/A;  9:30am   COLONOSCOPY WITH PROPOFOL N/A 03/13/2022   Procedure: COLONOSCOPY WITH PROPOFOL;  Surgeon: Eloise Harman, DO;  [facility];  Service: Endoscopy;  Laterality: N/A;  9:30am   ESOPHAGOGASTRODUODENOSCOPY N/A 2010   with dilation   PARTIAL COLECTOMY N/A 1981   terminal ileum resection   POLYPECTOMY  03/13/2022   Procedure: POLYPECTOMY INTESTINAL;  Surgeon: Eloise Harman, DO;  [facility];  Service: Endoscopy;;   SUBMUCOSAL TATTOO INJECTION  03/13/2022   Procedure: SUBMUCOSAL TATTOO INJECTION;  Surgeon: Eloise Harman, DO;  [facility];  Service: Endoscopy;;   Social History   Socioeconomic History   Marital status: Married    Spouse name: Not on file   Number of children: Not on file   Years of education: Not on file   Highest education level: Not on file  Occupational History   Not on file  Tobacco Use   Smoking status: Former    Types: Cigarettes    Quit date: 01/03/1997    Years since quitting: 25.5   Smokeless tobacco: Never  Vaping Use   Vaping Use: Never used   Substance and Sexual Activity   Alcohol use: No   Drug use: No   Sexual activity: Not on file  Other Topics Concern   Not on file  Social History Narrative   Not on file   Social Determinants of Health   Financial Resource Strain: Not on file  Food Insecurity: Not on file  Transportation Needs: Not on file  Physical Activity: Not on file  Stress: Not on file  Social Connections: Not on file   Outpatient Encounter Medications as of 07/06/2022  Medication Sig   acetaminophen (TYLENOL) 500 MG tablet Take 500-1,000 mg by mouth daily as needed for moderate pain or headache.   azaTHIOprine (IMURAN) 50 MG tablet Take 100 mg by mouth daily.   Calcium Carb-Cholecalciferol 600-800 MG-UNIT TABS Take 1 tablet by mouth daily.   Chlorpheniramine-Phenylephrine (SINUS & ALLERGY PO) Take 1 tablet by mouth daily as needed (allergies).   Cholecalciferol (VITAMIN D-3) 25 MCG (1000 UT) CAPS Take  1 capsule by mouth daily.   cholestyramine light (PREVALITE) 4 GM/DOSE powder Take 4 g by mouth 2 (two) times daily as needed (Bowels).   citalopram (CELEXA) 20 MG tablet Take 40 mg by mouth daily.   clobetasol ointment (TEMOVATE) 0.94 % Apply 1 application. topically every evening.   Cyanocobalamin (VITAMIN B-12 IJ) Inject 1 Dose as directed every 30 (thirty) days.    fluticasone (FLONASE) 50 MCG/ACT nasal spray Place 2 sprays into both nostrils daily as needed for allergies or rhinitis.   levothyroxine (SYNTHROID) 125 MCG tablet Take 1 tablet (125 mcg total) by mouth daily.   LORazepam (ATIVAN) 0.5 MG tablet Take 0.5 mg by mouth in the morning, at noon, and at bedtime.   metoprolol succinate (TOPROL-XL) 50 MG 24 hr tablet Take 1 tablet (50 mg total) by mouth daily. Take with or immediately following a meal for BP   Multiple Vitamin (MULTIVITAMIN) capsule Take 1 capsule by mouth daily.   Naphazoline-Pheniramine (OPCON-A OP) Apply 1 drop to eye daily as needed (allergies).   pantoprazole (PROTONIX) 40 MG  tablet Take 1 tablet (40 mg total) by mouth daily.   potassium chloride (KLOR-CON) 10 MEQ tablet Take 10 mEq by mouth 2 (two) times daily.   predniSONE (DELTASONE) 10 MG tablet Take 1-4 tablets (10-40 mg total) by mouth See admin instructions. Prednisone  40 mg (4 Tab) daily for  2 weeks, 30 mg (3 Tab) daily for  2 weeks, 20 mg (2 Tab) daily for  2 weeks, 10 mg  (1 tab) daily for  2 weeks--- (Patient taking differently: Take 10 mg by mouth daily with breakfast. Prednisone  40 mg (4 Tab) daily for  2 weeks, 30 mg (3 Tab) daily for  2 weeks, 20 mg (2 Tab) daily for  2 weeks, 10 mg  (1 tab) daily for  2 weeks---)   Tetrahydrozoline HCl (VISINE OP) Apply 1 drop to eye daily as needed (dry eyes).   valACYclovir (VALTREX) 500 MG tablet Take 500 mg by mouth 2 (two) times daily as needed (cold sores).   [DISCONTINUED] levothyroxine (SYNTHROID) 125 MCG tablet TAKE 1 TABLET(125 MCG) BY MOUTH DAILY BEFORE BREAKFAST   [DISCONTINUED] levothyroxine (SYNTHROID) 137 MCG tablet Take 137 mcg by mouth daily.   No facility-administered encounter medications on file as of 07/06/2022.   ALLERGIES: Allergies  Allergen Reactions   Meperidine     Funny feeling   Mirtazapine     "Makes me feel out of body like"   Morphine And Related     Funny feeling   Sulfamethoxazole Itching and Rash    VACCINATION STATUS: Immunization History  Administered Date(s) Administered   Influenza-Unspecified 08/12/2018    HPI Isabella Scott is 66 y.o. female who is status post  RAI thyroid ablation for Graves' disease in May 2019.  -She was subsequently initiated on thyroid hormone replacement.  During her recent hospitalization, she was found to have high TSH and low normal free T4 which prompted adjustment of her levothyroxine to 137 mcg p.o. daily before breakfast.  Patient presents with significant weight loss related to her recent GI surgery/colostomy.   She denies palpitations, tremor, heat intolerance.  She has chronic  anxiety.  -She denies dysphagia, shortness of breath, change in her voice.  She denies cold/hot intolerance.  She denies any family history of thyroid cancer.    Review of Systems -Limited as above.    Objective:    BP 102/60   Pulse 72   Ht  5' 6"  (1.676 m)   Wt 140 lb 3.2 oz (63.6 kg)   BMI 22.63 kg/m   Wt Readings from Last 3 Encounters:  07/06/22 140 lb 3.2 oz (63.6 kg)  03/13/22 154 lb (69.9 kg)  03/06/22 154 lb 3.2 oz (69.9 kg)    Physical Exam  Patient is anxious, tearful. CMP     Component Value Date/Time   NA 139 01/19/2022 0438   K 4.0 01/19/2022 0438   CL 105 01/19/2022 0438   CO2 23 01/19/2022 0438   GLUCOSE 92 01/19/2022 0438   BUN 10 01/19/2022 0438   BUN 6 05/01/2021 0000   CREATININE 0.89 01/19/2022 0438   CREATININE 0.87 02/26/2020 0805   CALCIUM 8.4 (L) 01/19/2022 0438   PROT 7.3 01/18/2022 1332   ALBUMIN 3.3 (L) 01/18/2022 1332   AST 19 01/18/2022 1332   ALT 13 01/18/2022 1332   ALKPHOS 67 01/18/2022 1332   BILITOT 0.5 01/18/2022 1332   GFRNONAA >60 01/19/2022 0438   GFRNONAA 71 02/26/2020 0805   GFRAA 83 02/26/2020 0805   No results found for this or any previous visit (from the past 2160 hour(s)).    May 16, 2018 labs showed TSH <0.006, free T4 1.74  Her thyroid ultrasound on August 15, 2017 showed right lobe 4.3 cm and left lobe 4.1 cm.  Scattered bilateral small nodules, largest on the right lobe 0.5 cm.  Repeat thyroid ultrasound from February 28, 2018 is unremarkable, no nodular lesions.  No results found for this or any previous visit (from the past 2160 hour(s)).    Assessment & Plan:   1.  RAI induced hypothyroidism  2.  Graves' disease-resolved status post RAI treatment  -In light of her presentation with unintended/significant weight loss, she will need a lower adjustment in her levothyroxine dose.  I discussed and lowered her levothyroxine to 125 mcg p.o. daily before breakfast with plan to repeat treatment.    - We  discussed about the correct intake of her thyroid hormone, on empty stomach at fasting, with water, separated by at least 30 minutes from breakfast and other medications,  and separated by more than 4 hours from calcium, iron, multivitamins, acid reflux medications (PPIs). -Patient is made aware of the fact that thyroid hormone replacement is needed for life, dose to be adjusted by periodic monitoring of thyroid function tests.    - I advised patient to maintain close follow up with Sasser, Silvestre Moment, MD for primary care needs.   I spent 22 minutes in the care of the patient today including review of labs from Thyroid Function, CMP, and other relevant labs ; imaging/biopsy records (current and previous including abstractions from other facilities); face-to-face time discussing  her lab results and symptoms, medications doses, her options of short and long term treatment based on the latest standards of care / guidelines;   and documenting the encounter.  Isabella Scott  participated in the discussions, expressed understanding, and voiced agreement with the above plans.  All questions were answered to her satisfaction. she is encouraged to contact clinic should she have any questions or concerns prior to her return visit.    Follow up plan: Return in about 3 months (around 10/06/2022) for F/U with Pre-visit Labs.   Glade Lloyd, MD [facility] [address] [place], [place] [address] Phone: [phone]  Fax: [phone]     07/06/2022, 12:54 PM  This note was partially dictated with voice recognition software. Similar  sounding words can be transcribed inadequately or may not  be corrected upon review.

## 2022-07-09 ENCOUNTER — Ambulatory Visit: Payer: Medicare PPO | Admitting: "Endocrinology

## 2022-07-09 DIAGNOSIS — Z48815 Encounter for surgical aftercare following surgery on the digestive system: Secondary | ICD-10-CM | POA: Diagnosis not present

## 2022-07-09 DIAGNOSIS — Z932 Ileostomy status: Secondary | ICD-10-CM | POA: Diagnosis not present

## 2022-07-20 DIAGNOSIS — F331 Major depressive disorder, recurrent, moderate: Secondary | ICD-10-CM | POA: Diagnosis not present

## 2022-07-20 DIAGNOSIS — K5080 Crohn's disease of both small and large intestine without complications: Secondary | ICD-10-CM | POA: Diagnosis not present

## 2022-07-20 DIAGNOSIS — F419 Anxiety disorder, unspecified: Secondary | ICD-10-CM | POA: Diagnosis not present

## 2022-07-20 DIAGNOSIS — K50918 Crohn's disease, unspecified, with other complication: Secondary | ICD-10-CM | POA: Diagnosis not present

## 2022-07-20 DIAGNOSIS — F410 Panic disorder [episodic paroxysmal anxiety] without agoraphobia: Secondary | ICD-10-CM | POA: Diagnosis not present

## 2022-07-24 DIAGNOSIS — Z932 Ileostomy status: Secondary | ICD-10-CM | POA: Diagnosis not present

## 2022-07-24 DIAGNOSIS — K5010 Crohn's disease of large intestine without complications: Secondary | ICD-10-CM | POA: Diagnosis not present

## 2022-07-24 DIAGNOSIS — K5090 Crohn's disease, unspecified, without complications: Secondary | ICD-10-CM | POA: Diagnosis not present

## 2022-07-24 DIAGNOSIS — N823 Fistula of vagina to large intestine: Secondary | ICD-10-CM | POA: Diagnosis not present

## 2022-07-24 DIAGNOSIS — Z9889 Other specified postprocedural states: Secondary | ICD-10-CM | POA: Diagnosis not present

## 2022-07-26 ENCOUNTER — Ambulatory Visit: Payer: Medicare PPO | Admitting: Internal Medicine

## 2022-07-31 DIAGNOSIS — B450 Pulmonary cryptococcosis: Secondary | ICD-10-CM | POA: Diagnosis not present

## 2022-07-31 DIAGNOSIS — Z9889 Other specified postprocedural states: Secondary | ICD-10-CM | POA: Diagnosis not present

## 2022-07-31 DIAGNOSIS — K50012 Crohn's disease of small intestine with intestinal obstruction: Secondary | ICD-10-CM | POA: Diagnosis not present

## 2022-07-31 DIAGNOSIS — K50013 Crohn's disease of small intestine with fistula: Secondary | ICD-10-CM | POA: Diagnosis not present

## 2022-07-31 DIAGNOSIS — Z932 Ileostomy status: Secondary | ICD-10-CM | POA: Diagnosis not present

## 2022-08-10 DIAGNOSIS — K50014 Crohn's disease of small intestine with abscess: Secondary | ICD-10-CM | POA: Diagnosis not present

## 2022-08-10 DIAGNOSIS — Z932 Ileostomy status: Secondary | ICD-10-CM | POA: Diagnosis not present

## 2022-08-23 DIAGNOSIS — K50014 Crohn's disease of small intestine with abscess: Secondary | ICD-10-CM | POA: Diagnosis not present

## 2022-08-31 DIAGNOSIS — I951 Orthostatic hypotension: Secondary | ICD-10-CM | POA: Diagnosis not present

## 2022-08-31 DIAGNOSIS — N858 Other specified noninflammatory disorders of uterus: Secondary | ICD-10-CM | POA: Diagnosis not present

## 2022-08-31 DIAGNOSIS — E871 Hypo-osmolality and hyponatremia: Secondary | ICD-10-CM | POA: Diagnosis not present

## 2022-08-31 DIAGNOSIS — Z932 Ileostomy status: Secondary | ICD-10-CM | POA: Diagnosis not present

## 2022-08-31 DIAGNOSIS — K5090 Crohn's disease, unspecified, without complications: Secondary | ICD-10-CM | POA: Diagnosis not present

## 2022-08-31 DIAGNOSIS — R42 Dizziness and giddiness: Secondary | ICD-10-CM | POA: Diagnosis not present

## 2022-08-31 DIAGNOSIS — R000 Tachycardia, unspecified: Secondary | ICD-10-CM | POA: Diagnosis not present

## 2022-08-31 DIAGNOSIS — R197 Diarrhea, unspecified: Secondary | ICD-10-CM | POA: Diagnosis not present

## 2022-08-31 DIAGNOSIS — I700 Atherosclerosis of aorta: Secondary | ICD-10-CM | POA: Diagnosis not present

## 2022-08-31 DIAGNOSIS — R55 Syncope and collapse: Secondary | ICD-10-CM | POA: Diagnosis not present

## 2022-08-31 DIAGNOSIS — E860 Dehydration: Secondary | ICD-10-CM | POA: Diagnosis not present

## 2022-08-31 DIAGNOSIS — G319 Degenerative disease of nervous system, unspecified: Secondary | ICD-10-CM | POA: Diagnosis not present

## 2022-08-31 DIAGNOSIS — N3289 Other specified disorders of bladder: Secondary | ICD-10-CM | POA: Diagnosis not present

## 2022-08-31 DIAGNOSIS — K8020 Calculus of gallbladder without cholecystitis without obstruction: Secondary | ICD-10-CM | POA: Diagnosis not present

## 2022-08-31 DIAGNOSIS — F419 Anxiety disorder, unspecified: Secondary | ICD-10-CM | POA: Diagnosis not present

## 2022-08-31 DIAGNOSIS — I959 Hypotension, unspecified: Secondary | ICD-10-CM | POA: Diagnosis not present

## 2022-08-31 DIAGNOSIS — R7881 Bacteremia: Secondary | ICD-10-CM | POA: Diagnosis not present

## 2022-08-31 DIAGNOSIS — Z933 Colostomy status: Secondary | ICD-10-CM | POA: Diagnosis not present

## 2022-09-01 DIAGNOSIS — Z79899 Other long term (current) drug therapy: Secondary | ICD-10-CM | POA: Diagnosis not present

## 2022-09-01 DIAGNOSIS — K8020 Calculus of gallbladder without cholecystitis without obstruction: Secondary | ICD-10-CM | POA: Diagnosis not present

## 2022-09-01 DIAGNOSIS — I951 Orthostatic hypotension: Secondary | ICD-10-CM | POA: Diagnosis not present

## 2022-09-01 DIAGNOSIS — N858 Other specified noninflammatory disorders of uterus: Secondary | ICD-10-CM | POA: Diagnosis not present

## 2022-09-01 DIAGNOSIS — Z792 Long term (current) use of antibiotics: Secondary | ICD-10-CM | POA: Diagnosis not present

## 2022-09-01 DIAGNOSIS — Z932 Ileostomy status: Secondary | ICD-10-CM | POA: Diagnosis not present

## 2022-09-01 DIAGNOSIS — R197 Diarrhea, unspecified: Secondary | ICD-10-CM | POA: Diagnosis not present

## 2022-09-01 DIAGNOSIS — I700 Atherosclerosis of aorta: Secondary | ICD-10-CM | POA: Diagnosis not present

## 2022-09-01 DIAGNOSIS — D72829 Elevated white blood cell count, unspecified: Secondary | ICD-10-CM | POA: Diagnosis not present

## 2022-09-01 DIAGNOSIS — G319 Degenerative disease of nervous system, unspecified: Secondary | ICD-10-CM | POA: Diagnosis not present

## 2022-09-01 DIAGNOSIS — K5090 Crohn's disease, unspecified, without complications: Secondary | ICD-10-CM | POA: Diagnosis not present

## 2022-09-01 DIAGNOSIS — N3289 Other specified disorders of bladder: Secondary | ICD-10-CM | POA: Diagnosis not present

## 2022-09-01 DIAGNOSIS — R55 Syncope and collapse: Secondary | ICD-10-CM | POA: Diagnosis not present

## 2022-09-01 DIAGNOSIS — F419 Anxiety disorder, unspecified: Secondary | ICD-10-CM | POA: Diagnosis not present

## 2022-09-01 DIAGNOSIS — E860 Dehydration: Secondary | ICD-10-CM | POA: Diagnosis not present

## 2022-09-02 DIAGNOSIS — D72829 Elevated white blood cell count, unspecified: Secondary | ICD-10-CM | POA: Diagnosis not present

## 2022-09-02 DIAGNOSIS — E860 Dehydration: Secondary | ICD-10-CM | POA: Diagnosis not present

## 2022-09-02 DIAGNOSIS — Z932 Ileostomy status: Secondary | ICD-10-CM | POA: Diagnosis not present

## 2022-09-02 DIAGNOSIS — B9689 Other specified bacterial agents as the cause of diseases classified elsewhere: Secondary | ICD-10-CM | POA: Diagnosis not present

## 2022-09-02 DIAGNOSIS — F419 Anxiety disorder, unspecified: Secondary | ICD-10-CM | POA: Diagnosis not present

## 2022-09-02 DIAGNOSIS — K5090 Crohn's disease, unspecified, without complications: Secondary | ICD-10-CM | POA: Diagnosis not present

## 2022-09-02 DIAGNOSIS — Z792 Long term (current) use of antibiotics: Secondary | ICD-10-CM | POA: Diagnosis not present

## 2022-09-02 DIAGNOSIS — I951 Orthostatic hypotension: Secondary | ICD-10-CM | POA: Diagnosis not present

## 2022-09-02 DIAGNOSIS — R7881 Bacteremia: Secondary | ICD-10-CM | POA: Diagnosis not present

## 2022-09-03 DIAGNOSIS — Z79899 Other long term (current) drug therapy: Secondary | ICD-10-CM | POA: Diagnosis not present

## 2022-09-03 DIAGNOSIS — D72829 Elevated white blood cell count, unspecified: Secondary | ICD-10-CM | POA: Diagnosis not present

## 2022-09-03 DIAGNOSIS — R7881 Bacteremia: Secondary | ICD-10-CM | POA: Diagnosis not present

## 2022-09-03 DIAGNOSIS — Z792 Long term (current) use of antibiotics: Secondary | ICD-10-CM | POA: Diagnosis not present

## 2022-09-03 DIAGNOSIS — Z933 Colostomy status: Secondary | ICD-10-CM | POA: Diagnosis not present

## 2022-09-03 DIAGNOSIS — I951 Orthostatic hypotension: Secondary | ICD-10-CM | POA: Diagnosis not present

## 2022-09-03 DIAGNOSIS — K5090 Crohn's disease, unspecified, without complications: Secondary | ICD-10-CM | POA: Diagnosis not present

## 2022-09-03 DIAGNOSIS — Z932 Ileostomy status: Secondary | ICD-10-CM | POA: Diagnosis not present

## 2022-09-03 DIAGNOSIS — F419 Anxiety disorder, unspecified: Secondary | ICD-10-CM | POA: Diagnosis not present

## 2022-09-03 DIAGNOSIS — E871 Hypo-osmolality and hyponatremia: Secondary | ICD-10-CM | POA: Diagnosis not present

## 2022-09-03 DIAGNOSIS — E860 Dehydration: Secondary | ICD-10-CM | POA: Diagnosis not present

## 2022-09-03 DIAGNOSIS — Z7982 Long term (current) use of aspirin: Secondary | ICD-10-CM | POA: Diagnosis not present

## 2022-09-04 DIAGNOSIS — E871 Hypo-osmolality and hyponatremia: Secondary | ICD-10-CM | POA: Diagnosis not present

## 2022-09-04 DIAGNOSIS — E860 Dehydration: Secondary | ICD-10-CM | POA: Diagnosis not present

## 2022-09-04 DIAGNOSIS — I951 Orthostatic hypotension: Secondary | ICD-10-CM | POA: Diagnosis not present

## 2022-09-04 DIAGNOSIS — F419 Anxiety disorder, unspecified: Secondary | ICD-10-CM | POA: Diagnosis not present

## 2022-09-04 DIAGNOSIS — Z7982 Long term (current) use of aspirin: Secondary | ICD-10-CM | POA: Diagnosis not present

## 2022-09-04 DIAGNOSIS — K5090 Crohn's disease, unspecified, without complications: Secondary | ICD-10-CM | POA: Diagnosis not present

## 2022-09-04 DIAGNOSIS — Z932 Ileostomy status: Secondary | ICD-10-CM | POA: Diagnosis not present

## 2022-09-04 DIAGNOSIS — R7881 Bacteremia: Secondary | ICD-10-CM | POA: Diagnosis not present

## 2022-09-04 DIAGNOSIS — K50919 Crohn's disease, unspecified, with unspecified complications: Secondary | ICD-10-CM | POA: Diagnosis not present

## 2022-09-04 DIAGNOSIS — Z933 Colostomy status: Secondary | ICD-10-CM | POA: Diagnosis not present

## 2022-09-07 DIAGNOSIS — Z882 Allergy status to sulfonamides status: Secondary | ICD-10-CM | POA: Diagnosis not present

## 2022-09-07 DIAGNOSIS — E860 Dehydration: Secondary | ICD-10-CM | POA: Diagnosis not present

## 2022-09-07 DIAGNOSIS — Z885 Allergy status to narcotic agent status: Secondary | ICD-10-CM | POA: Diagnosis not present

## 2022-09-07 DIAGNOSIS — Z888 Allergy status to other drugs, medicaments and biological substances status: Secondary | ICD-10-CM | POA: Diagnosis not present

## 2022-09-08 DIAGNOSIS — E039 Hypothyroidism, unspecified: Secondary | ICD-10-CM | POA: Diagnosis not present

## 2022-09-08 DIAGNOSIS — K5080 Crohn's disease of both small and large intestine without complications: Secondary | ICD-10-CM | POA: Diagnosis not present

## 2022-09-08 DIAGNOSIS — R5383 Other fatigue: Secondary | ICD-10-CM | POA: Diagnosis not present

## 2022-09-08 DIAGNOSIS — E539 Vitamin B deficiency, unspecified: Secondary | ICD-10-CM | POA: Diagnosis not present

## 2022-09-09 LAB — BASIC METABOLIC PANEL
BUN: 11 (ref 4–21)
CO2: 18 (ref 13–22)
Chloride: 99 (ref 99–108)
Creatinine: 1.4 — AB (ref 0.5–1.1)
Glucose: 113
Potassium: 4.5 mEq/L (ref 3.5–5.1)
Sodium: 135 — AB (ref 137–147)

## 2022-09-09 LAB — COMPREHENSIVE METABOLIC PANEL
Albumin: 4.5 (ref 3.5–5.0)
Calcium: 10 (ref 8.7–10.7)
Globulin: 3.5

## 2022-09-09 LAB — VITAMIN B12: Vitamin B-12: 1014

## 2022-09-09 LAB — TSH: TSH: 41.9 — AB (ref 0.41–5.90)

## 2022-09-09 LAB — HEPATIC FUNCTION PANEL
ALT: 103 U/L — AB (ref 7–35)
AST: 90 — AB (ref 13–35)
Alkaline Phosphatase: 235 — AB (ref 25–125)
Bilirubin, Total: 0.2

## 2022-09-11 DIAGNOSIS — E860 Dehydration: Secondary | ICD-10-CM | POA: Diagnosis not present

## 2022-09-11 DIAGNOSIS — F419 Anxiety disorder, unspecified: Secondary | ICD-10-CM | POA: Diagnosis not present

## 2022-09-11 DIAGNOSIS — E869 Volume depletion, unspecified: Secondary | ICD-10-CM | POA: Diagnosis not present

## 2022-09-11 DIAGNOSIS — E875 Hyperkalemia: Secondary | ICD-10-CM | POA: Diagnosis not present

## 2022-09-11 DIAGNOSIS — E539 Vitamin B deficiency, unspecified: Secondary | ICD-10-CM | POA: Diagnosis not present

## 2022-09-11 DIAGNOSIS — Z885 Allergy status to narcotic agent status: Secondary | ICD-10-CM | POA: Diagnosis not present

## 2022-09-11 DIAGNOSIS — K5080 Crohn's disease of both small and large intestine without complications: Secondary | ICD-10-CM | POA: Diagnosis not present

## 2022-09-11 DIAGNOSIS — Z933 Colostomy status: Secondary | ICD-10-CM | POA: Diagnosis not present

## 2022-09-11 DIAGNOSIS — R197 Diarrhea, unspecified: Secondary | ICD-10-CM | POA: Diagnosis not present

## 2022-09-11 DIAGNOSIS — R5383 Other fatigue: Secondary | ICD-10-CM | POA: Diagnosis not present

## 2022-09-11 DIAGNOSIS — R030 Elevated blood-pressure reading, without diagnosis of hypertension: Secondary | ICD-10-CM | POA: Diagnosis not present

## 2022-09-11 DIAGNOSIS — N183 Chronic kidney disease, stage 3 unspecified: Secondary | ICD-10-CM | POA: Diagnosis not present

## 2022-09-11 DIAGNOSIS — Z792 Long term (current) use of antibiotics: Secondary | ICD-10-CM | POA: Diagnosis not present

## 2022-09-11 DIAGNOSIS — E039 Hypothyroidism, unspecified: Secondary | ICD-10-CM | POA: Diagnosis not present

## 2022-09-11 DIAGNOSIS — E876 Hypokalemia: Secondary | ICD-10-CM | POA: Diagnosis not present

## 2022-09-11 DIAGNOSIS — Z79899 Other long term (current) drug therapy: Secondary | ICD-10-CM | POA: Diagnosis not present

## 2022-09-11 DIAGNOSIS — L299 Pruritus, unspecified: Secondary | ICD-10-CM | POA: Diagnosis not present

## 2022-09-11 DIAGNOSIS — N179 Acute kidney failure, unspecified: Secondary | ICD-10-CM | POA: Diagnosis not present

## 2022-09-11 DIAGNOSIS — K50018 Crohn's disease of small intestine with other complication: Secondary | ICD-10-CM | POA: Diagnosis not present

## 2022-09-11 DIAGNOSIS — B379 Candidiasis, unspecified: Secondary | ICD-10-CM | POA: Diagnosis not present

## 2022-09-11 DIAGNOSIS — I10 Essential (primary) hypertension: Secondary | ICD-10-CM | POA: Diagnosis not present

## 2022-09-13 DIAGNOSIS — E039 Hypothyroidism, unspecified: Secondary | ICD-10-CM | POA: Diagnosis not present

## 2022-09-13 DIAGNOSIS — K5080 Crohn's disease of both small and large intestine without complications: Secondary | ICD-10-CM | POA: Diagnosis not present

## 2022-09-13 DIAGNOSIS — E539 Vitamin B deficiency, unspecified: Secondary | ICD-10-CM | POA: Diagnosis not present

## 2022-09-13 DIAGNOSIS — R5383 Other fatigue: Secondary | ICD-10-CM | POA: Diagnosis not present

## 2022-09-18 DIAGNOSIS — K50913 Crohn's disease, unspecified, with fistula: Secondary | ICD-10-CM | POA: Diagnosis not present

## 2022-09-18 DIAGNOSIS — Z789 Other specified health status: Secondary | ICD-10-CM | POA: Diagnosis not present

## 2022-09-18 DIAGNOSIS — N179 Acute kidney failure, unspecified: Secondary | ICD-10-CM | POA: Diagnosis not present

## 2022-09-18 DIAGNOSIS — N952 Postmenopausal atrophic vaginitis: Secondary | ICD-10-CM | POA: Diagnosis not present

## 2022-09-18 DIAGNOSIS — E860 Dehydration: Secondary | ICD-10-CM | POA: Diagnosis not present

## 2022-09-18 DIAGNOSIS — L900 Lichen sclerosus et atrophicus: Secondary | ICD-10-CM | POA: Diagnosis not present

## 2022-09-18 DIAGNOSIS — K50918 Crohn's disease, unspecified, with other complication: Secondary | ICD-10-CM | POA: Diagnosis not present

## 2022-09-18 DIAGNOSIS — F419 Anxiety disorder, unspecified: Secondary | ICD-10-CM | POA: Diagnosis not present

## 2022-09-18 DIAGNOSIS — N958 Other specified menopausal and perimenopausal disorders: Secondary | ICD-10-CM | POA: Diagnosis not present

## 2022-09-18 DIAGNOSIS — K5090 Crohn's disease, unspecified, without complications: Secondary | ICD-10-CM | POA: Diagnosis not present

## 2022-09-18 DIAGNOSIS — N941 Unspecified dyspareunia: Secondary | ICD-10-CM | POA: Diagnosis not present

## 2022-09-18 DIAGNOSIS — N823 Fistula of vagina to large intestine: Secondary | ICD-10-CM | POA: Diagnosis not present

## 2022-09-19 DIAGNOSIS — F419 Anxiety disorder, unspecified: Secondary | ICD-10-CM | POA: Diagnosis not present

## 2022-09-19 DIAGNOSIS — N179 Acute kidney failure, unspecified: Secondary | ICD-10-CM | POA: Diagnosis not present

## 2022-09-19 DIAGNOSIS — Z6822 Body mass index (BMI) 22.0-22.9, adult: Secondary | ICD-10-CM | POA: Diagnosis not present

## 2022-09-19 DIAGNOSIS — K50918 Crohn's disease, unspecified, with other complication: Secondary | ICD-10-CM | POA: Diagnosis not present

## 2022-09-19 DIAGNOSIS — E860 Dehydration: Secondary | ICD-10-CM | POA: Diagnosis not present

## 2022-09-24 DIAGNOSIS — Z48815 Encounter for surgical aftercare following surgery on the digestive system: Secondary | ICD-10-CM | POA: Diagnosis not present

## 2022-09-24 DIAGNOSIS — Z932 Ileostomy status: Secondary | ICD-10-CM | POA: Diagnosis not present

## 2022-09-27 DIAGNOSIS — R0602 Shortness of breath: Secondary | ICD-10-CM | POA: Diagnosis not present

## 2022-09-27 DIAGNOSIS — Z7901 Long term (current) use of anticoagulants: Secondary | ICD-10-CM | POA: Diagnosis not present

## 2022-09-27 DIAGNOSIS — I10 Essential (primary) hypertension: Secondary | ICD-10-CM | POA: Diagnosis not present

## 2022-09-27 DIAGNOSIS — R55 Syncope and collapse: Secondary | ICD-10-CM | POA: Diagnosis not present

## 2022-09-27 DIAGNOSIS — R42 Dizziness and giddiness: Secondary | ICD-10-CM | POA: Diagnosis not present

## 2022-09-27 DIAGNOSIS — R531 Weakness: Secondary | ICD-10-CM | POA: Diagnosis not present

## 2022-09-27 DIAGNOSIS — K5090 Crohn's disease, unspecified, without complications: Secondary | ICD-10-CM | POA: Diagnosis not present

## 2022-09-27 DIAGNOSIS — Z932 Ileostomy status: Secondary | ICD-10-CM | POA: Diagnosis not present

## 2022-09-27 DIAGNOSIS — R002 Palpitations: Secondary | ICD-10-CM | POA: Diagnosis not present

## 2022-09-27 DIAGNOSIS — E039 Hypothyroidism, unspecified: Secondary | ICD-10-CM | POA: Diagnosis not present

## 2022-09-27 DIAGNOSIS — Z20822 Contact with and (suspected) exposure to covid-19: Secondary | ICD-10-CM | POA: Diagnosis not present

## 2022-10-07 DIAGNOSIS — E860 Dehydration: Secondary | ICD-10-CM | POA: Diagnosis not present

## 2022-10-07 DIAGNOSIS — Z79899 Other long term (current) drug therapy: Secondary | ICD-10-CM | POA: Diagnosis not present

## 2022-10-07 DIAGNOSIS — F411 Generalized anxiety disorder: Secondary | ICD-10-CM | POA: Diagnosis not present

## 2022-10-07 DIAGNOSIS — R42 Dizziness and giddiness: Secondary | ICD-10-CM | POA: Diagnosis not present

## 2022-10-07 DIAGNOSIS — R079 Chest pain, unspecified: Secondary | ICD-10-CM | POA: Diagnosis not present

## 2022-10-08 DIAGNOSIS — N958 Other specified menopausal and perimenopausal disorders: Secondary | ICD-10-CM | POA: Diagnosis not present

## 2022-10-08 DIAGNOSIS — N823 Fistula of vagina to large intestine: Secondary | ICD-10-CM | POA: Diagnosis not present

## 2022-10-08 DIAGNOSIS — Z8719 Personal history of other diseases of the digestive system: Secondary | ICD-10-CM | POA: Diagnosis not present

## 2022-10-08 DIAGNOSIS — F419 Anxiety disorder, unspecified: Secondary | ICD-10-CM | POA: Diagnosis not present

## 2022-10-08 DIAGNOSIS — L900 Lichen sclerosus et atrophicus: Secondary | ICD-10-CM | POA: Diagnosis not present

## 2022-10-08 DIAGNOSIS — Z01419 Encounter for gynecological examination (general) (routine) without abnormal findings: Secondary | ICD-10-CM | POA: Diagnosis not present

## 2022-10-09 ENCOUNTER — Ambulatory Visit: Payer: Medicare PPO | Admitting: "Endocrinology

## 2022-10-09 ENCOUNTER — Encounter: Payer: Self-pay | Admitting: "Endocrinology

## 2022-10-09 VITALS — BP 116/64 | HR 100 | Ht 66.0 in | Wt 139.6 lb

## 2022-10-09 DIAGNOSIS — E890 Postprocedural hypothyroidism: Secondary | ICD-10-CM | POA: Diagnosis not present

## 2022-10-09 DIAGNOSIS — E0500 Thyrotoxicosis with diffuse goiter without thyrotoxic crisis or storm: Secondary | ICD-10-CM | POA: Diagnosis not present

## 2022-10-09 MED ORDER — LEVOTHYROXINE SODIUM 150 MCG PO TABS: 150.0000 ug | ORAL_TABLET | Freq: Every day | ORAL | 1 refills | Status: DC

## 2022-10-09 NOTE — Progress Notes (Signed)
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[facility])    Past Surgical History:  Procedure Laterality Date   APPENDECTOMY     BIOPSY  03/13/2022   Procedure: BIOPSY;  Surgeon: Eloise Harman, DO;  [facility];  Service: Endoscopy;;   BUNIONECTOMY Left 2000   BUNIONECTOMY Right 2005   CHOLECYSTECTOMY     COLONOSCOPY N/A 2010   COLONOSCOPY N/A 01/28/2017   Procedure: COLONOSCOPY;  Surgeon: Danie Binder, MD;  [facility];  Service: Endoscopy;  Laterality: N/A;  9:30am   COLONOSCOPY WITH PROPOFOL N/A 03/13/2022   Procedure: COLONOSCOPY WITH PROPOFOL;  Surgeon: Eloise Harman, DO;  [facility];  Service: Endoscopy;  Laterality: N/A;  9:30am   ESOPHAGOGASTRODUODENOSCOPY N/A 2010   with dilation   PARTIAL COLECTOMY N/A 1981   terminal ileum resection   POLYPECTOMY  03/13/2022   Procedure: POLYPECTOMY INTESTINAL;  Surgeon: Eloise Harman, DO;  [facility];  Service: Endoscopy;;   SUBMUCOSAL TATTOO INJECTION  03/13/2022   Procedure: SUBMUCOSAL TATTOO INJECTION;  Surgeon: Eloise Harman, DO;  [facility];  Service: Endoscopy;;   Social History   Socioeconomic History   Marital status: Married    Spouse name: Not on file   Number of children: Not on file   Years of education: Not on file   Highest education level: Not on file  Occupational History   Not on file  Tobacco Use   Smoking status: Former    Types: Cigarettes    Quit date: 01/03/1997    Years since quitting: 25.7   Smokeless tobacco: Never  Vaping Use   Vaping Use: Never used   Substance and Sexual Activity   Alcohol use: No   Drug use: No   Sexual activity: Not on file  Other Topics Concern   Not on file  Social History Narrative   Not on file   Social Determinants of Health   Financial Resource Strain: Not on file  Food Insecurity: Not on file  Transportation Needs: Not on file  Physical Activity: Not on file  Stress: Not on file  Social Connections: Not on file   Outpatient Encounter Medications as of 10/09/2022  Medication Sig   acetaminophen (TYLENOL) 500 MG tablet Take 500-1,000 mg by mouth daily as needed for moderate pain or headache.   azaTHIOprine (IMURAN) 50 MG tablet Take 100 mg by mouth daily.   Calcium Carb-Cholecalciferol 600-800 MG-UNIT TABS Take 1 tablet by mouth daily.   Chlorpheniramine-Phenylephrine (SINUS & ALLERGY PO) Take 1 tablet by mouth daily as needed (allergies).   Cholecalciferol (VITAMIN D-3) 25 MCG (1000 UT) CAPS Take  1 capsule by mouth daily.   cholestyramine light (PREVALITE) 4 GM/DOSE powder Take 4 g by mouth 2 (two) times daily as needed (Bowels).   citalopram (CELEXA) 20 MG tablet Take 40 mg by mouth daily.   clobetasol ointment (TEMOVATE) 3.38 % Apply 1 application. topically every evening.   fluticasone (FLONASE) 50 MCG/ACT nasal spray Place 2 sprays into both nostrils daily as needed for allergies or rhinitis.   levothyroxine (SYNTHROID) 150 MCG tablet Take 1 tablet (150 mcg total) by mouth daily before breakfast.   LORazepam (ATIVAN) 0.5 MG tablet Take 0.5 mg by mouth in the morning, at noon, and at bedtime.   Multiple Vitamin (MULTIVITAMIN) capsule Take 1 capsule by mouth daily.   Naphazoline-Pheniramine (OPCON-A OP) Apply 1 drop to eye daily as needed (allergies).   pantoprazole (PROTONIX) 40 MG tablet Take 1 tablet (40 mg total) by mouth daily.   Tetrahydrozoline HCl (VISINE OP) Apply 1 drop to eye daily as needed (dry eyes).   [DISCONTINUED] Cyanocobalamin (VITAMIN B-12 IJ) Inject 1 Dose as directed every 30  (thirty) days.    [DISCONTINUED] levothyroxine (SYNTHROID) 125 MCG tablet Take 1 tablet (125 mcg total) by mouth daily.   [DISCONTINUED] metoprolol succinate (TOPROL-XL) 50 MG 24 hr tablet Take 1 tablet (50 mg total) by mouth daily. Take with or immediately following a meal for BP   [DISCONTINUED] potassium chloride (KLOR-CON) 10 MEQ tablet Take 10 mEq by mouth 2 (two) times daily.   [DISCONTINUED] predniSONE (DELTASONE) 10 MG tablet Take 1-4 tablets (10-40 mg total) by mouth See admin instructions. Prednisone  40 mg (4 Tab) daily for  2 weeks, 30 mg (3 Tab) daily for  2 weeks, 20 mg (2 Tab) daily for  2 weeks, 10 mg  (1 tab) daily for  2 weeks--- (Patient taking differently: Take 10 mg by mouth daily with breakfast. Prednisone  40 mg (4 Tab) daily for  2 weeks, 30 mg (3 Tab) daily for  2 weeks, 20 mg (2 Tab) daily for  2 weeks, 10 mg  (1 tab) daily for  2 weeks---)   [DISCONTINUED] valACYclovir (VALTREX) 500 MG tablet Take 500 mg by mouth 2 (two) times daily as needed (cold sores).   No facility-administered encounter medications on file as of 10/09/2022.   ALLERGIES: Allergies  Allergen Reactions   Meperidine     Funny feeling   Mirtazapine     "Makes me feel out of body like"   Morphine And Related     Funny feeling   Sulfamethoxazole Itching and Rash    VACCINATION STATUS: Immunization History  Administered Date(s) Administered   Influenza-Unspecified 08/12/2018    HPI Isabella Scott is 66 y.o. female who is status post  RAI thyroid ablation for Graves' disease in May 2019.  -She was subsequently initiated on thyroid hormone replacement.  Her previsit thyroid function tests are consistent with under replacement.   She is still has colostomy, awaiting reversal surgery next week.  She denies palpitations, tremor, heat intolerance.  She has chronic anxiety.  -She denies dysphagia, shortness of breath, change in her voice.  She denies cold/hot intolerance.  She denies any family  history of thyroid cancer.    Review of Systems -Limited as above.    Objective:    BP 116/64   Pulse 100   Ht 5' 6"  (1.676 m)   Wt 139 lb 9.6 oz (63.3 kg)   BMI 22.53 kg/m   Wt Readings from Last 3 Encounters:  10/09/22  139 lb 9.6 oz (63.3 kg)  07/06/22 140 lb 3.2 oz (63.6 kg)  03/13/22 154 lb (69.9 kg)    Physical Exam  Patient is anxious, tearful. CMP     Component Value Date/Time   NA 135 (A) 09/09/2022 0000   K 4.5 09/09/2022 0000   CL 99 09/09/2022 0000   CO2 18 09/09/2022 0000   GLUCOSE 92 01/19/2022 0438   BUN 11 09/09/2022 0000   CREATININE 1.4 (A) 09/09/2022 0000   CREATININE 0.89 01/19/2022 0438   CREATININE 0.87 02/26/2020 0805   CALCIUM 10.0 09/09/2022 0000   PROT 7.3 01/18/2022 1332   ALBUMIN 4.5 09/09/2022 0000   AST 90 (A) 09/09/2022 0000   ALT 103 (A) 09/09/2022 0000   ALKPHOS 235 (A) 09/09/2022 0000   BILITOT 0.5 01/18/2022 1332   GFRNONAA >60 01/19/2022 0438   GFRNONAA 71 02/26/2020 0805   GFRAA 83 02/26/2020 0805   Recent Results (from the past 2160 hour(s))  Basic metabolic panel     Status: Abnormal   Collection Time: 09/09/22 12:00 AM  Result Value Ref Range   Glucose 113    BUN 11 4 - 21   CO2 18 13 - 22   Creatinine 1.4 (A) 0.5 - 1.1   Potassium 4.5 3.5 - 5.1 mEq/L   Sodium 135 (A) 137 - 147   Chloride 99 99 - 108  Comprehensive metabolic panel     Status: None   Collection Time: 09/09/22 12:00 AM  Result Value Ref Range   Globulin 3.5    Calcium 10.0 8.7 - 10.7   Albumin 4.5 3.5 - 5.0  Hepatic function panel     Status: Abnormal   Collection Time: 09/09/22 12:00 AM  Result Value Ref Range   Alkaline Phosphatase 235 (A) 25 - 125   ALT 103 (A) 7 - 35 U/L   AST 90 (A) 13 - 35   Bilirubin, Total 0.2   Vitamin B12     Status: None   Collection Time: 09/09/22 12:00 AM  Result Value Ref Range   Vitamin B-12 1,014   TSH     Status: Abnormal   Collection Time: 09/09/22 12:00 AM  Result Value Ref Range   TSH 41.90 (A) 0.41 -  5.90    Comment: T4,Free 0.88      May 16, 2018 labs showed TSH <0.006, free T4 1.74  Her thyroid ultrasound on August 15, 2017 showed right lobe 4.3 cm and left lobe 4.1 cm.  Scattered bilateral small nodules, largest on the right lobe 0.5 cm.  Repeat thyroid ultrasound from February 28, 2018 is unremarkable, no nodular lesions.  Recent Results (from the past 2160 hour(s))  Basic metabolic panel     Status: Abnormal   Collection Time: 09/09/22 12:00 AM  Result Value Ref Range   Glucose 113    BUN 11 4 - 21   CO2 18 13 - 22   Creatinine 1.4 (A) 0.5 - 1.1   Potassium 4.5 3.5 - 5.1 mEq/L   Sodium 135 (A) 137 - 147   Chloride 99 99 - 108  Comprehensive metabolic panel     Status: None   Collection Time: 09/09/22 12:00 AM  Result Value Ref Range   Globulin 3.5    Calcium 10.0 8.7 - 10.7   Albumin 4.5 3.5 - 5.0  Hepatic function panel     Status: Abnormal   Collection Time: 09/09/22 12:00 AM  Result Value Ref Range   Alkaline Phosphatase  235 (A) 25 - 125   ALT 103 (A) 7 - 35 U/L   AST 90 (A) 13 - 35   Bilirubin, Total 0.2   Vitamin B12     Status: None   Collection Time: 09/09/22 12:00 AM  Result Value Ref Range   Vitamin B-12 1,014   TSH     Status: Abnormal   Collection Time: 09/09/22 12:00 AM  Result Value Ref Range   TSH 41.90 (A) 0.41 - 5.90    Comment: T4,Free 0.88      Assessment & Plan:   1.  RAI induced hypothyroidism  2.  Graves' disease-resolved status post RAI treatment  Her previsit thyroid function tests are consistent with under replacement.  I discussed and increased her levothyroxine to 150 mcg p.o. daily before breakfast.    I  - We discussed about the correct intake of her thyroid hormone, on empty stomach at fasting, with water, separated by at least 30 minutes from breakfast and other medications,  and separated by more than 4 hours from calcium, iron, multivitamins, acid reflux medications (PPIs). -Patient is made aware of the fact that thyroid  hormone replacement is needed for life, dose to be adjusted by periodic monitoring of thyroid function tests.  Patient likely due to her colostomy making it difficult to absorb her appropriate dose.  - I advised patient to maintain close follow up with Sasser, Silvestre Moment, MD for primary care needs.   I spent 21 minutes in the care of the patient today including review of labs from Thyroid Function, CMP, and other relevant labs ; imaging/biopsy records (current and previous including abstractions from other facilities); face-to-face time discussing  her lab results and symptoms, medications doses, her options of short and long term treatment based on the latest standards of care / guidelines;   and documenting the encounter.  Isabella Scott  participated in the discussions, expressed understanding, and voiced agreement with the above plans.  All questions were answered to her satisfaction. she is encouraged to contact clinic should she have any questions or concerns prior to her return visit.    Follow up plan: Return in about 3 months (around 01/09/2023) for F/U with Pre-visit Labs.   Glade Lloyd, MD [facility] [address] [place], [place] [address] Phone: [phone]  Fax: [phone]     10/09/2022, 12:41 PM  This note was partially dictated with voice recognition software. Similar sounding words can be transcribed inadequately or may not  be corrected upon review.

## 2022-10-14 DIAGNOSIS — K50918 Crohn's disease, unspecified, with other complication: Secondary | ICD-10-CM | POA: Diagnosis not present

## 2022-10-14 DIAGNOSIS — N179 Acute kidney failure, unspecified: Secondary | ICD-10-CM | POA: Diagnosis not present

## 2022-10-14 DIAGNOSIS — E860 Dehydration: Secondary | ICD-10-CM | POA: Diagnosis not present

## 2022-10-14 DIAGNOSIS — Z6822 Body mass index (BMI) 22.0-22.9, adult: Secondary | ICD-10-CM | POA: Diagnosis not present

## 2022-10-14 DIAGNOSIS — F419 Anxiety disorder, unspecified: Secondary | ICD-10-CM | POA: Diagnosis not present

## 2022-10-16 ENCOUNTER — Telehealth: Payer: Self-pay | Admitting: "Endocrinology

## 2022-10-16 NOTE — Telephone Encounter (Signed)
Dr Dorris Fetch, Patient left a voicemail stating that she has been dizzy/lightheaded since you increased her Levothyroxine. Please advise

## 2022-10-17 NOTE — Telephone Encounter (Signed)
Spoke with pt, she states she was experiencing these symptoms prior to the increase in thyroid medication. States she does not want to decrease dose she would like to continue at 124mg daily. She was unable to have her surgery due to her thyroid function results.

## 2022-10-18 DIAGNOSIS — Z6822 Body mass index (BMI) 22.0-22.9, adult: Secondary | ICD-10-CM | POA: Diagnosis not present

## 2022-10-18 DIAGNOSIS — J309 Allergic rhinitis, unspecified: Secondary | ICD-10-CM | POA: Diagnosis not present

## 2022-10-18 DIAGNOSIS — R002 Palpitations: Secondary | ICD-10-CM | POA: Diagnosis not present

## 2022-10-18 DIAGNOSIS — R030 Elevated blood-pressure reading, without diagnosis of hypertension: Secondary | ICD-10-CM | POA: Diagnosis not present

## 2022-10-18 DIAGNOSIS — E039 Hypothyroidism, unspecified: Secondary | ICD-10-CM | POA: Diagnosis not present

## 2022-10-18 DIAGNOSIS — R519 Headache, unspecified: Secondary | ICD-10-CM | POA: Diagnosis not present

## 2022-10-19 DIAGNOSIS — R42 Dizziness and giddiness: Secondary | ICD-10-CM | POA: Diagnosis not present

## 2022-10-19 DIAGNOSIS — R0981 Nasal congestion: Secondary | ICD-10-CM | POA: Diagnosis not present

## 2022-10-19 DIAGNOSIS — I10 Essential (primary) hypertension: Secondary | ICD-10-CM | POA: Diagnosis not present

## 2022-10-19 DIAGNOSIS — Z888 Allergy status to other drugs, medicaments and biological substances status: Secondary | ICD-10-CM | POA: Diagnosis not present

## 2022-10-19 DIAGNOSIS — Z885 Allergy status to narcotic agent status: Secondary | ICD-10-CM | POA: Diagnosis not present

## 2022-10-19 DIAGNOSIS — F419 Anxiety disorder, unspecified: Secondary | ICD-10-CM | POA: Diagnosis not present

## 2022-10-19 DIAGNOSIS — R000 Tachycardia, unspecified: Secondary | ICD-10-CM | POA: Diagnosis not present

## 2022-10-19 DIAGNOSIS — Z882 Allergy status to sulfonamides status: Secondary | ICD-10-CM | POA: Diagnosis not present

## 2022-10-19 DIAGNOSIS — I480 Paroxysmal atrial fibrillation: Secondary | ICD-10-CM | POA: Diagnosis not present

## 2022-10-19 DIAGNOSIS — Z79899 Other long term (current) drug therapy: Secondary | ICD-10-CM | POA: Diagnosis not present

## 2022-10-19 DIAGNOSIS — R0989 Other specified symptoms and signs involving the circulatory and respiratory systems: Secondary | ICD-10-CM | POA: Diagnosis not present

## 2022-10-26 ENCOUNTER — Ambulatory Visit: Payer: Medicare PPO | Admitting: "Endocrinology

## 2022-10-26 ENCOUNTER — Encounter: Payer: Self-pay | Admitting: "Endocrinology

## 2022-10-26 VITALS — BP 148/84 | HR 104 | Ht 66.0 in | Wt 140.8 lb

## 2022-10-26 DIAGNOSIS — E890 Postprocedural hypothyroidism: Secondary | ICD-10-CM

## 2022-10-26 MED ORDER — LEVOTHYROXINE SODIUM 100 MCG PO TABS: 100.0000 ug | ORAL_TABLET | Freq: Every day | ORAL | 2 refills | Status: AC

## 2022-10-26 NOTE — Progress Notes (Unsigned)
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[facility])    Past Surgical History:  Procedure Laterality Date   APPENDECTOMY     BIOPSY  03/13/2022   Procedure: BIOPSY;  Surgeon: Eloise Harman, DO;  [facility];  Service: Endoscopy;;   BUNIONECTOMY Left 2000   BUNIONECTOMY Right 2005   CHOLECYSTECTOMY     COLONOSCOPY N/A 2010   COLONOSCOPY N/A 01/28/2017   Procedure: COLONOSCOPY;  Surgeon: Danie Binder, MD;  [facility];  Service: Endoscopy;  Laterality: N/A;  9:30am   COLONOSCOPY WITH PROPOFOL N/A 03/13/2022   Procedure: COLONOSCOPY WITH PROPOFOL;  Surgeon: Eloise Harman, DO;  [facility];  Service: Endoscopy;  Laterality: N/A;  9:30am   ESOPHAGOGASTRODUODENOSCOPY N/A 2010   with dilation   PARTIAL COLECTOMY N/A 1981   terminal ileum resection   POLYPECTOMY  03/13/2022   Procedure: POLYPECTOMY INTESTINAL;  Surgeon: Eloise Harman, DO;  [facility];  Service: Endoscopy;;   SUBMUCOSAL TATTOO INJECTION  03/13/2022   Procedure: SUBMUCOSAL TATTOO INJECTION;  Surgeon: Eloise Harman, DO;  [facility];  Service: Endoscopy;;   Social History   Socioeconomic History   Marital status: Married    Spouse name: Not on file   Number of children: Not on file   Years of education: Not on file   Highest education level: Not on file  Occupational History   Not on file  Tobacco Use   Smoking status: Former    Types: Cigarettes    Quit date: 01/03/1997    Years since quitting: 25.8   Smokeless tobacco: Never  Vaping Use   Vaping Use: Never used   Substance and Sexual Activity   Alcohol use: No   Drug use: No   Sexual activity: Not on file  Other Topics Concern   Not on file  Social History Narrative   Not on file   Social Determinants of Health   Financial Resource Strain: Not on file  Food Insecurity: Not on file  Transportation Needs: Not on file  Physical Activity: Not on file  Stress: Not on file  Social Connections: Not on file   Outpatient Encounter Medications as of 10/26/2022  Medication Sig   acetaminophen (TYLENOL) 500 MG tablet Take 500-1,000 mg by mouth daily as needed for moderate pain or headache.   azaTHIOprine (IMURAN) 50 MG tablet Take 100 mg by mouth daily.   Calcium Carb-Cholecalciferol 600-800 MG-UNIT TABS Take 1 tablet by mouth daily.   Chlorpheniramine-Phenylephrine (SINUS & ALLERGY PO) Take 1 tablet by mouth daily as needed (allergies).   Cholecalciferol (VITAMIN D-3) 25 MCG (1000 UT) CAPS Take  1 capsule by mouth daily.   cholestyramine light (PREVALITE) 4 GM/DOSE powder Take 4 g by mouth 2 (two) times daily as needed (Bowels).   citalopram (CELEXA) 20 MG tablet Take 40 mg by mouth daily.   clobetasol ointment (TEMOVATE) 9.92 % Apply 1 application. topically every evening.   fluticasone (FLONASE) 50 MCG/ACT nasal spray Place 2 sprays into both nostrils daily as needed for allergies or rhinitis.   levothyroxine (SYNTHROID) 100 MCG tablet Take 1 tablet (100 mcg total) by mouth daily before breakfast.   LORazepam (ATIVAN) 0.5 MG tablet Take 0.5 mg by mouth in the morning, at noon, and at bedtime.   Multiple Vitamin (MULTIVITAMIN) capsule Take 1 capsule by mouth daily.   Naphazoline-Pheniramine (OPCON-A OP) Apply 1 drop to eye daily as needed (allergies).   pantoprazole (PROTONIX) 40 MG tablet Take 1 tablet (40 mg total) by mouth daily.   Tetrahydrozoline HCl (VISINE OP) Apply 1 drop to eye daily as needed (dry eyes).   [DISCONTINUED] levothyroxine (SYNTHROID) 150 MCG tablet Take 1 tablet (150 mcg  total) by mouth daily before breakfast.   No facility-administered encounter medications on file as of 10/26/2022.   ALLERGIES: Allergies  Allergen Reactions   Meperidine     Funny feeling   Mirtazapine     "Makes me feel out of body like"   Morphine And Related     Funny feeling   Sulfamethoxazole Itching and Rash    VACCINATION STATUS: Immunization History  Administered Date(s) Administered   Influenza-Unspecified 08/12/2018    HPI Isabella Scott is 66 y.o. female who is status post  RAI thyroid ablation for Graves' disease in May 2019.  She presents today with her husband before her  scheduled appointment due to palpitations. During her last visit, her levothyroxine was increased to 150 mcg p.o. daily before breakfast.  This was due to the fact that she was losing a portion of her dose through the colostomy. She is scheduled to have colostomy reversal surgery. In the interim, she called for palpitations, advised to lower her dose to 125, however did not follow through with this instruction.  She remains on her levothyroxine 150 mcg p.o. daily. Her most recent thyroid function tests are still consistent with under replacement.   She has chronic anxiety.  Her husband is offering help to her.    -She denies dysphagia, shortness of breath, change in her voice.  She denies cold/hot intolerance.  She denies any family history of thyroid cancer.    Review of Systems -Limited as above.    Objective:    BP (!) 148/84 Comment: pt has not taken her BP medication today  Pulse (!) 104   Ht 5' 6"  (1.676 m)   Wt 140 lb 12.8 oz (63.9 kg)   BMI 22.73 kg/m   Wt Readings from Last 3 Encounters:  10/26/22 140 lb 12.8 oz (63.9 kg)  10/09/22 139 lb 9.6 oz (63.3 kg)  07/06/22 140 lb 3.2 oz (63.6 kg)    Physical Exam  Patient is anxious, tearful. CMP     Component Value Date/Time   NA 135 (A) 09/09/2022 0000   K 4.5 09/09/2022 0000   CL 99 09/09/2022 0000   CO2 18 09/09/2022  0000   GLUCOSE 92 01/19/2022 0438   BUN 11 09/09/2022 0000   CREATININE 1.4 (A) 09/09/2022 0000   CREATININE 0.89 01/19/2022 0438   CREATININE 0.87 02/26/2020 0805   CALCIUM 10.0 09/09/2022 0000   PROT 7.3 01/18/2022 1332  ALBUMIN 4.5 09/09/2022 0000   AST 90 (A) 09/09/2022 0000   ALT 103 (A) 09/09/2022 0000   ALKPHOS 235 (A) 09/09/2022 0000   BILITOT 0.5 01/18/2022 1332   GFRNONAA >60 01/19/2022 0438   GFRNONAA 71 02/26/2020 0805   GFRAA 83 02/26/2020 0805   Recent Results (from the past 2160 hour(s))  Basic metabolic panel     Status: Abnormal   Collection Time: 09/09/22 12:00 AM  Result Value Ref Range   Glucose 113    BUN 11 4 - 21   CO2 18 13 - 22   Creatinine 1.4 (A) 0.5 - 1.1   Potassium 4.5 3.5 - 5.1 mEq/L   Sodium 135 (A) 137 - 147   Chloride 99 99 - 108  Comprehensive metabolic panel     Status: None   Collection Time: 09/09/22 12:00 AM  Result Value Ref Range   Globulin 3.5    Calcium 10.0 8.7 - 10.7   Albumin 4.5 3.5 - 5.0  Hepatic function panel     Status: Abnormal   Collection Time: 09/09/22 12:00 AM  Result Value Ref Range   Alkaline Phosphatase 235 (A) 25 - 125   ALT 103 (A) 7 - 35 U/L   AST 90 (A) 13 - 35   Bilirubin, Total 0.2   Vitamin B12     Status: None   Collection Time: 09/09/22 12:00 AM  Result Value Ref Range   Vitamin B-12 1,014   TSH     Status: Abnormal   Collection Time: 09/09/22 12:00 AM  Result Value Ref Range   TSH 41.90 (A) 0.41 - 5.90    Comment: T4,Free 0.88      May 16, 2018 labs showed TSH <0.006, free T4 1.74  Her thyroid ultrasound on August 15, 2017 showed right lobe 4.3 cm and left lobe 4.1 cm.  Scattered bilateral small nodules, largest on the right lobe 0.5 cm.  Repeat thyroid ultrasound from February 28, 2018 is unremarkable, no nodular lesions.  Recent Results (from the past 2160 hour(s))  Basic metabolic panel     Status: Abnormal   Collection Time: 09/09/22 12:00 AM  Result Value Ref Range   Glucose 113     BUN 11 4 - 21   CO2 18 13 - 22   Creatinine 1.4 (A) 0.5 - 1.1   Potassium 4.5 3.5 - 5.1 mEq/L   Sodium 135 (A) 137 - 147   Chloride 99 99 - 108  Comprehensive metabolic panel     Status: None   Collection Time: 09/09/22 12:00 AM  Result Value Ref Range   Globulin 3.5    Calcium 10.0 8.7 - 10.7   Albumin 4.5 3.5 - 5.0  Hepatic function panel     Status: Abnormal   Collection Time: 09/09/22 12:00 AM  Result Value Ref Range   Alkaline Phosphatase 235 (A) 25 - 125   ALT 103 (A) 7 - 35 U/L   AST 90 (A) 13 - 35   Bilirubin, Total 0.2   Vitamin B12     Status: None   Collection Time: 09/09/22 12:00 AM  Result Value Ref Range   Vitamin B-12 1,014   TSH     Status: Abnormal   Collection Time: 09/09/22 12:00 AM  Result Value Ref Range   TSH 41.90 (A) 0.41 - 5.90    Comment: T4,Free 0.88      Assessment & Plan:   1.  RAI induced hypothyroidism  2.  Graves'  disease-resolved status post RAI treatment  Her most recent thyroid function tests are consistent with under replacement.  This would indicate that she needs a higher dose, however this is due to the fact that she is losing a portion of her dose through the colostomy.   Currently, patient is dealing with acute on chronic anxiety, likely exacerbated by her upcoming colostomy reversal surgery.  She has a supportive husband who accompanied her to clinic today.  -In the interest of safety, and given her presentation with palpitations, I discussed and lowered her levothyroxine to 100 mcg p.o. daily before breakfast.   - We discussed about the correct intake of her thyroid hormone, on empty stomach at fasting, with water, separated by at least 30 minutes from breakfast and other medications,  and separated by more than 4 hours from calcium, iron, multivitamins, acid reflux medications (PPIs). -Patient is made aware of the fact that thyroid hormone replacement is needed for life, dose to be adjusted by periodic monitoring of thyroid  function tests. She will have repeat thyroid function test in 2 to 3 weeks for better evaluation.  She understands there may be an need to postpone her surgery until her thyroid function test numbers are reasonably stabilized.   - I advised patient to maintain close follow up with Sasser, Silvestre Moment, MD for primary care needs.   I spent 21 minutes in the care of the patient today including review of labs from Thyroid Function, CMP, and other relevant labs ; imaging/biopsy records (current and previous including abstractions from other facilities); face-to-face time discussing  her lab results and symptoms, medications doses, her options of short and long term treatment based on the latest standards of care / guidelines;   and documenting the encounter.  Isabella Scott  participated in the discussions, expressed understanding, and voiced agreement with the above plans.  All questions were answered to her satisfaction. she is encouraged to contact clinic should she have any questions or concerns prior to her return visit.    Follow up plan: Return for Keep Reg. Appt. with Pre-visit Labs.   Glade Lloyd, MD [facility] [address] [place], [place] [address] Phone: [phone]  Fax: [phone]     10/26/2022, 12:48 PM  This note was partially dictated with voice recognition software. Similar sounding words can be transcribed inadequately or may not  be corrected upon review.Pt states for the past 1-2 weeks she has been experiencing increased anxiety, "warming feeling", rapid heart beat and lightheadedness.

## 2022-10-26 NOTE — Telephone Encounter (Signed)
Pt said that the increase in her thyroid medication is causing warmness feeling in her body and rapid heart beat. Patient is in lobby. I made nurse aware

## 2022-11-13 DIAGNOSIS — Z885 Allergy status to narcotic agent status: Secondary | ICD-10-CM | POA: Diagnosis not present

## 2022-11-13 DIAGNOSIS — R079 Chest pain, unspecified: Secondary | ICD-10-CM | POA: Diagnosis not present

## 2022-11-13 DIAGNOSIS — Z882 Allergy status to sulfonamides status: Secondary | ICD-10-CM | POA: Diagnosis not present

## 2022-11-13 DIAGNOSIS — Z888 Allergy status to other drugs, medicaments and biological substances status: Secondary | ICD-10-CM | POA: Diagnosis not present

## 2022-11-13 DIAGNOSIS — R0789 Other chest pain: Secondary | ICD-10-CM | POA: Diagnosis not present

## 2022-11-13 DIAGNOSIS — I10 Essential (primary) hypertension: Secondary | ICD-10-CM | POA: Diagnosis not present

## 2022-11-14 NOTE — Telephone Encounter (Signed)
Discussed with pt, understanding voiced. 

## 2022-11-14 NOTE — Telephone Encounter (Signed)
Patient said ever since you decreased her 100 mcg, she has kept a headache. She went to the ER yesterday and they treated her with tylenol. She said she had labs done there in [place]. She is asking for advise  Call back [phone]

## 2022-11-19 DIAGNOSIS — R030 Elevated blood-pressure reading, without diagnosis of hypertension: Secondary | ICD-10-CM | POA: Diagnosis not present

## 2022-11-19 DIAGNOSIS — Z6822 Body mass index (BMI) 22.0-22.9, adult: Secondary | ICD-10-CM | POA: Diagnosis not present

## 2022-11-19 DIAGNOSIS — G43909 Migraine, unspecified, not intractable, without status migrainosus: Secondary | ICD-10-CM | POA: Diagnosis not present

## 2022-11-19 DIAGNOSIS — E039 Hypothyroidism, unspecified: Secondary | ICD-10-CM | POA: Diagnosis not present

## 2022-11-19 DIAGNOSIS — F419 Anxiety disorder, unspecified: Secondary | ICD-10-CM | POA: Diagnosis not present

## 2022-11-21 DIAGNOSIS — E079 Disorder of thyroid, unspecified: Secondary | ICD-10-CM | POA: Diagnosis not present

## 2022-11-21 DIAGNOSIS — R0981 Nasal congestion: Secondary | ICD-10-CM | POA: Diagnosis not present

## 2022-11-21 DIAGNOSIS — Z1152 Encounter for screening for COVID-19: Secondary | ICD-10-CM | POA: Diagnosis not present

## 2022-11-21 DIAGNOSIS — I480 Paroxysmal atrial fibrillation: Secondary | ICD-10-CM | POA: Diagnosis not present

## 2022-11-21 DIAGNOSIS — F419 Anxiety disorder, unspecified: Secondary | ICD-10-CM | POA: Diagnosis not present

## 2022-11-21 DIAGNOSIS — Z932 Ileostomy status: Secondary | ICD-10-CM | POA: Diagnosis not present

## 2022-11-21 DIAGNOSIS — I10 Essential (primary) hypertension: Secondary | ICD-10-CM | POA: Diagnosis not present

## 2022-11-21 DIAGNOSIS — G44209 Tension-type headache, unspecified, not intractable: Secondary | ICD-10-CM | POA: Diagnosis not present

## 2022-11-21 DIAGNOSIS — R002 Palpitations: Secondary | ICD-10-CM | POA: Diagnosis not present

## 2022-11-21 DIAGNOSIS — R519 Headache, unspecified: Secondary | ICD-10-CM | POA: Diagnosis not present

## 2022-11-21 DIAGNOSIS — K5090 Crohn's disease, unspecified, without complications: Secondary | ICD-10-CM | POA: Diagnosis not present

## 2022-11-26 DIAGNOSIS — E079 Disorder of thyroid, unspecified: Secondary | ICD-10-CM | POA: Diagnosis not present

## 2022-11-26 DIAGNOSIS — R42 Dizziness and giddiness: Secondary | ICD-10-CM | POA: Diagnosis not present

## 2022-11-26 DIAGNOSIS — R519 Headache, unspecified: Secondary | ICD-10-CM | POA: Diagnosis not present

## 2022-11-26 DIAGNOSIS — H5789 Other specified disorders of eye and adnexa: Secondary | ICD-10-CM | POA: Diagnosis not present

## 2022-11-26 DIAGNOSIS — R946 Abnormal results of thyroid function studies: Secondary | ICD-10-CM | POA: Diagnosis not present

## 2022-11-26 DIAGNOSIS — E860 Dehydration: Secondary | ICD-10-CM | POA: Diagnosis not present

## 2022-11-26 DIAGNOSIS — Z882 Allergy status to sulfonamides status: Secondary | ICD-10-CM | POA: Diagnosis not present

## 2022-11-26 DIAGNOSIS — I10 Essential (primary) hypertension: Secondary | ICD-10-CM | POA: Diagnosis not present

## 2022-11-26 DIAGNOSIS — Z885 Allergy status to narcotic agent status: Secondary | ICD-10-CM | POA: Diagnosis not present

## 2022-12-04 ENCOUNTER — Ambulatory Visit: Payer: Medicare PPO | Admitting: "Endocrinology

## 2022-12-04 DIAGNOSIS — R252 Cramp and spasm: Secondary | ICD-10-CM | POA: Diagnosis not present

## 2022-12-04 DIAGNOSIS — F419 Anxiety disorder, unspecified: Secondary | ICD-10-CM | POA: Diagnosis not present

## 2022-12-04 DIAGNOSIS — I10 Essential (primary) hypertension: Secondary | ICD-10-CM | POA: Diagnosis not present

## 2022-12-04 DIAGNOSIS — K5090 Crohn's disease, unspecified, without complications: Secondary | ICD-10-CM | POA: Diagnosis not present

## 2022-12-04 DIAGNOSIS — R531 Weakness: Secondary | ICD-10-CM | POA: Diagnosis not present

## 2022-12-04 DIAGNOSIS — E0500 Thyrotoxicosis with diffuse goiter without thyrotoxic crisis or storm: Secondary | ICD-10-CM | POA: Diagnosis not present

## 2022-12-04 DIAGNOSIS — Z932 Ileostomy status: Secondary | ICD-10-CM | POA: Diagnosis not present

## 2022-12-04 DIAGNOSIS — Z882 Allergy status to sulfonamides status: Secondary | ICD-10-CM | POA: Diagnosis not present

## 2022-12-04 DIAGNOSIS — Z885 Allergy status to narcotic agent status: Secondary | ICD-10-CM | POA: Diagnosis not present

## 2022-12-05 DIAGNOSIS — E039 Hypothyroidism, unspecified: Secondary | ICD-10-CM | POA: Diagnosis not present

## 2022-12-06 DIAGNOSIS — Z6822 Body mass index (BMI) 22.0-22.9, adult: Secondary | ICD-10-CM | POA: Diagnosis not present

## 2022-12-06 DIAGNOSIS — E039 Hypothyroidism, unspecified: Secondary | ICD-10-CM | POA: Diagnosis not present

## 2022-12-06 DIAGNOSIS — F419 Anxiety disorder, unspecified: Secondary | ICD-10-CM | POA: Diagnosis not present

## 2022-12-06 DIAGNOSIS — G43909 Migraine, unspecified, not intractable, without status migrainosus: Secondary | ICD-10-CM | POA: Diagnosis not present

## 2022-12-06 DIAGNOSIS — R030 Elevated blood-pressure reading, without diagnosis of hypertension: Secondary | ICD-10-CM | POA: Diagnosis not present

## 2022-12-12 DIAGNOSIS — M79604 Pain in right leg: Secondary | ICD-10-CM | POA: Diagnosis not present

## 2022-12-12 DIAGNOSIS — M79605 Pain in left leg: Secondary | ICD-10-CM | POA: Diagnosis not present

## 2022-12-12 DIAGNOSIS — Z882 Allergy status to sulfonamides status: Secondary | ICD-10-CM | POA: Diagnosis not present

## 2022-12-12 DIAGNOSIS — Z79899 Other long term (current) drug therapy: Secondary | ICD-10-CM | POA: Diagnosis not present

## 2022-12-12 DIAGNOSIS — Z885 Allergy status to narcotic agent status: Secondary | ICD-10-CM | POA: Diagnosis not present

## 2022-12-12 DIAGNOSIS — Z139 Encounter for screening, unspecified: Secondary | ICD-10-CM | POA: Diagnosis not present

## 2022-12-12 DIAGNOSIS — K5090 Crohn's disease, unspecified, without complications: Secondary | ICD-10-CM | POA: Diagnosis not present

## 2022-12-12 DIAGNOSIS — E876 Hypokalemia: Secondary | ICD-10-CM | POA: Diagnosis not present

## 2022-12-12 DIAGNOSIS — Z79890 Hormone replacement therapy (postmenopausal): Secondary | ICD-10-CM | POA: Diagnosis not present

## 2022-12-21 DIAGNOSIS — Z932 Ileostomy status: Secondary | ICD-10-CM | POA: Diagnosis not present

## 2022-12-21 DIAGNOSIS — K5090 Crohn's disease, unspecified, without complications: Secondary | ICD-10-CM | POA: Diagnosis not present

## 2022-12-26 DIAGNOSIS — M62838 Other muscle spasm: Secondary | ICD-10-CM | POA: Diagnosis not present

## 2022-12-26 DIAGNOSIS — R030 Elevated blood-pressure reading, without diagnosis of hypertension: Secondary | ICD-10-CM | POA: Diagnosis not present

## 2022-12-26 DIAGNOSIS — Z932 Ileostomy status: Secondary | ICD-10-CM | POA: Diagnosis not present

## 2022-12-26 DIAGNOSIS — Z6822 Body mass index (BMI) 22.0-22.9, adult: Secondary | ICD-10-CM | POA: Diagnosis not present

## 2022-12-26 DIAGNOSIS — K5090 Crohn's disease, unspecified, without complications: Secondary | ICD-10-CM | POA: Diagnosis not present

## 2023-01-01 DIAGNOSIS — E039 Hypothyroidism, unspecified: Secondary | ICD-10-CM | POA: Diagnosis not present

## 2023-01-02 DIAGNOSIS — R002 Palpitations: Secondary | ICD-10-CM | POA: Diagnosis not present

## 2023-01-02 DIAGNOSIS — F419 Anxiety disorder, unspecified: Secondary | ICD-10-CM | POA: Diagnosis not present

## 2023-01-10 ENCOUNTER — Ambulatory Visit: Payer: Medicare PPO | Admitting: "Endocrinology

## 2023-01-10 DIAGNOSIS — I10 Essential (primary) hypertension: Secondary | ICD-10-CM | POA: Diagnosis not present

## 2023-01-10 DIAGNOSIS — R079 Chest pain, unspecified: Secondary | ICD-10-CM | POA: Diagnosis not present

## 2023-01-10 DIAGNOSIS — E860 Dehydration: Secondary | ICD-10-CM | POA: Diagnosis not present

## 2023-01-10 DIAGNOSIS — K5090 Crohn's disease, unspecified, without complications: Secondary | ICD-10-CM | POA: Diagnosis not present

## 2023-01-10 DIAGNOSIS — Z933 Colostomy status: Secondary | ICD-10-CM | POA: Diagnosis not present

## 2023-01-10 DIAGNOSIS — E0500 Thyrotoxicosis with diffuse goiter without thyrotoxic crisis or storm: Secondary | ICD-10-CM | POA: Diagnosis not present

## 2023-01-10 DIAGNOSIS — R911 Solitary pulmonary nodule: Secondary | ICD-10-CM | POA: Diagnosis not present

## 2023-01-11 DIAGNOSIS — I10 Essential (primary) hypertension: Secondary | ICD-10-CM | POA: Diagnosis not present

## 2023-01-11 DIAGNOSIS — E0500 Thyrotoxicosis with diffuse goiter without thyrotoxic crisis or storm: Secondary | ICD-10-CM | POA: Diagnosis not present

## 2023-01-11 DIAGNOSIS — G4762 Sleep related leg cramps: Secondary | ICD-10-CM | POA: Diagnosis not present

## 2023-01-11 DIAGNOSIS — M79662 Pain in left lower leg: Secondary | ICD-10-CM | POA: Diagnosis not present

## 2023-01-11 DIAGNOSIS — Z79899 Other long term (current) drug therapy: Secondary | ICD-10-CM | POA: Diagnosis not present

## 2023-01-11 DIAGNOSIS — K5090 Crohn's disease, unspecified, without complications: Secondary | ICD-10-CM | POA: Diagnosis not present

## 2023-01-11 DIAGNOSIS — M79661 Pain in right lower leg: Secondary | ICD-10-CM | POA: Diagnosis not present

## 2023-01-11 DIAGNOSIS — Z87891 Personal history of nicotine dependence: Secondary | ICD-10-CM | POA: Diagnosis not present

## 2023-01-11 DIAGNOSIS — Z932 Ileostomy status: Secondary | ICD-10-CM | POA: Diagnosis not present

## 2023-01-13 DIAGNOSIS — Z882 Allergy status to sulfonamides status: Secondary | ICD-10-CM | POA: Diagnosis not present

## 2023-01-13 DIAGNOSIS — Z885 Allergy status to narcotic agent status: Secondary | ICD-10-CM | POA: Diagnosis not present

## 2023-01-13 DIAGNOSIS — Z888 Allergy status to other drugs, medicaments and biological substances status: Secondary | ICD-10-CM | POA: Diagnosis not present

## 2023-01-13 DIAGNOSIS — R002 Palpitations: Secondary | ICD-10-CM | POA: Diagnosis not present

## 2023-01-13 DIAGNOSIS — R000 Tachycardia, unspecified: Secondary | ICD-10-CM | POA: Diagnosis not present

## 2023-01-13 DIAGNOSIS — F419 Anxiety disorder, unspecified: Secondary | ICD-10-CM | POA: Diagnosis not present

## 2023-01-13 DIAGNOSIS — I10 Essential (primary) hypertension: Secondary | ICD-10-CM | POA: Diagnosis not present

## 2023-01-13 DIAGNOSIS — Z8639 Personal history of other endocrine, nutritional and metabolic disease: Secondary | ICD-10-CM | POA: Diagnosis not present

## 2023-01-13 DIAGNOSIS — Z79899 Other long term (current) drug therapy: Secondary | ICD-10-CM | POA: Diagnosis not present

## 2023-01-17 DIAGNOSIS — I10 Essential (primary) hypertension: Secondary | ICD-10-CM | POA: Diagnosis not present

## 2023-01-17 DIAGNOSIS — M79605 Pain in left leg: Secondary | ICD-10-CM | POA: Diagnosis not present

## 2023-01-17 DIAGNOSIS — M79662 Pain in left lower leg: Secondary | ICD-10-CM | POA: Diagnosis not present

## 2023-01-17 DIAGNOSIS — Z885 Allergy status to narcotic agent status: Secondary | ICD-10-CM | POA: Diagnosis not present

## 2023-01-17 DIAGNOSIS — Z882 Allergy status to sulfonamides status: Secondary | ICD-10-CM | POA: Diagnosis not present

## 2023-01-17 DIAGNOSIS — F419 Anxiety disorder, unspecified: Secondary | ICD-10-CM | POA: Diagnosis not present

## 2023-01-17 DIAGNOSIS — Z87891 Personal history of nicotine dependence: Secondary | ICD-10-CM | POA: Diagnosis not present

## 2023-02-06 DIAGNOSIS — G43909 Migraine, unspecified, not intractable, without status migrainosus: Secondary | ICD-10-CM | POA: Diagnosis not present

## 2023-02-06 DIAGNOSIS — E039 Hypothyroidism, unspecified: Secondary | ICD-10-CM | POA: Diagnosis not present

## 2023-02-06 DIAGNOSIS — Z6822 Body mass index (BMI) 22.0-22.9, adult: Secondary | ICD-10-CM | POA: Diagnosis not present

## 2023-02-06 DIAGNOSIS — F419 Anxiety disorder, unspecified: Secondary | ICD-10-CM | POA: Diagnosis not present

## 2023-02-06 DIAGNOSIS — R030 Elevated blood-pressure reading, without diagnosis of hypertension: Secondary | ICD-10-CM | POA: Diagnosis not present

## 2023-02-11 DIAGNOSIS — Z20818 Contact with and (suspected) exposure to other bacterial communicable diseases: Secondary | ICD-10-CM | POA: Diagnosis not present

## 2023-02-12 DIAGNOSIS — E860 Dehydration: Secondary | ICD-10-CM | POA: Diagnosis not present

## 2023-02-12 DIAGNOSIS — Z882 Allergy status to sulfonamides status: Secondary | ICD-10-CM | POA: Diagnosis not present

## 2023-02-12 DIAGNOSIS — F419 Anxiety disorder, unspecified: Secondary | ICD-10-CM | POA: Diagnosis not present

## 2023-02-12 DIAGNOSIS — Z885 Allergy status to narcotic agent status: Secondary | ICD-10-CM | POA: Diagnosis not present

## 2023-02-12 DIAGNOSIS — Z932 Ileostomy status: Secondary | ICD-10-CM | POA: Diagnosis not present

## 2023-02-12 DIAGNOSIS — Z79899 Other long term (current) drug therapy: Secondary | ICD-10-CM | POA: Diagnosis not present

## 2023-02-18 DIAGNOSIS — F0394 Unspecified dementia, unspecified severity, with anxiety: Secondary | ICD-10-CM | POA: Diagnosis not present

## 2023-02-18 DIAGNOSIS — K5090 Crohn's disease, unspecified, without complications: Secondary | ICD-10-CM | POA: Diagnosis not present

## 2023-02-18 DIAGNOSIS — B450 Pulmonary cryptococcosis: Secondary | ICD-10-CM | POA: Diagnosis not present

## 2023-02-18 DIAGNOSIS — K039 Disease of hard tissues of teeth, unspecified: Secondary | ICD-10-CM | POA: Diagnosis not present

## 2023-02-18 DIAGNOSIS — K660 Peritoneal adhesions (postprocedural) (postinfection): Secondary | ICD-10-CM | POA: Diagnosis not present

## 2023-02-18 DIAGNOSIS — G8918 Other acute postprocedural pain: Secondary | ICD-10-CM | POA: Diagnosis not present

## 2023-02-18 DIAGNOSIS — I10 Essential (primary) hypertension: Secondary | ICD-10-CM | POA: Diagnosis not present

## 2023-02-18 DIAGNOSIS — F0393 Unspecified dementia, unspecified severity, with mood disturbance: Secondary | ICD-10-CM | POA: Diagnosis not present

## 2023-02-18 DIAGNOSIS — Z932 Ileostomy status: Secondary | ICD-10-CM | POA: Diagnosis not present

## 2023-02-18 DIAGNOSIS — F32A Depression, unspecified: Secondary | ICD-10-CM | POA: Diagnosis not present

## 2023-02-18 DIAGNOSIS — I491 Atrial premature depolarization: Secondary | ICD-10-CM | POA: Diagnosis not present

## 2023-02-18 DIAGNOSIS — F418 Other specified anxiety disorders: Secondary | ICD-10-CM | POA: Diagnosis not present

## 2023-02-18 DIAGNOSIS — R918 Other nonspecific abnormal finding of lung field: Secondary | ICD-10-CM | POA: Diagnosis not present

## 2023-02-18 DIAGNOSIS — Z432 Encounter for attention to ileostomy: Secondary | ICD-10-CM | POA: Diagnosis not present

## 2023-02-18 DIAGNOSIS — E890 Postprocedural hypothyroidism: Secondary | ICD-10-CM | POA: Diagnosis not present

## 2023-02-18 DIAGNOSIS — I4891 Unspecified atrial fibrillation: Secondary | ICD-10-CM | POA: Diagnosis not present

## 2023-02-18 DIAGNOSIS — R109 Unspecified abdominal pain: Secondary | ICD-10-CM | POA: Diagnosis not present

## 2023-03-11 DIAGNOSIS — Z48815 Encounter for surgical aftercare following surgery on the digestive system: Secondary | ICD-10-CM | POA: Diagnosis not present

## 2023-03-11 DIAGNOSIS — Z932 Ileostomy status: Secondary | ICD-10-CM | POA: Diagnosis not present

## 2023-03-19 DIAGNOSIS — E039 Hypothyroidism, unspecified: Secondary | ICD-10-CM | POA: Diagnosis not present

## 2023-03-19 DIAGNOSIS — R5383 Other fatigue: Secondary | ICD-10-CM | POA: Diagnosis not present

## 2023-03-20 DIAGNOSIS — Z6822 Body mass index (BMI) 22.0-22.9, adult: Secondary | ICD-10-CM | POA: Diagnosis not present

## 2023-03-20 DIAGNOSIS — E039 Hypothyroidism, unspecified: Secondary | ICD-10-CM | POA: Diagnosis not present

## 2023-03-20 DIAGNOSIS — F419 Anxiety disorder, unspecified: Secondary | ICD-10-CM | POA: Diagnosis not present

## 2023-03-20 DIAGNOSIS — R030 Elevated blood-pressure reading, without diagnosis of hypertension: Secondary | ICD-10-CM | POA: Diagnosis not present

## 2023-03-20 DIAGNOSIS — K50918 Crohn's disease, unspecified, with other complication: Secondary | ICD-10-CM | POA: Diagnosis not present

## 2023-03-20 DIAGNOSIS — R519 Headache, unspecified: Secondary | ICD-10-CM | POA: Diagnosis not present

## 2023-03-28 DIAGNOSIS — Z1231 Encounter for screening mammogram for malignant neoplasm of breast: Secondary | ICD-10-CM | POA: Diagnosis not present

## 2023-04-08 DIAGNOSIS — R000 Tachycardia, unspecified: Secondary | ICD-10-CM | POA: Diagnosis not present

## 2023-04-08 DIAGNOSIS — F419 Anxiety disorder, unspecified: Secondary | ICD-10-CM | POA: Diagnosis not present

## 2023-04-08 DIAGNOSIS — R911 Solitary pulmonary nodule: Secondary | ICD-10-CM | POA: Diagnosis not present

## 2023-04-08 DIAGNOSIS — H0520 Unspecified exophthalmos: Secondary | ICD-10-CM | POA: Diagnosis not present

## 2023-04-08 DIAGNOSIS — I10 Essential (primary) hypertension: Secondary | ICD-10-CM | POA: Diagnosis not present

## 2023-04-08 DIAGNOSIS — K5090 Crohn's disease, unspecified, without complications: Secondary | ICD-10-CM | POA: Diagnosis not present

## 2023-04-08 DIAGNOSIS — R002 Palpitations: Secondary | ICD-10-CM | POA: Diagnosis not present

## 2023-04-08 DIAGNOSIS — R918 Other nonspecific abnormal finding of lung field: Secondary | ICD-10-CM | POA: Diagnosis not present

## 2023-04-08 DIAGNOSIS — E876 Hypokalemia: Secondary | ICD-10-CM | POA: Diagnosis not present

## 2023-04-08 DIAGNOSIS — R079 Chest pain, unspecified: Secondary | ICD-10-CM | POA: Diagnosis not present

## 2023-04-08 DIAGNOSIS — E0500 Thyrotoxicosis with diffuse goiter without thyrotoxic crisis or storm: Secondary | ICD-10-CM | POA: Diagnosis not present

## 2023-04-17 DIAGNOSIS — L509 Urticaria, unspecified: Secondary | ICD-10-CM | POA: Diagnosis not present

## 2023-04-17 DIAGNOSIS — L309 Dermatitis, unspecified: Secondary | ICD-10-CM | POA: Diagnosis not present

## 2023-04-17 DIAGNOSIS — K50918 Crohn's disease, unspecified, with other complication: Secondary | ICD-10-CM | POA: Diagnosis not present

## 2023-04-17 DIAGNOSIS — R519 Headache, unspecified: Secondary | ICD-10-CM | POA: Diagnosis not present

## 2023-04-17 DIAGNOSIS — Z6822 Body mass index (BMI) 22.0-22.9, adult: Secondary | ICD-10-CM | POA: Diagnosis not present

## 2023-04-17 DIAGNOSIS — F419 Anxiety disorder, unspecified: Secondary | ICD-10-CM | POA: Diagnosis not present

## 2023-04-17 DIAGNOSIS — E039 Hypothyroidism, unspecified: Secondary | ICD-10-CM | POA: Diagnosis not present

## 2023-04-17 DIAGNOSIS — R030 Elevated blood-pressure reading, without diagnosis of hypertension: Secondary | ICD-10-CM | POA: Diagnosis not present

## 2023-05-07 DIAGNOSIS — Z6822 Body mass index (BMI) 22.0-22.9, adult: Secondary | ICD-10-CM | POA: Diagnosis not present

## 2023-05-07 DIAGNOSIS — R030 Elevated blood-pressure reading, without diagnosis of hypertension: Secondary | ICD-10-CM | POA: Diagnosis not present

## 2023-05-07 DIAGNOSIS — K624 Stenosis of anus and rectum: Secondary | ICD-10-CM | POA: Diagnosis not present

## 2023-05-07 DIAGNOSIS — I499 Cardiac arrhythmia, unspecified: Secondary | ICD-10-CM | POA: Diagnosis not present

## 2023-05-07 DIAGNOSIS — E890 Postprocedural hypothyroidism: Secondary | ICD-10-CM | POA: Diagnosis not present

## 2023-05-07 DIAGNOSIS — K2970 Gastritis, unspecified, without bleeding: Secondary | ICD-10-CM | POA: Diagnosis not present

## 2023-05-07 DIAGNOSIS — K5090 Crohn's disease, unspecified, without complications: Secondary | ICD-10-CM | POA: Diagnosis not present

## 2023-05-07 DIAGNOSIS — I700 Atherosclerosis of aorta: Secondary | ICD-10-CM | POA: Diagnosis not present

## 2023-05-07 DIAGNOSIS — Z79890 Hormone replacement therapy (postmenopausal): Secondary | ICD-10-CM | POA: Diagnosis not present

## 2023-05-07 DIAGNOSIS — R000 Tachycardia, unspecified: Secondary | ICD-10-CM | POA: Diagnosis not present

## 2023-05-07 DIAGNOSIS — I10 Essential (primary) hypertension: Secondary | ICD-10-CM | POA: Diagnosis not present

## 2023-05-07 DIAGNOSIS — K8020 Calculus of gallbladder without cholecystitis without obstruction: Secondary | ICD-10-CM | POA: Diagnosis not present

## 2023-05-07 DIAGNOSIS — K56600 Partial intestinal obstruction, unspecified as to cause: Secondary | ICD-10-CM | POA: Diagnosis not present

## 2023-05-07 DIAGNOSIS — R1030 Lower abdominal pain, unspecified: Secondary | ICD-10-CM | POA: Diagnosis not present

## 2023-05-07 DIAGNOSIS — R109 Unspecified abdominal pain: Secondary | ICD-10-CM | POA: Diagnosis not present

## 2023-05-07 DIAGNOSIS — E039 Hypothyroidism, unspecified: Secondary | ICD-10-CM | POA: Diagnosis not present

## 2023-05-07 DIAGNOSIS — K9189 Other postprocedural complications and disorders of digestive system: Secondary | ICD-10-CM | POA: Diagnosis not present

## 2023-05-07 DIAGNOSIS — B450 Pulmonary cryptococcosis: Secondary | ICD-10-CM | POA: Diagnosis not present

## 2023-05-07 DIAGNOSIS — K449 Diaphragmatic hernia without obstruction or gangrene: Secondary | ICD-10-CM | POA: Diagnosis not present

## 2023-05-07 DIAGNOSIS — E0500 Thyrotoxicosis with diffuse goiter without thyrotoxic crisis or storm: Secondary | ICD-10-CM | POA: Diagnosis not present

## 2023-05-07 DIAGNOSIS — K50012 Crohn's disease of small intestine with intestinal obstruction: Secondary | ICD-10-CM | POA: Diagnosis not present

## 2023-05-07 DIAGNOSIS — R10817 Generalized abdominal tenderness: Secondary | ICD-10-CM | POA: Diagnosis not present

## 2023-05-07 DIAGNOSIS — K5669 Other partial intestinal obstruction: Secondary | ICD-10-CM | POA: Diagnosis not present

## 2023-05-07 DIAGNOSIS — K6289 Other specified diseases of anus and rectum: Secondary | ICD-10-CM | POA: Diagnosis not present

## 2023-05-07 DIAGNOSIS — D259 Leiomyoma of uterus, unspecified: Secondary | ICD-10-CM | POA: Diagnosis not present

## 2023-05-07 DIAGNOSIS — F410 Panic disorder [episodic paroxysmal anxiety] without agoraphobia: Secondary | ICD-10-CM | POA: Diagnosis not present

## 2023-05-07 DIAGNOSIS — K56609 Unspecified intestinal obstruction, unspecified as to partial versus complete obstruction: Secondary | ICD-10-CM | POA: Diagnosis not present

## 2023-05-07 DIAGNOSIS — K440 Diaphragmatic hernia with obstruction, without gangrene: Secondary | ICD-10-CM | POA: Diagnosis not present

## 2023-05-07 DIAGNOSIS — Z79899 Other long term (current) drug therapy: Secondary | ICD-10-CM | POA: Diagnosis not present

## 2023-05-07 DIAGNOSIS — F419 Anxiety disorder, unspecified: Secondary | ICD-10-CM | POA: Diagnosis not present

## 2023-05-10 DIAGNOSIS — F419 Anxiety disorder, unspecified: Secondary | ICD-10-CM | POA: Diagnosis not present

## 2023-05-10 DIAGNOSIS — F410 Panic disorder [episodic paroxysmal anxiety] without agoraphobia: Secondary | ICD-10-CM | POA: Diagnosis not present

## 2023-05-10 DIAGNOSIS — R030 Elevated blood-pressure reading, without diagnosis of hypertension: Secondary | ICD-10-CM | POA: Diagnosis not present

## 2023-05-10 DIAGNOSIS — Z6822 Body mass index (BMI) 22.0-22.9, adult: Secondary | ICD-10-CM | POA: Diagnosis not present

## 2023-05-10 DIAGNOSIS — K56609 Unspecified intestinal obstruction, unspecified as to partial versus complete obstruction: Secondary | ICD-10-CM | POA: Diagnosis not present

## 2023-05-10 DIAGNOSIS — E039 Hypothyroidism, unspecified: Secondary | ICD-10-CM | POA: Diagnosis not present

## 2023-05-27 DIAGNOSIS — E039 Hypothyroidism, unspecified: Secondary | ICD-10-CM | POA: Diagnosis not present

## 2023-05-27 DIAGNOSIS — Z6823 Body mass index (BMI) 23.0-23.9, adult: Secondary | ICD-10-CM | POA: Diagnosis not present

## 2023-05-27 DIAGNOSIS — R030 Elevated blood-pressure reading, without diagnosis of hypertension: Secondary | ICD-10-CM | POA: Diagnosis not present

## 2023-05-27 DIAGNOSIS — F411 Generalized anxiety disorder: Secondary | ICD-10-CM | POA: Diagnosis not present

## 2023-05-28 DIAGNOSIS — K50012 Crohn's disease of small intestine with intestinal obstruction: Secondary | ICD-10-CM | POA: Diagnosis not present

## 2023-05-29 DIAGNOSIS — I499 Cardiac arrhythmia, unspecified: Secondary | ICD-10-CM | POA: Diagnosis not present

## 2023-05-29 DIAGNOSIS — K219 Gastro-esophageal reflux disease without esophagitis: Secondary | ICD-10-CM | POA: Diagnosis not present

## 2023-05-29 DIAGNOSIS — N952 Postmenopausal atrophic vaginitis: Secondary | ICD-10-CM | POA: Diagnosis not present

## 2023-05-29 DIAGNOSIS — J309 Allergic rhinitis, unspecified: Secondary | ICD-10-CM | POA: Diagnosis not present

## 2023-05-29 DIAGNOSIS — F411 Generalized anxiety disorder: Secondary | ICD-10-CM | POA: Diagnosis not present

## 2023-05-29 DIAGNOSIS — I129 Hypertensive chronic kidney disease with stage 1 through stage 4 chronic kidney disease, or unspecified chronic kidney disease: Secondary | ICD-10-CM | POA: Diagnosis not present

## 2023-05-29 DIAGNOSIS — E039 Hypothyroidism, unspecified: Secondary | ICD-10-CM | POA: Diagnosis not present

## 2023-05-29 DIAGNOSIS — M1990 Unspecified osteoarthritis, unspecified site: Secondary | ICD-10-CM | POA: Diagnosis not present

## 2023-05-29 DIAGNOSIS — E876 Hypokalemia: Secondary | ICD-10-CM | POA: Diagnosis not present

## 2023-06-01 DIAGNOSIS — Z79890 Hormone replacement therapy (postmenopausal): Secondary | ICD-10-CM | POA: Diagnosis not present

## 2023-06-01 DIAGNOSIS — R002 Palpitations: Secondary | ICD-10-CM | POA: Diagnosis not present

## 2023-06-01 DIAGNOSIS — E890 Postprocedural hypothyroidism: Secondary | ICD-10-CM | POA: Diagnosis not present

## 2023-06-01 DIAGNOSIS — R0602 Shortness of breath: Secondary | ICD-10-CM | POA: Diagnosis not present

## 2023-06-01 DIAGNOSIS — R000 Tachycardia, unspecified: Secondary | ICD-10-CM | POA: Diagnosis not present

## 2023-06-01 DIAGNOSIS — Z885 Allergy status to narcotic agent status: Secondary | ICD-10-CM | POA: Diagnosis not present

## 2023-06-01 DIAGNOSIS — Z882 Allergy status to sulfonamides status: Secondary | ICD-10-CM | POA: Diagnosis not present

## 2023-06-01 DIAGNOSIS — E0500 Thyrotoxicosis with diffuse goiter without thyrotoxic crisis or storm: Secondary | ICD-10-CM | POA: Diagnosis not present

## 2023-06-01 DIAGNOSIS — I10 Essential (primary) hypertension: Secondary | ICD-10-CM | POA: Diagnosis not present

## 2023-06-01 DIAGNOSIS — F419 Anxiety disorder, unspecified: Secondary | ICD-10-CM | POA: Diagnosis not present

## 2023-06-01 DIAGNOSIS — Z9889 Other specified postprocedural states: Secondary | ICD-10-CM | POA: Diagnosis not present

## 2023-07-02 ENCOUNTER — Ambulatory Visit: Payer: Medicare PPO | Attending: Nurse Practitioner | Admitting: Nurse Practitioner

## 2023-07-02 ENCOUNTER — Encounter: Payer: Self-pay | Admitting: Nurse Practitioner

## 2023-07-02 VITALS — BP 132/80 | HR 75 | Ht 65.0 in | Wt 146.6 lb

## 2023-07-02 DIAGNOSIS — R002 Palpitations: Secondary | ICD-10-CM

## 2023-07-02 DIAGNOSIS — Z87898 Personal history of other specified conditions: Secondary | ICD-10-CM

## 2023-07-02 DIAGNOSIS — E039 Hypothyroidism, unspecified: Secondary | ICD-10-CM | POA: Diagnosis not present

## 2023-07-02 DIAGNOSIS — F419 Anxiety disorder, unspecified: Secondary | ICD-10-CM | POA: Diagnosis not present

## 2023-07-02 DIAGNOSIS — I10 Essential (primary) hypertension: Secondary | ICD-10-CM | POA: Diagnosis not present

## 2023-07-02 NOTE — Progress Notes (Unsigned)
Cardiology Office Note:  .   Date:  07/02/2023 ID:  Isabella Scott, DOB Oct 08, 1956, MRN [id number] PCP: Estanislado Pandy, MD  [facility] Providers Cardiologist:  Nona Dell, MD    History of Present Illness: .   Isabella Scott is a 67 y.o. female with a PMH of palpitations, dizziness, Crohn's disease, hypothyroidism, arthritis, anxiety, pulmonary cryptococcosis, small bowel obstruction and GERD, who presents today for hospital follow-up.   Seen by Dr. Diona Browner on November 23, 2020 for evaluation for palpitations at the request of her PCP.  Noticed palpitations when feeling stressed or anxious.  Denied any spontaneous palpitations.  Dr. Diona Browner recommended working with behavioral health specialist as symptoms correlated with stress and anxiety.  Recent ED visit at [facility] for palpitations, felt like her heart was racing. TSH level elevated.   Today she presents for follow-up with her husband.  Admits to AM episodes of "bad anxiety" reported by husband that are related to her palpitations. Husband says her heart sounds like "horses are running" when she is having one of these episodes. Pt says that after her thyroid medication was recently adjusted, she is feeling better and hasn't noticed recent palpitations. Hasn't seen therapy in a while. Denies any chest pain, shortness of breath, recent palpitations, presyncope, dizziness, orthopnea, PND, swelling or significant weight changes, acute bleeding, or claudication. Husband does state episode of anxiety caused her to pass out once. Pt does admit to recent stressor as her brother deals with PTSD and became tearful during interview explaining this to me.  EKG: EKG Interpretation Date/Time:  Tuesday July 02 2023 16:01:54 EDT Ventricular Rate:  75 PR Interval:  170 QRS Duration:  80 QT Interval:  378 QTC Calculation: 422 R Axis:   64  Text Interpretation: Normal sinus rhythm Normal ECG No previous ECGs available Confirmed  by Sharlene Dory [phone]) on 07/02/2023 4:35:29 PM   Risk Assessment/Calculations:       The 10-year ASCVD risk score (Arnett DK, et al., 2019) is: 7.3%*   Values used to calculate the score:     Age: 71 years     Sex: Female     Is Non-Hispanic African American: Yes     Diabetic: No     Tobacco smoker: No     Systolic Blood Pressure: 132 mmHg     Is BP treated: No     HDL Cholesterol: 51 mg/dL*     Total Cholesterol: 152 mg/dL*     * - Cholesterol units were assumed for this score calculation  Physical Exam:   VS:  BP 132/80 (BP Location: Right Arm, Patient Position: Sitting, Cuff Size: Normal)   Pulse 75   Ht 5\' 5"  (1.651 m)   Wt 146 lb 9.6 oz (66.5 kg)   SpO2 96%   BMI 24.40 kg/m    Wt Readings from Last 3 Encounters:  07/02/23 146 lb 9.6 oz (66.5 kg)  10/26/22 140 lb 12.8 oz (63.9 kg)  10/09/22 139 lb 9.6 oz (63.3 kg)    GEN: Well nourished, well developed in no acute distress, appears nervous and tearful at one point during interview (see HPI) NECK: No JVD; No carotid bruits CARDIAC: S1/S2, RRR, no murmurs, rubs, gallops RESPIRATORY:  Clear to auscultation without rales, wheezing or rhonchi  ABDOMEN: Soft, non-tender, non-distended EXTREMITIES:  No edema; No deformity   ASSESSMENT AND PLAN: .    Palpitations Related to #3 - see below. EKG looks unremarkable today. I agree with Dr. Ival Bible  previous assessment from last progress note. Recommend pt would benefit from therapy sessions and/or GAD-7 screening to possibly see if medication needs to be adjusted. Continue to follow with PCP. No monitor indicated at this time.   Hypothyroidism Medication recently adjusted by provider. Managed by PCP - continue to f/u with PCP.   3. Anxiety, hx of syncope Denies any red flag signs/symptoms. Husband reports symptoms have caused her in past to have passing out episode - sounds vasovagal and triggered by anxiety. Recommend to follow-up with PCP who can refer pt to previous  therapist that pt found to be therapeutic. Care and ED precautions discussed.   Dispo: Follow-up in 1 year or sooner if anything changes.   Signed, Sharlene Dory, NP

## 2023-07-02 NOTE — Patient Instructions (Addendum)
Medication Instructions:  Your physician recommends that you continue on your current medications as directed. Please refer to the Current Medication list given to you today.  Labwork: None  Testing/Procedures: None  Follow-Up: Your physician recommends that you schedule a follow-up appointment in: 1 Year with Philis Nettle   Any Other Special Instructions Will Be Listed Below (If Applicable).  If you need a refill on your cardiac medications before your next appointment, please call your pharmacy.

## 2023-07-05 DIAGNOSIS — E039 Hypothyroidism, unspecified: Secondary | ICD-10-CM | POA: Diagnosis not present

## 2023-07-05 DIAGNOSIS — R030 Elevated blood-pressure reading, without diagnosis of hypertension: Secondary | ICD-10-CM | POA: Diagnosis not present

## 2023-07-05 DIAGNOSIS — R5383 Other fatigue: Secondary | ICD-10-CM | POA: Diagnosis not present

## 2023-07-05 DIAGNOSIS — R002 Palpitations: Secondary | ICD-10-CM | POA: Diagnosis not present

## 2023-07-05 DIAGNOSIS — R232 Flushing: Secondary | ICD-10-CM | POA: Diagnosis not present

## 2023-07-05 DIAGNOSIS — F411 Generalized anxiety disorder: Secondary | ICD-10-CM | POA: Diagnosis not present

## 2023-07-05 DIAGNOSIS — Z6823 Body mass index (BMI) 23.0-23.9, adult: Secondary | ICD-10-CM | POA: Diagnosis not present

## 2023-07-09 DIAGNOSIS — R002 Palpitations: Secondary | ICD-10-CM | POA: Diagnosis not present

## 2023-07-09 DIAGNOSIS — F411 Generalized anxiety disorder: Secondary | ICD-10-CM | POA: Diagnosis not present

## 2023-07-09 DIAGNOSIS — E039 Hypothyroidism, unspecified: Secondary | ICD-10-CM | POA: Diagnosis not present

## 2023-07-09 DIAGNOSIS — R232 Flushing: Secondary | ICD-10-CM | POA: Diagnosis not present

## 2023-07-09 DIAGNOSIS — R5383 Other fatigue: Secondary | ICD-10-CM | POA: Diagnosis not present

## 2023-07-09 DIAGNOSIS — Z6823 Body mass index (BMI) 23.0-23.9, adult: Secondary | ICD-10-CM | POA: Diagnosis not present

## 2023-07-09 DIAGNOSIS — R030 Elevated blood-pressure reading, without diagnosis of hypertension: Secondary | ICD-10-CM | POA: Diagnosis not present

## 2023-07-12 DIAGNOSIS — Z882 Allergy status to sulfonamides status: Secondary | ICD-10-CM | POA: Diagnosis not present

## 2023-07-12 DIAGNOSIS — Z888 Allergy status to other drugs, medicaments and biological substances status: Secondary | ICD-10-CM | POA: Diagnosis not present

## 2023-07-12 DIAGNOSIS — F419 Anxiety disorder, unspecified: Secondary | ICD-10-CM | POA: Diagnosis not present

## 2023-07-12 DIAGNOSIS — Z885 Allergy status to narcotic agent status: Secondary | ICD-10-CM | POA: Diagnosis not present

## 2023-07-12 DIAGNOSIS — Z79899 Other long term (current) drug therapy: Secondary | ICD-10-CM | POA: Diagnosis not present

## 2023-07-12 DIAGNOSIS — R079 Chest pain, unspecified: Secondary | ICD-10-CM | POA: Diagnosis not present

## 2023-07-12 DIAGNOSIS — R002 Palpitations: Secondary | ICD-10-CM | POA: Diagnosis not present

## 2023-07-12 DIAGNOSIS — I10 Essential (primary) hypertension: Secondary | ICD-10-CM | POA: Diagnosis not present

## 2023-07-19 DIAGNOSIS — E890 Postprocedural hypothyroidism: Secondary | ICD-10-CM | POA: Diagnosis not present

## 2023-07-21 DIAGNOSIS — R42 Dizziness and giddiness: Secondary | ICD-10-CM | POA: Diagnosis not present

## 2023-07-21 DIAGNOSIS — R519 Headache, unspecified: Secondary | ICD-10-CM | POA: Diagnosis not present

## 2023-07-21 DIAGNOSIS — Z79890 Hormone replacement therapy (postmenopausal): Secondary | ICD-10-CM | POA: Diagnosis not present

## 2023-07-21 DIAGNOSIS — I10 Essential (primary) hypertension: Secondary | ICD-10-CM | POA: Diagnosis not present

## 2023-07-21 DIAGNOSIS — E039 Hypothyroidism, unspecified: Secondary | ICD-10-CM | POA: Diagnosis not present

## 2023-07-21 DIAGNOSIS — Z79899 Other long term (current) drug therapy: Secondary | ICD-10-CM | POA: Diagnosis not present

## 2023-07-21 DIAGNOSIS — M79605 Pain in left leg: Secondary | ICD-10-CM | POA: Diagnosis not present

## 2023-07-21 DIAGNOSIS — R002 Palpitations: Secondary | ICD-10-CM | POA: Diagnosis not present

## 2023-07-22 DIAGNOSIS — M79605 Pain in left leg: Secondary | ICD-10-CM | POA: Diagnosis not present

## 2023-07-24 DIAGNOSIS — R000 Tachycardia, unspecified: Secondary | ICD-10-CM | POA: Diagnosis not present

## 2023-07-24 DIAGNOSIS — F419 Anxiety disorder, unspecified: Secondary | ICD-10-CM | POA: Diagnosis not present

## 2023-07-24 DIAGNOSIS — R110 Nausea: Secondary | ICD-10-CM | POA: Diagnosis not present

## 2023-07-24 DIAGNOSIS — I10 Essential (primary) hypertension: Secondary | ICD-10-CM | POA: Diagnosis not present

## 2023-07-24 DIAGNOSIS — Z1152 Encounter for screening for COVID-19: Secondary | ICD-10-CM | POA: Diagnosis not present

## 2023-07-24 DIAGNOSIS — Z20822 Contact with and (suspected) exposure to covid-19: Secondary | ICD-10-CM | POA: Diagnosis not present

## 2023-07-24 DIAGNOSIS — R42 Dizziness and giddiness: Secondary | ICD-10-CM | POA: Diagnosis not present

## 2023-07-24 DIAGNOSIS — R002 Palpitations: Secondary | ICD-10-CM | POA: Diagnosis not present

## 2023-07-29 LAB — TSH: TSH: 23 — AB (ref 0.41–5.90)

## 2023-07-30 DIAGNOSIS — E890 Postprocedural hypothyroidism: Secondary | ICD-10-CM | POA: Diagnosis not present

## 2023-08-06 ENCOUNTER — Ambulatory Visit: Payer: Medicare PPO | Admitting: Nurse Practitioner

## 2023-08-20 DIAGNOSIS — K50918 Crohn's disease, unspecified, with other complication: Secondary | ICD-10-CM | POA: Diagnosis not present

## 2023-08-20 DIAGNOSIS — L710 Perioral dermatitis: Secondary | ICD-10-CM | POA: Diagnosis not present

## 2023-08-20 DIAGNOSIS — F419 Anxiety disorder, unspecified: Secondary | ICD-10-CM | POA: Diagnosis not present

## 2023-08-20 DIAGNOSIS — L509 Urticaria, unspecified: Secondary | ICD-10-CM | POA: Diagnosis not present

## 2023-08-20 DIAGNOSIS — R519 Headache, unspecified: Secondary | ICD-10-CM | POA: Diagnosis not present

## 2023-08-20 DIAGNOSIS — E039 Hypothyroidism, unspecified: Secondary | ICD-10-CM | POA: Diagnosis not present

## 2023-08-20 DIAGNOSIS — Z6824 Body mass index (BMI) 24.0-24.9, adult: Secondary | ICD-10-CM | POA: Diagnosis not present

## 2023-08-20 DIAGNOSIS — R030 Elevated blood-pressure reading, without diagnosis of hypertension: Secondary | ICD-10-CM | POA: Diagnosis not present

## 2023-08-27 DIAGNOSIS — E039 Hypothyroidism, unspecified: Secondary | ICD-10-CM | POA: Diagnosis not present

## 2023-08-28 ENCOUNTER — Encounter: Payer: Self-pay | Admitting: "Endocrinology

## 2023-09-03 ENCOUNTER — Encounter: Payer: Self-pay | Admitting: "Endocrinology

## 2023-09-12 DIAGNOSIS — R002 Palpitations: Secondary | ICD-10-CM | POA: Diagnosis not present

## 2023-09-12 DIAGNOSIS — Z79899 Other long term (current) drug therapy: Secondary | ICD-10-CM | POA: Diagnosis not present

## 2023-09-12 DIAGNOSIS — Z885 Allergy status to narcotic agent status: Secondary | ICD-10-CM | POA: Diagnosis not present

## 2023-09-12 DIAGNOSIS — I10 Essential (primary) hypertension: Secondary | ICD-10-CM | POA: Diagnosis not present

## 2023-09-12 DIAGNOSIS — Z79890 Hormone replacement therapy (postmenopausal): Secondary | ICD-10-CM | POA: Diagnosis not present

## 2023-09-16 DIAGNOSIS — R232 Flushing: Secondary | ICD-10-CM | POA: Diagnosis not present

## 2023-09-16 DIAGNOSIS — E039 Hypothyroidism, unspecified: Secondary | ICD-10-CM | POA: Diagnosis not present

## 2023-09-16 DIAGNOSIS — R030 Elevated blood-pressure reading, without diagnosis of hypertension: Secondary | ICD-10-CM | POA: Diagnosis not present

## 2023-09-16 DIAGNOSIS — Z6824 Body mass index (BMI) 24.0-24.9, adult: Secondary | ICD-10-CM | POA: Diagnosis not present

## 2023-09-16 DIAGNOSIS — G43909 Migraine, unspecified, not intractable, without status migrainosus: Secondary | ICD-10-CM | POA: Diagnosis not present

## 2023-09-16 DIAGNOSIS — R002 Palpitations: Secondary | ICD-10-CM | POA: Diagnosis not present

## 2023-09-16 DIAGNOSIS — F411 Generalized anxiety disorder: Secondary | ICD-10-CM | POA: Diagnosis not present

## 2023-09-18 DIAGNOSIS — Z882 Allergy status to sulfonamides status: Secondary | ICD-10-CM | POA: Diagnosis not present

## 2023-09-18 DIAGNOSIS — R519 Headache, unspecified: Secondary | ICD-10-CM | POA: Diagnosis not present

## 2023-09-18 DIAGNOSIS — Z888 Allergy status to other drugs, medicaments and biological substances status: Secondary | ICD-10-CM | POA: Diagnosis not present

## 2023-09-18 DIAGNOSIS — K5090 Crohn's disease, unspecified, without complications: Secondary | ICD-10-CM | POA: Diagnosis not present

## 2023-09-18 DIAGNOSIS — F419 Anxiety disorder, unspecified: Secondary | ICD-10-CM | POA: Diagnosis not present

## 2023-09-18 DIAGNOSIS — E0500 Thyrotoxicosis with diffuse goiter without thyrotoxic crisis or storm: Secondary | ICD-10-CM | POA: Diagnosis not present

## 2023-09-18 DIAGNOSIS — Z79890 Hormone replacement therapy (postmenopausal): Secondary | ICD-10-CM | POA: Diagnosis not present

## 2023-09-18 DIAGNOSIS — I10 Essential (primary) hypertension: Secondary | ICD-10-CM | POA: Diagnosis not present

## 2023-09-18 DIAGNOSIS — Z885 Allergy status to narcotic agent status: Secondary | ICD-10-CM | POA: Diagnosis not present

## 2023-09-27 ENCOUNTER — Other Ambulatory Visit: Payer: Self-pay

## 2023-09-27 DIAGNOSIS — F32A Depression, unspecified: Secondary | ICD-10-CM

## 2023-09-30 ENCOUNTER — Telehealth: Payer: Self-pay | Admitting: *Deleted

## 2023-09-30 NOTE — Progress Notes (Unsigned)
  Care Coordination  Outreach Note  09/30/2023 Name: LOTUS ZAWORSKI MRN: [id number] DOB: [birth year]   Care Coordination Outreach Attempts: An unsuccessful telephone outreach was attempted today to offer the patient information about available care coordination services.  Follow Up Plan:  Additional outreach attempts will be made to offer the patient care coordination information and services.   Encounter Outcome:  No Answer  Gwenevere Ghazi  Care Coordination Care Guide  Direct Dial: [phone]

## 2023-10-01 DIAGNOSIS — Z1330 Encounter for screening examination for mental health and behavioral disorders, unspecified: Secondary | ICD-10-CM | POA: Diagnosis not present

## 2023-10-01 DIAGNOSIS — F419 Anxiety disorder, unspecified: Secondary | ICD-10-CM | POA: Diagnosis not present

## 2023-10-01 DIAGNOSIS — H5789 Other specified disorders of eye and adnexa: Secondary | ICD-10-CM | POA: Diagnosis not present

## 2023-10-01 DIAGNOSIS — E890 Postprocedural hypothyroidism: Secondary | ICD-10-CM | POA: Diagnosis not present

## 2023-10-01 DIAGNOSIS — K50919 Crohn's disease, unspecified, with unspecified complications: Secondary | ICD-10-CM | POA: Diagnosis not present

## 2023-10-01 DIAGNOSIS — E079 Disorder of thyroid, unspecified: Secondary | ICD-10-CM | POA: Diagnosis not present

## 2023-10-02 ENCOUNTER — Ambulatory Visit: Payer: Self-pay | Admitting: *Deleted

## 2023-10-02 ENCOUNTER — Encounter: Payer: Self-pay | Admitting: *Deleted

## 2023-10-02 NOTE — Progress Notes (Signed)
  Care Coordination   Note   10/02/2023 Name: Isabella Scott MRN: [id number] DOB: December 25, 1955  Isabella Scott is a 67 y.o. year old female who sees Sasser, Clarene Critchley, MD for primary care. I reached out to Tresa Endo by phone today to offer care coordination services.  [name] was given information about Care Coordination services today including:   The Care Coordination services include support from the care team which includes your Nurse Coordinator, Clinical Social Worker, or Pharmacist.  The Care Coordination team is here to help remove barriers to the health concerns and goals most important to you. Care Coordination services are voluntary, and the patient may decline or stop services at any time by request to their care team member.   Care Coordination Consent Status: Patient agreed to services and verbal consent obtained.   Follow up plan:  Telephone appointment with care coordination team member scheduled for:  11/20  Encounter Outcome:  Patient Scheduled   [facility] Coordination Care Guide  Direct Dial: [phone]

## 2023-10-03 NOTE — Patient Outreach (Signed)
Care Coordination   Initial Visit Note   10/03/2023 Name: Isabella Scott MRN: [id number] DOB: 28-Jun-1956  Isabella Scott is a 67 y.o. year old female who sees Sasser, Clarene Critchley, MD for primary care. I spoke with  Isabella Scott by phone today.  What matters to the patients health and wellness today?  Symptoms of anxiety (palpitations and warm sensation in chest) that she feels is related to her thyroid medication.     Goals Addressed             This Visit's Progress    Care Coordination Needs       Care Management Goals: Patient will take medications as directed Levothyroxine 125 mcg at least 30 minutes before meals and other medications and 4 hours before acid reducers  Patient will state understanding that there can be a difference in levothyroxine between different manufacturers and between name brand and generic Patient will state understanding of side effects of levothyroxine and symptoms of thyroid disease Patient will continue to work with PCP and endocrinologist regarding management of hypothyroidism Patient will talk with Isabella Scott, PharmD with [facility] regarding medication management and potential interactions Patient will talk with Isabella Bad, LCSW [phone]) regarding anxiety Patient will reach out to RN Care Management Coordinator at [phone] as needed        SDOH assessments and interventions completed:  Yes  SDOH Interventions Today    Flowsheet Row Most Recent Value  SDOH Interventions   Housing Interventions Intervention Not Indicated  Transportation Interventions Patient Resources (Friends/Family)  Isabella Scott takes her to appointments but availability is limited by his work schedule.]  Utilities Interventions Intervention Not Indicated  Health Literacy Interventions Intervention Not Indicated        Care Coordination Interventions:  Yes, provided  Interventions Today    Flowsheet Row Most Recent Value  Chronic Disease   Chronic  disease during today's visit Other  [hypothyroidism S/P radiation of thyroid 5 years ago for hyperthyroidism.]  General Interventions   General Interventions Discussed/Reviewed General Interventions Discussed, General Interventions Reviewed, Doctor Visits, Communication with  Doctor Visits Discussed/Reviewed Doctor Visits Discussed, Doctor Visits Reviewed, PCP, Specialist  [Discussed visits with PCP and visits with 2 endocrinologists over the past 6 months]  PCP/Specialist Visits Compliance with follow-up visit  [Appt. with Isabella Scott, PharmD on 10/22/23. Discuss rescheduling F/U with Dr Neita Carp to the first week in January since she started current dose of levothyroxine on 09/28/23. Needs repeat labs 6 to 8 weeks after starting new dose. Dr Shawnee Knapp on 01/07/24.]  Communication with Pharmacists, Social Work  [employer] message to [employer], LCSW RE: new patient visit. Secure Fax to Isabella Scott, PharmD at [facility] with todays telephone note.]  Exercise Interventions   Exercise Discussed/Reviewed Physical Activity  [Able to perform ADLs independently. Does often depend on husband for transportation on days that she doesn't feel well or for appointments outide of local area.]  Physical Activity Discussed/Reviewed Physical Activity Discussed, Physical Activity Reviewed  [encouraged to remain active. Encouraged 150 minutes of moderate activity per week as tolerated]  Education Interventions   Education Provided Provided Education  Provided Verbal Education On Nutrition, Medication, Labs, When to see the doctor, Exercise, Mental Health/Coping with Illness, Other  [side effects of levothyroxine & symptoms of thyroid disease. Discussed that even though appropriate, increasing dose may cause symptoms similar to hyperthyroidism initially & should improve over time. Should discuss with PharmD & provider.]  Labs Reviewed --  [Per [facility] report 08/28/23 TSH 23,  T4 0.99.  Per patient, 09/13/23 TS 20.1 at endocrinology  appointment.]  Mental Health Interventions   Mental Health Discussed/Reviewed Mental Health Discussed, Anxiety, Mental Health Reviewed, Refer to Social Work for counseling  Refer to Social Work for counseling regarding Anxiety/Coping  [employer] & endocrinologists recommended that patient talk with mental health specialist RE: symptoms of anxiety (primarily palpitations & a warm sensation in her chest). They do not feel that thyroid med is causing these symptoms. Patient is agreeable.]  Nutrition Interventions   Nutrition Discussed/Reviewed Nutrition Discussed, Nutrition Reviewed  [Eat 3 meals per day. Wait to eat breakfast at least 30 minutes after taking levothyroxine.]  Pharmacy Interventions   Pharmacy Dicussed/Reviewed Pharmacy Topics Discussed, Pharmacy Topics Reviewed, Medications and their functions, Medication Adherence  [Take levothyroxine as directed 30 minutes before meals & other meds. Take 4 hours before acid reducers. Talk with PharmD. Continue other meds as directed. Take benzodiazepine 3 times per day as directed.]  Medication Adherence --  [Has self adjusted levothyroxine in recent past based on perceived side effects. Advised that she may be sensitive to changes in dose and may experience those side effects until she adjusts. Lab results take 6 to 8 weeks to reflect a change.]  Safety Interventions   Safety Discussed/Reviewed Safety Discussed, Safety Reviewed  Advanced Directive Interventions   Advanced Directives Discussed/Reviewed Advanced Directives Discussed, Advanced Directives Reviewed  [encouraged to consider]       Follow up plan: Follow up call scheduled for Isabella Mam, LCSW 10/07/23 and RN Care Manager 10/25/23.    Encounter Outcome:  Patient Visit Completed   Demetrios Loll, RN, BSN Care Management Coordinator [facility]  [facility] Direct Dial: [phone] Main #: [phone]

## 2023-10-07 ENCOUNTER — Encounter: Payer: Self-pay | Admitting: *Deleted

## 2023-10-07 ENCOUNTER — Ambulatory Visit: Payer: Self-pay | Admitting: *Deleted

## 2023-10-07 NOTE — Patient Instructions (Signed)
Visit Information  Thank you for taking time to visit with me today. Please don't hesitate to contact me if I can be of assistance to you.   Following are the goals we discussed today:   Goals Addressed               This Visit's Progress     Receive Counseling & Supportive Services to Reduce & Manage Symptoms of Anxiety. (pt-stated)   On track     Care Coordination Interventions:  Interventions Today    Flowsheet Row Most Recent Value  Chronic Disease   Chronic disease during today's visit Hypertension (HTN), Other  [Crohn's Disease, Graves' Disease, Anxiety, Hypothyroidism, Headaches]  General Interventions   General Interventions Discussed/Reviewed General Interventions Discussed, Annual Foot Exam, Sick Day Rules, Level of Care, [employer], General Interventions Reviewed, [employer], Labs, Health Screening, Communication with, Vaccines, Horticulturist, commercial (DME), Doctor Visits  [Communication with Care Team Members]  Labs Hgb A1c every 3 months, Kidney Function  [Encouraged]  Vaccines COVID-19, Flu, Pneumonia, RSV, Shingles, Tetanus/Pertussis/Diphtheria  [Encouraged]  Doctor Visits Discussed/Reviewed Doctor Visits Discussed, Doctor Visits Reviewed, Annual Wellness Visits, PCP, Specialist  [Encouraged Routine Engagement]  Health Screening Bone Density, Colonoscopy, Mammogram  [Encouraged]  Durable Medical Equipment (DME) Other, BP Cuff  [Cane, Prescription Eyeglasses, Hand-Held Shower Hose]  PCP/Specialist Visits Compliance with follow-up visit  [Encouraged]  Communication with PCP/Specialists, RN, Pharmacists, Social Work  [employer] Routine Engagement]  Level of Presenter, broadcasting, Personal Care Services  [Encouraged Consideration of Applying for [employer] & Personal Care Services]  Applications Medicaid, Personal Care Services  Exercise Interventions   Exercise Discussed/Reviewed Exercise Discussed, Assistive device use and maintanence, Exercise Reviewed,  Physical Activity, Weight Managment  [Encouraged]  Physical Activity Discussed/Reviewed Physical Activity Discussed, Physical Activity Reviewed, Types of exercise, Home Exercise Program (HEP), PREP, Gym  [Encouraged]  Weight Management Weight maintenance  [Encouraged]  Education Interventions   Education Provided Provided Therapist, sports, Provided Web-based Education, Provided Education  Provided Verbal Education On Nutrition, Foot Care, Eye Care, Labs, Mental Health/Coping with Illness, [employer], When to see the doctor, Medication, Exercise, Human resources officer, Personal Care Services  Mental Health Interventions   Mental Health Discussed/Reviewed Mental Health Discussed, Grief and Loss, Substance Abuse, Mental Health Reviewed, Suicide, Coping Strategies, Crisis, Anxiety, Depression  [Assessed Mental Health Status]  Nutrition Interventions   Nutrition Discussed/Reviewed Nutrition Discussed, Fluid intake, Portion sizes, Nutrition Reviewed, Decreasing sugar intake, Carbohydrate meal planning, Increasing proteins, Decreasing fats, Adding fruits and vegetables, Decreasing salt  [Encouraged]  Pharmacy Interventions   Pharmacy Dicussed/Reviewed Affording Medications, Pharmacy Topics Discussed, Pharmacy Topics Reviewed, Medications and their functions, Medication Adherence  [Encouraged]  Medication Adherence --  [Confirmed Medication Compliance]  Safety Interventions   Safety Discussed/Reviewed Safety Discussed, Safety Reviewed  Advanced Directive Interventions   Advanced Directives Discussed/Reviewed Advanced Directives Discussed  [Encouraged Consideration, Offering to Mail Packet & Assist with Completion]      Assessed Social Determinant of Health Barriers. Discussed Plans for Ongoing Care Management Follow Up. Provided Careers information officer Information for Care Management Team Members. Screened for Signs & Symptoms of Depression, Related to Chronic Disease State.  PHQ2  & PHQ9 Depression Screen Completed & Results Reviewed.  Suicidal Ideation & Homicidal Ideation Assessed - None Present.   Domestic Violence Assessed - None Present. Access to Weapons Assessed - None Present.   Active Listening & Reflection Utilized.  Verbalization of Feelings Encouraged.  Emotional Support Provided. Feelings of Frustration Validated. Acute Pain in Head  Acknowledged. Crisis Support Information, Agencies, Services & Resources Discussed. Problem Solving Interventions Identified. Task-Centered Solutions Implemented.   Solution-Focused Strategies Developed. Acceptance & Commitment Therapy Introduced. Brief Cognitive Behavioral Therapy Initiated. Client-Centered Therapy Enacted. Reviewed Prescription Medications & Discussed Importance of Compliance. Quality of Sleep Assessed & Sleep Hygiene Techniques Promoted. Reviewed & Encouraged Daily Implementation of Deep Breathing Exercises, Relaxation Techniques & Mindfulness Meditation Strategies. Encouraged Routine Engagement with Danford Bad, Licensed Clinical Social Worker with [facility] [phone]), if You Have Questions, Need Assistance or If Additional Social Work Needs Are Identified Between Now & Our Next Follow-Up Outreach Call, Scheduled on 10/22/2023 at 11:45 AM.       Our next appointment is by telephone on 10/22/2023 at 11:45 am.  Please call the care guide team at [phone] if you need to cancel or reschedule your appointment.   If you are experiencing a Mental Health or Behavioral Health Crisis or need someone to talk to, please call the Suicide and Crisis Lifeline: 988 call the [place] National Suicide Prevention Lifeline: 463 720 2133 or TTY: 867-442-6018 TTY (979) 590-3223) to talk to a trained counselor call 1-800-273-TALK (toll free, 24 hour hotline) go to [facility] [address], [place] [phone]) call the [facility] Crisis Line:  [phone] call 911  Patient verbalizes understanding of instructions and care plan provided today and agrees to view in MyChart. Active MyChart status and patient understanding of how to access instructions and care plan via MyChart confirmed with patient.     Telephone follow up appointment with care management team member scheduled for:  10/22/2023 at 11:45 am.  Danford Bad, BSW, MSW, LCSW  Embedded Practice Social Work Case Manager  [facility], Population Health Direct Dial: [phone]  Fax: [phone] Email: [email or web address]: [email or web address]

## 2023-10-07 NOTE — Patient Outreach (Signed)
Care Coordination   Initial Visit Note   10/07/2023  Name: Isabella Scott MRN: [id number] DOB: [birth year]  Isabella Scott is a 67 y.o. year old female who sees Sasser, Clarene Critchley, MD for primary care. I spoke with Tresa Endo by phone today.  What matters to the patients health and wellness today?  Receive Counseling & Supportive Services to Reduce & Manage Symptoms of Anxiety.    Goals Addressed               This Visit's Progress     Receive Counseling & Supportive Services to Reduce & Manage Symptoms of Anxiety. (pt-stated)   On track     Care Coordination Interventions:  Interventions Today    Flowsheet Row Most Recent Value  Chronic Disease   Chronic disease during today's visit Hypertension (HTN), Other  [Crohn's Disease, Graves' Disease, Anxiety, Hypothyroidism, Headaches]  General Interventions   General Interventions Discussed/Reviewed General Interventions Discussed, Annual Foot Exam, Sick Day Rules, Level of Care, [employer], General Interventions Reviewed, [employer], Labs, Health Screening, Communication with, Vaccines, Horticulturist, commercial (DME), Doctor Visits  [Communication with Care Team Members]  Labs Hgb A1c every 3 months, Kidney Function  [Encouraged]  Vaccines COVID-19, Flu, Pneumonia, RSV, Shingles, Tetanus/Pertussis/Diphtheria  [Encouraged]  Doctor Visits Discussed/Reviewed Doctor Visits Discussed, Doctor Visits Reviewed, Annual Wellness Visits, PCP, Specialist  [Encouraged Routine Engagement]  Health Screening Bone Density, Colonoscopy, Mammogram  [Encouraged]  Durable Medical Equipment (DME) Other, BP Cuff  [Cane, Prescription Eyeglasses, Hand-Held Shower Hose]  PCP/Specialist Visits Compliance with follow-up visit  [Encouraged]  Communication with PCP/Specialists, RN, Pharmacists, Social Work  [employer] Routine Engagement]  Level of Presenter, broadcasting, Personal Care Services  [Encouraged Consideration of Applying for [employer] &  Personal Care Services]  Applications Medicaid, Personal Care Services  Exercise Interventions   Exercise Discussed/Reviewed Exercise Discussed, Assistive device use and maintanence, Exercise Reviewed, Physical Activity, Weight Managment  [Encouraged]  Physical Activity Discussed/Reviewed Physical Activity Discussed, Physical Activity Reviewed, Types of exercise, Home Exercise Program (HEP), PREP, Gym  [Encouraged]  Weight Management Weight maintenance  [Encouraged]  Education Interventions   Education Provided Provided Therapist, sports, Provided Web-based Education, Provided Education  Provided Verbal Education On Nutrition, Foot Care, Eye Care, Labs, Mental Health/Coping with Illness, [employer], When to see the doctor, Medication, Exercise, Human resources officer, Personal Care Services  Mental Health Interventions   Mental Health Discussed/Reviewed Mental Health Discussed, Grief and Loss, Substance Abuse, Mental Health Reviewed, Suicide, Coping Strategies, Crisis, Anxiety, Depression  [Assessed Mental Health Status]  Nutrition Interventions   Nutrition Discussed/Reviewed Nutrition Discussed, Fluid intake, Portion sizes, Nutrition Reviewed, Decreasing sugar intake, Carbohydrate meal planning, Increasing proteins, Decreasing fats, Adding fruits and vegetables, Decreasing salt  [Encouraged]  Pharmacy Interventions   Pharmacy Dicussed/Reviewed Affording Medications, Pharmacy Topics Discussed, Pharmacy Topics Reviewed, Medications and their functions, Medication Adherence  [Encouraged]  Medication Adherence --  [Confirmed Medication Compliance]  Safety Interventions   Safety Discussed/Reviewed Safety Discussed, Safety Reviewed  Advanced Directive Interventions   Advanced Directives Discussed/Reviewed Advanced Directives Discussed  [Encouraged Consideration, Offering to Mail Packet & Assist with Completion]      Assessed Social Determinant of Health  Barriers. Discussed Plans for Ongoing Care Management Follow Up. Provided Careers information officer Information for Care Management Team Members. Screened for Signs & Symptoms of Depression, Related to Chronic Disease State.  PHQ2 & PHQ9 Depression Screen Completed & Results Reviewed.  Suicidal Ideation & Homicidal Ideation Assessed - None Present.   Domestic  Violence Assessed - None Present. Access to Weapons Assessed - None Present.   Active Listening & Reflection Utilized.  Verbalization of Feelings Encouraged.  Emotional Support Provided. Feelings of Frustration Validated. Acute Pain in Head Acknowledged. Crisis Support Information, Agencies, Services & Resources Discussed. Problem Solving Interventions Identified. Task-Centered Solutions Implemented.   Solution-Focused Strategies Developed. Acceptance & Commitment Therapy Introduced. Brief Cognitive Behavioral Therapy Initiated. Client-Centered Therapy Enacted. Reviewed Prescription Medications & Discussed Importance of Compliance. Quality of Sleep Assessed & Sleep Hygiene Techniques Promoted. Reviewed & Encouraged Daily Implementation of Deep Breathing Exercises, Relaxation Techniques & Mindfulness Meditation Strategies. Encouraged Routine Engagement with Danford Bad, Licensed Clinical Social Worker with [facility] [phone]), if You Have Questions, Need Assistance or If Additional Social Work Needs Are Identified Between Now & Our Next Follow-Up Outreach Call, Scheduled on 10/22/2023 at 11:45 AM.         SDOH assessments and interventions completed:  Yes.  SDOH Interventions Today    Flowsheet Row Most Recent Value  SDOH Interventions   Food Insecurity Interventions Intervention Not Indicated  Housing Interventions Intervention Not Indicated  Transportation Interventions Intervention Not Indicated, Patient Resources (Friends/Family)  Utilities Interventions Intervention Not Indicated  Alcohol Usage  Interventions Intervention Not Indicated (Score <7)  Financial Strain Interventions Intervention Not Indicated  Physical Activity Interventions Intervention Not Indicated  Stress Interventions Intervention Not Indicated  Social Connections Interventions Intervention Not Indicated  Health Literacy Interventions Intervention Not Indicated     Care Coordination Interventions:  Yes, provided.   Follow up plan: Follow up call scheduled for 10/22/2023 at 11:45 am.  Encounter Outcome:  Patient Visit Completed.   Danford Bad, BSW, MSW, Printmaker Social Work Case Set designer Health  [facility], Population Health Direct Dial: [phone]  Fax: [phone] Email: [email or web address]

## 2023-10-22 ENCOUNTER — Ambulatory Visit: Payer: Self-pay | Admitting: *Deleted

## 2023-10-22 DIAGNOSIS — E039 Hypothyroidism, unspecified: Secondary | ICD-10-CM | POA: Diagnosis not present

## 2023-10-22 DIAGNOSIS — I10 Essential (primary) hypertension: Secondary | ICD-10-CM | POA: Diagnosis not present

## 2023-10-22 DIAGNOSIS — E538 Deficiency of other specified B group vitamins: Secondary | ICD-10-CM | POA: Diagnosis not present

## 2023-10-22 DIAGNOSIS — K50918 Crohn's disease, unspecified, with other complication: Secondary | ICD-10-CM | POA: Diagnosis not present

## 2023-10-22 DIAGNOSIS — R5383 Other fatigue: Secondary | ICD-10-CM | POA: Diagnosis not present

## 2023-10-22 NOTE — Patient Outreach (Signed)
Care Coordination   Follow Up Visit Note   10/22/2023  Name: Isabella Scott MRN: [id number] DOB: [birth year]  Isabella Scott is a 67 y.o. year old female who sees Sasser, Clarene Critchley, MD for primary care. I spoke with Tresa Endo by phone today.  What matters to the patients health and wellness today?  Receive Counseling & Supportive Services to Reduce & Manage Symptoms of Anxiety.    Goals Addressed               This Visit's Progress     Receive Counseling & Supportive Services to Reduce & Manage Symptoms of Anxiety. (pt-stated)   On track     Care Coordination Interventions:  Interventions Today    Flowsheet Row Most Recent Value  Chronic Disease   Chronic disease during today's visit Hypertension (HTN), Other  [Crohn's Disease, Graves' Disease, Anxiety, Hypothyroidism, Headaches]  General Interventions   General Interventions Discussed/Reviewed General Interventions Discussed, Annual Foot Exam, Sick Day Rules, Level of Care, [employer], General Interventions Reviewed, [employer], Labs, Health Screening, Communication with, Vaccines, Horticulturist, commercial (DME), Doctor Visits  [Communication with Care Team Members]  Labs Hgb A1c every 3 months, Kidney Function  [Encouraged]  Vaccines COVID-19, Flu, Pneumonia, RSV, Shingles, Tetanus/Pertussis/Diphtheria  [Encouraged]  Doctor Visits Discussed/Reviewed Doctor Visits Discussed, Doctor Visits Reviewed, Annual Wellness Visits, PCP, Specialist  [Encouraged Routine Engagement]  Health Screening Bone Density, Colonoscopy, Mammogram  [Encouraged]  Durable Medical Equipment (DME) Other, BP Cuff  [Cane, Prescription Eyeglasses, Hand-Held Shower Hose]  PCP/Specialist Visits Compliance with follow-up visit  [Encouraged]  Communication with PCP/Specialists, RN, Pharmacists, Social Work  [employer] Routine Engagement]  Level of Presenter, broadcasting, Personal Care Services  [Encouraged Consideration of Applying for [employer]  & Personal Care Services]  Applications Medicaid, Personal Care Services  Exercise Interventions   Exercise Discussed/Reviewed Exercise Discussed, Assistive device use and maintanence, Exercise Reviewed, Physical Activity, Weight Managment  [Encouraged]  Physical Activity Discussed/Reviewed Physical Activity Discussed, Physical Activity Reviewed, Types of exercise, Home Exercise Program (HEP), PREP, Gym  [Encouraged]  Weight Management Weight maintenance  [Encouraged]  Education Interventions   Education Provided Provided Therapist, sports, Provided Web-based Education, Provided Education  Provided Verbal Education On Nutrition, Foot Care, Eye Care, Labs, Mental Health/Coping with Illness, [employer], When to see the doctor, Medication, Exercise, Human resources officer, Personal Care Services  Mental Health Interventions   Mental Health Discussed/Reviewed Mental Health Discussed, Grief and Loss, Substance Abuse, Mental Health Reviewed, Suicide, Coping Strategies, Crisis, Anxiety, Depression  [Assessed Mental Health Status]  Nutrition Interventions   Nutrition Discussed/Reviewed Nutrition Discussed, Fluid intake, Portion sizes, Nutrition Reviewed, Decreasing sugar intake, Carbohydrate meal planning, Increasing proteins, Decreasing fats, Adding fruits and vegetables, Decreasing salt  [Encouraged]  Pharmacy Interventions   Pharmacy Dicussed/Reviewed Affording Medications, Pharmacy Topics Discussed, Pharmacy Topics Reviewed, Medications and their functions, Medication Adherence  [Encouraged]  Medication Adherence --  [Confirmed Medication Compliance]  Safety Interventions   Safety Discussed/Reviewed Safety Discussed, Safety Reviewed  Advanced Directive Interventions   Advanced Directives Discussed/Reviewed Advanced Directives Discussed  [Encouraged Consideration, Offering to Mail Packet & Assist with Completion]      Active Listening & Reflection Utilized.  Verbalization  of Feelings Encouraged.  Emotional Support Provided. Problem Solving Interventions Activated. Task-Centered Solutions Employed.   Solution-Focused Strategies Indicated. Acceptance & Commitment Therapy Initiated. Cognitive Behavioral Therapy Implemented. Client-Centered Therapy Performed. CSW Collaboration with Dr. Fara Chute, Primary Care Provider with [facility] [phone]), Via Routed Note  in Epic, to Report the Following Symptoms, Per Your Request:          1. Chest Tingling & Warmness 2. Intermittent Sharp Pains Around Heart 3. Heart Palpitations 4. Daily Migraine Headaches 5. Weakness & Fatigue 6. Anxiety 7. Side Effects of Levothyroxine Encouraged Routine Engagement with Danford Bad, Licensed Clinical Social Worker with [facility] [phone]), if You Have Questions, Need Assistance or If Additional Social Work Needs Are Identified Between Now & Our Next Follow-Up Outreach Call, Scheduled on 11/15/2023 at 10:45 AM.      SDOH assessments and interventions completed:  Yes.  Care Coordination Interventions:  Yes, provided.   Follow up plan: Follow up call scheduled for 11/15/2023 at 10:45 am.  Encounter Outcome:  Patient Visit Completed.   Danford Bad, BSW, MSW, Printmaker Social Work Case Set designer Health  [facility], Population Health Direct Dial: [phone]  Fax: [phone] Email: [email or web address] Website: [email or web address]

## 2023-10-22 NOTE — Patient Instructions (Signed)
Visit Information  Thank you for taking time to visit with me today. Please don't hesitate to contact me if I can be of assistance to you.   Following are the goals we discussed today:   Goals Addressed               This Visit's Progress     Receive Counseling & Supportive Services to Reduce & Manage Symptoms of Anxiety. (pt-stated)   On track     Care Coordination Interventions:  Interventions Today    Flowsheet Row Most Recent Value  Chronic Disease   Chronic disease during today's visit Hypertension (HTN), Other  [Crohn's Disease, Graves' Disease, Anxiety, Hypothyroidism, Headaches]  General Interventions   General Interventions Discussed/Reviewed General Interventions Discussed, Annual Foot Exam, Sick Day Rules, Level of Care, [employer], General Interventions Reviewed, [employer], Labs, Health Screening, Communication with, Vaccines, Horticulturist, commercial (DME), Doctor Visits  [Communication with Care Team Members]  Labs Hgb A1c every 3 months, Kidney Function  [Encouraged]  Vaccines COVID-19, Flu, Pneumonia, RSV, Shingles, Tetanus/Pertussis/Diphtheria  [Encouraged]  Doctor Visits Discussed/Reviewed Doctor Visits Discussed, Doctor Visits Reviewed, Annual Wellness Visits, PCP, Specialist  [Encouraged Routine Engagement]  Health Screening Bone Density, Colonoscopy, Mammogram  [Encouraged]  Durable Medical Equipment (DME) Other, BP Cuff  [Cane, Prescription Eyeglasses, Hand-Held Shower Hose]  PCP/Specialist Visits Compliance with follow-up visit  [Encouraged]  Communication with PCP/Specialists, RN, Pharmacists, Social Work  [employer] Routine Engagement]  Level of Presenter, broadcasting, Personal Care Services  [Encouraged Consideration of Applying for [employer] & Personal Care Services]  Applications Medicaid, Personal Care Services  Exercise Interventions   Exercise Discussed/Reviewed Exercise Discussed, Assistive device use and maintanence, Exercise Reviewed,  Physical Activity, Weight Managment  [Encouraged]  Physical Activity Discussed/Reviewed Physical Activity Discussed, Physical Activity Reviewed, Types of exercise, Home Exercise Program (HEP), PREP, Gym  [Encouraged]  Weight Management Weight maintenance  [Encouraged]  Education Interventions   Education Provided Provided Therapist, sports, Provided Web-based Education, Provided Education  Provided Verbal Education On Nutrition, Foot Care, Eye Care, Labs, Mental Health/Coping with Illness, [employer], When to see the doctor, Medication, Exercise, Human resources officer, Personal Care Services  Mental Health Interventions   Mental Health Discussed/Reviewed Mental Health Discussed, Grief and Loss, Substance Abuse, Mental Health Reviewed, Suicide, Coping Strategies, Crisis, Anxiety, Depression  [Assessed Mental Health Status]  Nutrition Interventions   Nutrition Discussed/Reviewed Nutrition Discussed, Fluid intake, Portion sizes, Nutrition Reviewed, Decreasing sugar intake, Carbohydrate meal planning, Increasing proteins, Decreasing fats, Adding fruits and vegetables, Decreasing salt  [Encouraged]  Pharmacy Interventions   Pharmacy Dicussed/Reviewed Affording Medications, Pharmacy Topics Discussed, Pharmacy Topics Reviewed, Medications and their functions, Medication Adherence  [Encouraged]  Medication Adherence --  [Confirmed Medication Compliance]  Safety Interventions   Safety Discussed/Reviewed Safety Discussed, Safety Reviewed  Advanced Directive Interventions   Advanced Directives Discussed/Reviewed Advanced Directives Discussed  [Encouraged Consideration, Offering to Mail Packet & Assist with Completion]      Active Listening & Reflection Utilized.  Verbalization of Feelings Encouraged.  Emotional Support Provided. Problem Solving Interventions Activated. Task-Centered Solutions Employed.   Solution-Focused Strategies Indicated. Acceptance & Commitment Therapy  Initiated. Cognitive Behavioral Therapy Implemented. Client-Centered Therapy Performed. CSW Collaboration with Dr. Fara Chute, Primary Care Provider with [facility] [phone]), Via Routed Note in Epic, to Report the Following Symptoms, Per Your Request: 1. Chest Tingling & Warmness 2. Intermittent Sharp Pains Around Heart 3. Heart Palpitations 4. Daily Migraine Headaches 5. Weakness & Fatigue 6. Anxiety 7. Side Effects  of Levothyroxine Encouraged Routine Engagement with Danford Bad, Licensed Clinical Social Worker with [facility] [phone]), if You Have Questions, Need Assistance or If Additional Social Work Needs Are Identified Between Now & Our Next Follow-Up Outreach Call, Scheduled on 11/15/2023 at 10:45 AM.      Our next appointment is by telephone on 11/15/2023 at 10:45 am.  Please call the care guide team at [phone] if you need to cancel or reschedule your appointment.   If you are experiencing a Mental Health or Behavioral Health Crisis or need someone to talk to, please call the Suicide and Crisis Lifeline: 988 call the [place] National Suicide Prevention Lifeline: 251-395-2406 or TTY: 931-424-5921 TTY (256)352-9874) to talk to a trained counselor call 1-800-273-TALK (toll free, 24 hour hotline) go to [facility] [address] [place] [phone]) call the [facility] Crisis Line: [phone] call 911  Patient verbalizes understanding of instructions and care plan provided today and agrees to view in MyChart. Active MyChart status and patient understanding of how to access instructions and care plan via MyChart confirmed with patient.     Telephone follow up appointment with care management team member scheduled for:  11/15/2023 at 10:45 am.  Danford Bad, BSW, MSW, LCSW  Embedded Practice Social Work Case Manager  [facility],  Population Health Direct Dial: [phone]  Fax: [phone] Email: [email or web address]

## 2023-10-25 ENCOUNTER — Encounter: Payer: Self-pay | Admitting: *Deleted

## 2023-10-25 ENCOUNTER — Ambulatory Visit: Payer: Self-pay | Admitting: *Deleted

## 2023-10-25 NOTE — Patient Outreach (Signed)
  Care Coordination   Follow Up Visit Note   10/25/2023 Name: SHEALEE MANRRIQUEZ MRN: [id number] DOB: [birth year]  TAMICIA BELLAR is a 67 y.o. year old female who sees Sasser, Clarene Critchley, MD for primary care. I spoke with  Tresa Endo by phone today.  What matters to the patients health and wellness today?  Managing hypothyroidism and side effects of medication    Goals Addressed             This Visit's Progress    Care Coordination Needs   On track    Care Management Goals: Patient will take medications as directed Levothyroxine 137 mcg at least 30 minutes before meals and other medications and 4 hours before acid reducers  Patient will keep appointment for thyroid lab recheck in 3 weeks at PCP office Patient will continue to talk with Steve Rattler, PharmD with [facility] regarding medication management  Patient will continue to talk with Danford Bad, LCSW [phone]) regarding anxiety Patient will reach out to RN Care Management Coordinator at [phone] as needed        SDOH assessments and interventions completed:  No     Care Coordination Interventions:  Yes, provided  Interventions Today    Flowsheet Row Most Recent Value  Chronic Disease   Chronic disease during today's visit Other  [hypothyroidism]  General Interventions   General Interventions Discussed/Reviewed General Interventions Discussed, General Interventions Reviewed, Labs, Doctor Visits, Communication with  Labs --  [Repeat thyroid labs in 3 weeks per PCP]  Doctor Visits Discussed/Reviewed Doctor Visits Discussed, Doctor Visits Reviewed, PCP  Malvin Johns Steve Rattler, PharmD on 10/22/23 and Dr Neita Carp on 10/29/23. Discussed thyroid management, symptoms, and medication side effects.]  PCP/Specialist Visits Compliance with follow-up visit  [follow-up with PCP in 3 weeks as planned for labs]  Communication with Social Work  [employer] message to [employer], LCSW with update on patient status]  Education  Interventions   Education Provided Provided Education  Provided Verbal Education On When to see the doctor, Medication, Labs  Labs Reviewed --  [08/28/23 TSH 23 viewed in [facility] tool. Other thyroid labs were unavailable for review.]  Pharmacy Interventions   Pharmacy Dicussed/Reviewed Pharmacy Topics Discussed, Pharmacy Topics Reviewed, Medications and their functions, Medication Adherence  [Patient is taking levothyroxine regularly at this time. Dose was increased from to after abnormal TSH. She has been on that dose for 2 days and can already feel a difference. Headaches and other symptoms have improved.]       Follow up plan: Follow up call scheduled for 11/20/23    Encounter Outcome:  Patient Visit Completed   Demetrios Loll, RN, BSN Care Manager [place]  [facility]  Population Health  Direct Dial: [phone] Main #: [phone]

## 2023-11-04 DIAGNOSIS — K50012 Crohn's disease of small intestine with intestinal obstruction: Secondary | ICD-10-CM | POA: Diagnosis not present

## 2023-11-04 DIAGNOSIS — K219 Gastro-esophageal reflux disease without esophagitis: Secondary | ICD-10-CM | POA: Diagnosis not present

## 2023-11-04 DIAGNOSIS — K5000 Crohn's disease of small intestine without complications: Secondary | ICD-10-CM | POA: Diagnosis not present

## 2023-11-04 DIAGNOSIS — Z79899 Other long term (current) drug therapy: Secondary | ICD-10-CM | POA: Diagnosis not present

## 2023-11-04 DIAGNOSIS — Z980 Intestinal bypass and anastomosis status: Secondary | ICD-10-CM | POA: Diagnosis not present

## 2023-11-04 DIAGNOSIS — E890 Postprocedural hypothyroidism: Secondary | ICD-10-CM | POA: Diagnosis not present

## 2023-11-04 DIAGNOSIS — I10 Essential (primary) hypertension: Secondary | ICD-10-CM | POA: Diagnosis not present

## 2023-11-04 DIAGNOSIS — K9189 Other postprocedural complications and disorders of digestive system: Secondary | ICD-10-CM | POA: Diagnosis not present

## 2023-11-12 DIAGNOSIS — I10 Essential (primary) hypertension: Secondary | ICD-10-CM | POA: Diagnosis not present

## 2023-11-12 DIAGNOSIS — E039 Hypothyroidism, unspecified: Secondary | ICD-10-CM | POA: Diagnosis not present

## 2023-11-12 DIAGNOSIS — G43909 Migraine, unspecified, not intractable, without status migrainosus: Secondary | ICD-10-CM | POA: Diagnosis not present

## 2023-11-15 ENCOUNTER — Ambulatory Visit: Payer: Self-pay | Admitting: *Deleted

## 2023-11-15 NOTE — Patient Outreach (Signed)
 Care Coordination   Follow Up Visit Note   11/15/2023  Name: Isabella Scott MRN: [id number] DOB: 04-25-1956  Isabella Scott is a 68 y.o. year old female who sees Sasser, Deward ORN, MD for primary care. I spoke with Isabella Scott by phone today.  What matters to the patients health and wellness today?  Receive Counseling & Supportive Services to Reduce & Manage Symptoms of Anxiety.    Goals Addressed               This Visit's Progress     Receive Counseling & Supportive Services to Reduce & Manage Symptoms of Anxiety. (pt-stated)   On track     Care Coordination Interventions:   Interventions Today    Flowsheet Row Most Recent Value  Chronic Disease   Chronic disease during today's visit Other, Hypertension (HTN)  [Crohn's Disease, Graves' Disease, Anxiety, Hypothyroidism, Hyperthyroidism, Headaches, Symptoms Associated with Medication Adjustment for Hypothyroidism, Cryptococcosis, Fatigue.]  General Interventions   General Interventions Discussed/Reviewed General Interventions Discussed, Labs, Vaccines, Doctor Visits, Health Screening, Annual Foot Exam, General Interventions Reviewed, Annual Eye Exam, Durable Medical Equipment (DME), Lipid Profile, [employer], Level of Care, Communication with  [Encouraged Routine Engagement with Care Team Members & Providers.]  Labs Hgb A1c every 3 months, Kidney Function  [Encouraged Routine Labwork.]  Vaccines COVID-19, Flu, Pneumonia, RSV, Shingles, Tetanus/Pertussis/Diphtheria  [Encouraged Routine Vaccinations.]  Doctor Visits Discussed/Reviewed Doctor Visits Discussed, Specialist, Doctor Visits Reviewed, Annual Wellness Visits, PCP  [Encouraged Routine Engagement with Care Team Members & Providers.]  Health Screening Bone Density, Colonoscopy, Mammogram  [Encouraged Routine Health Screenings.]  Durable Medical Equipment (DME) BP Cuff, Other  [Cane, Prescription Eyeglasses & Hand-Held Shower Hose.]  PCP/Specialist Visits  Compliance with follow-up visit  [Encouraged Routine Engagement with Care Team Members & Providers.]  Communication with PCP/Specialists, RN, Pharmacists, Social Work  [employer] Routine Engagement with Care Team Members & Providers.]  Level of Care Adult Daycare, Air Traffic Controller, Assisted Living, Skilled Nursing Facility  [Confirmed Disinterest in Enrollment in Adult Day Care Program or Receiving Assistance Pursuing Higher Level of Care Placement Options (I.e Assisted Living Versus Skilled Nursing Facility).]  Applications Medicaid, Personal Care Services  [Confirmed Disinterest in Applying for Medicaid or Personal Care Services.]  Exercise Interventions   Exercise Discussed/Reviewed Exercise Discussed, Assistive device use and maintanence, Exercise Reviewed, Physical Activity, Weight Managment  [Encouraged Daily Exercise Regimen.]  Physical Activity Discussed/Reviewed Home Exercise Program (HEP), Physical Activity Discussed, PREP, Physical Activity Reviewed, Gym, Types of exercise  [Encouraged Increased Level of Activity & Exercise, as Tolerated.]  Weight Management Weight maintenance  [Encouraged Healthy Diet.]  Education Interventions   Education Provided Provided Education  [Thoroughly Reviewed Educational Material to Ensure Understanding & Entertain Questions.]  Provided Verbal Education On Nutrition, Mental Health/Coping with Illness, When to see the doctor, Foot Care, Eye Care, Applications, Labs, Blood Sugar Monitoring, Exercise, Medication, Development Worker, Community, [employer]  [employer] Consideration & Implementation of Educational Material.]  Labs Reviewed Hgb A1c  [Reviewed.]  Applications Medicaid, Personal Care Services  [Confirmed Disinterest in Applying for Medicaid or Personal Care Services.]  Mental Health Interventions   Mental Health Discussed/Reviewed Mental Health Discussed, Anxiety, Depression, Mental Health Reviewed, Grief and Loss, Substance Abuse, Coping Strategies, Crisis,  Suicide, Other  [Assessed Mental Health & Cognitive Status.]  Nutrition Interventions   Nutrition Discussed/Reviewed Nutrition Discussed, Nutrition Reviewed, Carbohydrate meal planning, Adding fruits and vegetables, Increasing proteins, Decreasing fats, Fluid intake, Portion sizes, Decreasing salt, Decreasing sugar intake  [Encouraged Low Sodium, Reduced  Sugar, Low-Fat Diet.]  Pharmacy Interventions   Pharmacy Dicussed/Reviewed Pharmacy Topics Discussed, Medications and their functions, Medication Adherence, Pharmacy Topics Reviewed, Affording Medications  [Confirmed Ability to Afford Prescription Medications.]  Medication Adherence --  [Confirmed Compliance with Prescription Medications.]  Safety Interventions   Safety Discussed/Reviewed Safety Discussed, Safety Reviewed  [Encouraged Routine Use of Assistive Devices & Durable Medical Equipment.]  Advanced Directive Interventions   Advanced Directives Discussed/Reviewed Advanced Directives Discussed, Advanced Directives Reviewed  [Encouraged Initation of Advanced Directives (Living Will & Healthcare Power of Corporate Treasurer), Offering to [employer], Assist with Completion, Make Copies & Scan into Electronic Medical Record.]      Active Listening & Reflection Utilized.  Verbalization of Feelings Encouraged.  Emotional Support Provided. Problem Solving Interventions Implemented.   Solution-Focused Strategies Employed. Acceptance & Commitment Therapy Performed. Cognitive Behavioral Therapy Initiated. Client-Centered Therapy Indicated. Encouraged Routine Engagement with Isabella Scott, Licensed Clinical Social Worker with [facility] [phone]), if You Have Questions, Need Assistance or If Additional Social Work Needs Are Identified Between Now & Our Next Follow-Up Outreach Call, Scheduled on 12/02/2023 at 9:45 AM.      SDOH assessments and interventions completed:  Yes.  Care Coordination Interventions:  Yes,  provided.   Follow up plan: Follow up call scheduled for 12/02/2023 at 9:45 am.   Encounter Outcome:  Patient Visit Completed.   Philippe Desanctis, BSW, MSW, Printmaker Social Work Case Set Designer Health  [facility], Population Health Direct Dial: [phone]  Fax: [phone] Email: [email or web address]: [email or web address]

## 2023-11-15 NOTE — Patient Instructions (Signed)
 Visit Information  Thank you for taking time to visit with me today. Please don't hesitate to contact me if I can be of assistance to you.   Following are the goals we discussed today:   Goals Addressed               This Visit's Progress     Receive Counseling & Supportive Services to Reduce & Manage Symptoms of Anxiety. (pt-stated)   On track     Care Coordination Interventions:   Interventions Today    Flowsheet Row Most Recent Value  Chronic Disease   Chronic disease during today's visit Other, Hypertension (HTN)  [Crohn's Disease, Graves' Disease, Anxiety, Hypothyroidism, Hyperthyroidism, Headaches, Symptoms Associated with Medication Adjustment for Hypothyroidism, Cryptococcosis, Fatigue.]  General Interventions   General Interventions Discussed/Reviewed General Interventions Discussed, Labs, Vaccines, Doctor Visits, Health Screening, Annual Foot Exam, General Interventions Reviewed, Annual Eye Exam, Durable Medical Equipment (DME), Lipid Profile, [employer], Level of Care, Communication with  [Encouraged Routine Engagement with Care Team Members & Providers.]  Labs Hgb A1c every 3 months, Kidney Function  [Encouraged Routine Labwork.]  Vaccines COVID-19, Flu, Pneumonia, RSV, Shingles, Tetanus/Pertussis/Diphtheria  [Encouraged Routine Vaccinations.]  Doctor Visits Discussed/Reviewed Doctor Visits Discussed, Specialist, Doctor Visits Reviewed, Annual Wellness Visits, PCP  [Encouraged Routine Engagement with Care Team Members & Providers.]  Health Screening Bone Density, Colonoscopy, Mammogram  [Encouraged Routine Health Screenings.]  Durable Medical Equipment (DME) BP Cuff, Other  [Cane, Prescription Eyeglasses & Hand-Held Shower Hose.]  PCP/Specialist Visits Compliance with follow-up visit  [Encouraged Routine Engagement with Care Team Members & Providers.]  Communication with PCP/Specialists, RN, Pharmacists, Social Work  [employer] Routine Engagement with Care Team  Members & Providers.]  Level of Care Adult Daycare, Air Traffic Controller, Assisted Living, Skilled Nursing Facility  [Confirmed Disinterest in Enrollment in Adult Day Care Program or Receiving Assistance Pursuing Higher Level of Care Placement Options (I.e Assisted Living Versus Skilled Nursing Facility).]  Applications Medicaid, Personal Care Services  [Confirmed Disinterest in Applying for Medicaid or Personal Care Services.]  Exercise Interventions   Exercise Discussed/Reviewed Exercise Discussed, Assistive device use and maintanence, Exercise Reviewed, Physical Activity, Weight Managment  [Encouraged Daily Exercise Regimen.]  Physical Activity Discussed/Reviewed Home Exercise Program (HEP), Physical Activity Discussed, PREP, Physical Activity Reviewed, Gym, Types of exercise  [Encouraged Increased Level of Activity & Exercise, as Tolerated.]  Weight Management Weight maintenance  [Encouraged Healthy Diet.]  Education Interventions   Education Provided Provided Education  [Thoroughly Reviewed Educational Material to Ensure Understanding & Entertain Questions.]  Provided Verbal Education On Nutrition, Mental Health/Coping with Illness, When to see the doctor, Foot Care, Eye Care, Applications, Labs, Blood Sugar Monitoring, Exercise, Medication, Development Worker, Community, [employer]  [employer] Consideration & Implementation of Educational Material.]  Labs Reviewed Hgb A1c  [Reviewed.]  Applications Medicaid, Personal Care Services  [Confirmed Disinterest in Applying for Medicaid or Personal Care Services.]  Mental Health Interventions   Mental Health Discussed/Reviewed Mental Health Discussed, Anxiety, Depression, Mental Health Reviewed, Grief and Loss, Substance Abuse, Coping Strategies, Crisis, Suicide, Other  [Assessed Mental Health & Cognitive Status.]  Nutrition Interventions   Nutrition Discussed/Reviewed Nutrition Discussed, Nutrition Reviewed, Carbohydrate meal planning, Adding fruits and  vegetables, Increasing proteins, Decreasing fats, Fluid intake, Portion sizes, Decreasing salt, Decreasing sugar intake  [Encouraged Low Sodium, Reduced Sugar, Low-Fat Diet.]  Pharmacy Interventions   Pharmacy Dicussed/Reviewed Pharmacy Topics Discussed, Medications and their functions, Medication Adherence, Pharmacy Topics Reviewed, Affording Medications  [Confirmed Ability to Afford Prescription Medications.]  Medication Adherence --  [name]  Compliance with Prescription Medications.]  Safety Interventions   Safety Discussed/Reviewed Safety Discussed, Safety Reviewed  [Encouraged Routine Use of Assistive Devices & Durable Medical Equipment.]  Advanced Directive Interventions   Advanced Directives Discussed/Reviewed Advanced Directives Discussed, Advanced Directives Reviewed  [Encouraged Initation of Advanced Directives (Living Will & Healthcare Power of Corporate Treasurer), Offering to [employer], Assist with Completion, Make Copies & Scan into Electronic Medical Record.]      Active Listening & Reflection Utilized.  Verbalization of Feelings Encouraged.  Emotional Support Provided. Problem Solving Interventions Implemented.   Solution-Focused Strategies Employed. Acceptance & Commitment Therapy Performed. Cognitive Behavioral Therapy Initiated. Client-Centered Therapy Indicated. Encouraged Routine Engagement with Isabella Scott, Licensed Clinical Social Worker with [facility] [phone]), if You Have Questions, Need Assistance or If Additional Social Work Needs Are Identified Between Now & Our Next Follow-Up Outreach Call, Scheduled on 12/02/2023 at 9:45 AM.      Our next appointment is by telephone on 12/02/2023 at 9:45 am.   Please call the care guide team at [phone] if you need to cancel or reschedule your appointment.   If you are experiencing a Mental Health or Behavioral Health Crisis or need someone to talk to, please call the Suicide and Crisis  Lifeline: 988 call the [place]  National Suicide Prevention Lifeline: 5626599082 or TTY: 224-133-0787 TTY (431)829-4996) to talk to a trained counselor call 1-800-273-TALK (toll free, 24 hour hotline) go to [facility] [address], [place] [phone]) call the [facility] Crisis Line: ([phone] call 911  Patient verbalizes understanding of instructions and care plan provided today and agrees to view in MyChart. Active MyChart status and patient understanding of how to access instructions and care plan via MyChart confirmed with patient.     Telephone follow up appointment with care management team member scheduled for:   12/02/2023 at 9:45 am.   Philippe Desanctis, BSW, MSW, LCSW  Embedded Practice Social Work Case Manager  [facility], Population Health Direct Dial: [phone]  Fax: [phone] Email: [email or web address] Website: [email or web address]

## 2023-11-20 ENCOUNTER — Ambulatory Visit: Payer: Self-pay | Admitting: *Deleted

## 2023-11-20 NOTE — Patient Outreach (Signed)
  Care Coordination   11/20/2023 Name: Isabella Scott MRN: [id number] DOB: 08/22/1956   Care Coordination Outreach Attempts:  An unsuccessful outreach was attempted for an appointment today. Patient answered the phone but stated she couldn't talk right now and would need to call me back and then the call disconnected.   Follow Up Plan:  Additional outreach attempts will be made to offer the patient complex care management information and services.   Encounter Outcome:  Patient Request to Call Back   Care Coordination Interventions:  No, not indicated    Josette Pellet, RN, BSN Care Manager [place]  [facility]  Population Health  Direct Dial: [phone] Main #: [phone]

## 2023-11-26 DIAGNOSIS — R002 Palpitations: Secondary | ICD-10-CM | POA: Diagnosis not present

## 2023-11-26 DIAGNOSIS — R519 Headache, unspecified: Secondary | ICD-10-CM | POA: Diagnosis not present

## 2023-11-26 DIAGNOSIS — I10 Essential (primary) hypertension: Secondary | ICD-10-CM | POA: Diagnosis not present

## 2023-11-26 DIAGNOSIS — Z6824 Body mass index (BMI) 24.0-24.9, adult: Secondary | ICD-10-CM | POA: Diagnosis not present

## 2023-11-26 DIAGNOSIS — E039 Hypothyroidism, unspecified: Secondary | ICD-10-CM | POA: Diagnosis not present

## 2023-11-28 DIAGNOSIS — E890 Postprocedural hypothyroidism: Secondary | ICD-10-CM | POA: Diagnosis not present

## 2023-12-02 ENCOUNTER — Ambulatory Visit: Payer: Self-pay | Admitting: *Deleted

## 2023-12-02 NOTE — Patient Instructions (Signed)
Visit Information  Thank you for taking time to visit with me today. Please don't hesitate to contact me if I can be of assistance to you.   Following are the goals we discussed today:   Goals Addressed               This Visit's Progress     Receive Counseling & Supportive Services to Reduce & Manage Symptoms of Anxiety. (pt-stated)   On track     Care Coordination Interventions:   Interventions Today    Flowsheet Row Most Recent Value  Chronic Disease   Chronic disease during today's visit Other, Hypertension (HTN)  [Crohn's Disease, Graves' Disease, Anxiety, Hypothyroidism, Hyperthyroidism, Headaches, Symptoms Associated with Medication Adjustment for Hypothyroidism, Cryptococcosis, Fatigue.]  General Interventions   General Interventions Discussed/Reviewed General Interventions Discussed, Labs, Vaccines, Doctor Visits, Health Screening, Annual Foot Exam, General Interventions Reviewed, Annual Eye Exam, Durable Medical Equipment (DME), Lipid Profile, [employer], Level of Care, Communication with  [Encouraged Routine Engagement with Care Team Members & Providers.]  Labs Hgb A1c every 3 months, Kidney Function  [Encouraged Routine Labwork.]  Vaccines COVID-19, Flu, Pneumonia, RSV, Shingles, Tetanus/Pertussis/Diphtheria  [Encouraged Routine Vaccinations.]  Doctor Visits Discussed/Reviewed Doctor Visits Discussed, Specialist, Doctor Visits Reviewed, Annual Wellness Visits, PCP  [Encouraged Routine Engagement with Care Team Members & Providers.]  Health Screening Bone Density, Colonoscopy, Mammogram  [Encouraged Routine Health Screenings.]  Durable Medical Equipment (DME) BP Cuff, Other  [Cane, Prescription Eyeglasses & Hand-Held Shower Hose.]  PCP/Specialist Visits Compliance with follow-up visit  [Encouraged Routine Engagement with Care Team Members & Providers.]  Communication with PCP/Specialists, RN, Pharmacists, Social Work  [employer] Routine Engagement with Care Team  Members & Providers.]  Level of Care Adult Daycare, Air traffic controller, Assisted Living, Skilled Nursing Facility  [Confirmed Disinterest in Enrollment in Adult Day Care Program or Receiving Assistance Pursuing Higher Level of Care Placement Options (I.e Assisted Living Versus Skilled Nursing Facility).]  Applications Medicaid, Personal Care Services  [Confirmed Disinterest in Applying for Medicaid or Personal Care Services.]  Exercise Interventions   Exercise Discussed/Reviewed Exercise Discussed, Assistive device use and maintanence, Exercise Reviewed, Physical Activity, Weight Managment  [Encouraged Daily Exercise Regimen.]  Physical Activity Discussed/Reviewed Home Exercise Program (HEP), Physical Activity Discussed, PREP, Physical Activity Reviewed, Gym, Types of exercise  [Encouraged Increased Level of Activity & Exercise, as Tolerated.]  Weight Management Weight maintenance  [Encouraged Healthy Diet.]  Education Interventions   Education Provided Provided Education  [Thoroughly Reviewed Educational Material to Ensure Understanding & Entertain Questions.]  Provided Verbal Education On Nutrition, Mental Health/Coping with Illness, When to see the doctor, Foot Care, Eye Care, Applications, Labs, Blood Sugar Monitoring, Exercise, Medication, Development worker, community, [employer]  [employer] Consideration & Implementation of Educational Material.]  Labs Reviewed Hgb A1c  [Reviewed.]  Applications Medicaid, Personal Care Services  [Confirmed Disinterest in Applying for Medicaid or Personal Care Services.]  Mental Health Interventions   Mental Health Discussed/Reviewed Mental Health Discussed, Anxiety, Depression, Mental Health Reviewed, Grief and Loss, Substance Abuse, Coping Strategies, Crisis, Suicide, Other  [Assessed Mental Health & Cognitive Status.]  Nutrition Interventions   Nutrition Discussed/Reviewed Nutrition Discussed, Nutrition Reviewed, Carbohydrate meal planning, Adding fruits and  vegetables, Increasing proteins, Decreasing fats, Fluid intake, Portion sizes, Decreasing salt, Decreasing sugar intake  [Encouraged Low Sodium, Reduced Sugar, Low-Fat Diet.]  Pharmacy Interventions   Pharmacy Dicussed/Reviewed Pharmacy Topics Discussed, Medications and their functions, Medication Adherence, Pharmacy Topics Reviewed, Affording Medications  [Confirmed Ability to Afford Prescription Medications.]  Medication Adherence --  Edilia Bo  Compliance with Prescription Medications.]  Safety Interventions   Safety Discussed/Reviewed Safety Discussed, Safety Reviewed  [Encouraged Routine Use of Assistive Devices & Durable Medical Equipment.]  Advanced Directive Interventions   Advanced Directives Discussed/Reviewed Advanced Directives Discussed, Advanced Directives Reviewed  [Encouraged Initation of Advanced Directives (Living Will & Healthcare Power of Corporate treasurer), Offering to [employer], Assist with Completion, Make Copies & Scan into Electronic Medical Record.]      Active Listening & Reflection Utilized.  Verbalization of Feelings Encouraged.  Emotional Support Provided. Problem Solving Interventions Implemented.   Solution-Focused Strategies Employed. Acceptance & Commitment Therapy Performed. Cognitive Behavioral Therapy Initiated. Client-Centered Therapy Indicated. CSW Collaboration with Dr. Fara Chute, Primary Care Provider with [facility] [phone]), Via Phone Conversation with Nurse, to Report Symptoms of Hypothyroidism, Requesting to Reduce Levothyroxine (Synthroid) Dosage from 125 MCG Tablet, PO, Daily to 112 MCG Tablet, PO, Daily. CSW Collaboration with Dr. Fara Chute, Primary Care Provider with [facility] [phone]), Via Phone Conversation with Nurse, to Report Symptoms of Anxiety, Requesting to Increase Lorazapam (Ativan) Dosage from 0.5 MG Tablet, PO, Three Times Daily to 1 MG Tablet, PO, Three Times Daily. CSW  Collaboration with Dr. Crista Curb, Endocrinologist with [facility] [phone]), Via Phone Conversation with Nurse, to Request Lab Results from 11/28/2023 Be Faxed to Dr. Fara Chute, Primary Care Provider with [facility] [phone]), for Review.  Encouraged Routine Engagement with Danford Bad, Licensed Clinical Social Worker with [facility] [phone]), if You Have Questions, Need Assistance or If Additional Social Work Needs Are Identified Between Now & Our Next Follow-Up Outreach Call, Scheduled on 12/16/2023 at 10:45 AM.      Our next appointment is by telephone on 12/16/2023 at 10:45 am.  Please call the care guide team at [phone] if you need to cancel or reschedule your appointment.   If you are experiencing a Mental Health or Behavioral Health Crisis or need someone to talk to, please call the Suicide and Crisis Lifeline: 988 call the [place] National Suicide Prevention Lifeline: 432 461 6363 or TTY: 9406409812 TTY 516-597-2381) to talk to a trained counselor call 1-800-273-TALK (toll free, 24 hour hotline) go to [facility] [address], [place] [phone]) call the [facility] Crisis Line: 626-403-4542 call 911  Patient verbalizes understanding of instructions and care plan provided today and agrees to view in MyChart. Active MyChart status and patient understanding of how to access instructions and care plan via MyChart confirmed with patient.     Telephone follow up appointment with care management team member scheduled for:  12/16/2023 at 10:45 am.   Danford Bad, BSW, MSW, LCSW [place]  [facility], [facility] Clinical Social Worker II Direct Dial: [phone]  Fax: [phone] Website: [email or web address]

## 2023-12-02 NOTE — Patient Outreach (Signed)
Care Coordination   Follow Up Visit Note   12/02/2023  Name: Isabella Scott MRN: [id number] DOB: [birth year]  Isabella Scott is a 68 y.o. year old female who sees Sasser, Clarene Critchley, MD for primary care. I spoke with Tresa Endo by phone today.  What matters to the patients health and wellness today?  Receive Counseling & Supportive Services to Reduce & Manage Symptoms of Anxiety.    Goals Addressed               This Visit's Progress     Receive Counseling & Supportive Services to Reduce & Manage Symptoms of Anxiety. (pt-stated)   On track     Care Coordination Interventions:   Interventions Today    Flowsheet Row Most Recent Value  Chronic Disease   Chronic disease during today's visit Other, Hypertension (HTN)  [Crohn's Disease, Graves' Disease, Anxiety, Hypothyroidism, Hyperthyroidism, Headaches, Symptoms Associated with Medication Adjustment for Hypothyroidism, Cryptococcosis, Fatigue.]  General Interventions   General Interventions Discussed/Reviewed General Interventions Discussed, Labs, Vaccines, Doctor Visits, Health Screening, Annual Foot Exam, General Interventions Reviewed, Annual Eye Exam, Durable Medical Equipment (DME), Lipid Profile, [employer], Level of Care, Communication with  [Encouraged Routine Engagement with Care Team Members & Providers.]  Labs Hgb A1c every 3 months, Kidney Function  [Encouraged Routine Labwork.]  Vaccines COVID-19, Flu, Pneumonia, RSV, Shingles, Tetanus/Pertussis/Diphtheria  [Encouraged Routine Vaccinations.]  Doctor Visits Discussed/Reviewed Doctor Visits Discussed, Specialist, Doctor Visits Reviewed, Annual Wellness Visits, PCP  [Encouraged Routine Engagement with Care Team Members & Providers.]  Health Screening Bone Density, Colonoscopy, Mammogram  [Encouraged Routine Health Screenings.]  Durable Medical Equipment (DME) BP Cuff, Other  [Cane, Prescription Eyeglasses & Hand-Held Shower Hose.]  PCP/Specialist Visits  Compliance with follow-up visit  [Encouraged Routine Engagement with Care Team Members & Providers.]  Communication with PCP/Specialists, RN, Pharmacists, Social Work  [employer] Routine Engagement with Care Team Members & Providers.]  Level of Care Adult Daycare, Air traffic controller, Assisted Living, Skilled Nursing Facility  [Confirmed Disinterest in Enrollment in Adult Day Care Program or Receiving Assistance Pursuing Higher Level of Care Placement Options (I.e Assisted Living Versus Skilled Nursing Facility).]  Applications Medicaid, Personal Care Services  [Confirmed Disinterest in Applying for Medicaid or Personal Care Services.]  Exercise Interventions   Exercise Discussed/Reviewed Exercise Discussed, Assistive device use and maintanence, Exercise Reviewed, Physical Activity, Weight Managment  [Encouraged Daily Exercise Regimen.]  Physical Activity Discussed/Reviewed Home Exercise Program (HEP), Physical Activity Discussed, PREP, Physical Activity Reviewed, Gym, Types of exercise  [Encouraged Increased Level of Activity & Exercise, as Tolerated.]  Weight Management Weight maintenance  [Encouraged Healthy Diet.]  Education Interventions   Education Provided Provided Education  [Thoroughly Reviewed Educational Material to Ensure Understanding & Entertain Questions.]  Provided Verbal Education On Nutrition, Mental Health/Coping with Illness, When to see the doctor, Foot Care, Eye Care, Applications, Labs, Blood Sugar Monitoring, Exercise, Medication, Development worker, community, [employer]  [employer] Consideration & Implementation of Educational Material.]  Labs Reviewed Hgb A1c  [Reviewed.]  Applications Medicaid, Personal Care Services  [Confirmed Disinterest in Applying for Medicaid or Personal Care Services.]  Mental Health Interventions   Mental Health Discussed/Reviewed Mental Health Discussed, Anxiety, Depression, Mental Health Reviewed, Grief and Loss, Substance Abuse, Coping Strategies, Crisis,  Suicide, Other  [Assessed Mental Health & Cognitive Status.]  Nutrition Interventions   Nutrition Discussed/Reviewed Nutrition Discussed, Nutrition Reviewed, Carbohydrate meal planning, Adding fruits and vegetables, Increasing proteins, Decreasing fats, Fluid intake, Portion sizes, Decreasing salt, Decreasing sugar intake  [Encouraged Low Sodium, Reduced  Sugar, Low-Fat Diet.]  Pharmacy Interventions   Pharmacy Dicussed/Reviewed Pharmacy Topics Discussed, Medications and their functions, Medication Adherence, Pharmacy Topics Reviewed, Affording Medications  [Confirmed Ability to Afford Prescription Medications.]  Medication Adherence --  [Confirmed Compliance with Prescription Medications.]  Safety Interventions   Safety Discussed/Reviewed Safety Discussed, Safety Reviewed  [Encouraged Routine Use of Assistive Devices & Durable Medical Equipment.]  Advanced Directive Interventions   Advanced Directives Discussed/Reviewed Advanced Directives Discussed, Advanced Directives Reviewed  [Encouraged Initation of Advanced Directives (Living Will & Healthcare Power of Corporate treasurer), Offering to [employer], Assist with Completion, Make Copies & Scan into Electronic Medical Record.]      Active Listening & Reflection Utilized.  Verbalization of Feelings Encouraged.  Emotional Support Provided. Problem Solving Interventions Implemented.   Solution-Focused Strategies Employed. Acceptance & Commitment Therapy Performed. Cognitive Behavioral Therapy Initiated. Client-Centered Therapy Indicated. CSW Collaboration with Dr. Fara Chute, Primary Care Provider with [facility] [phone]), Via Phone Conversation with Nurse, to Report Symptoms of Hypothyroidism, Requesting to Reduce Levothyroxine (Synthroid) Dosage from 125 MCG Tablet, PO, Daily to 112 MCG Tablet, PO, Daily. CSW Collaboration with Dr. Fara Chute, Primary Care Provider with [facility] [phone]), Via  Phone Conversation with Nurse, to Report Symptoms of Anxiety, Requesting to Increase Lorazapam (Ativan) Dosage from 0.5 MG Tablet, PO, Three Times Daily to 1 MG Tablet, PO, Three Times Daily. CSW Collaboration with Dr. Crista Curb, Endocrinologist with [facility] [phone]), Via Phone Conversation with Nurse, to Request Lab Results from 11/28/2023 Be Faxed to Dr. Fara Chute, Primary Care Provider with [facility] [phone]), for Review.  Encouraged Routine Engagement with Danford Bad, Licensed Clinical Social Worker with [facility] [phone]), if You Have Questions, Need Assistance or If Additional Social Work Needs Are Identified Between Now & Our Next Follow-Up Outreach Call, Scheduled on 12/16/2023 at 10:45 AM.      SDOH assessments and interventions completed:  Yes.  Care Coordination Interventions:  Yes, provided.   Follow up plan: Follow up call scheduled for 12/16/2023 at 10:45 am.  Encounter Outcome:  Patient Visit Completed.    Danford Bad, BSW, MSW, LCSW [facility], [facility] Clinical Social Worker II Direct Dial: [phone]  Fax: [phone] Website: [email or web address]

## 2023-12-16 ENCOUNTER — Encounter: Payer: Self-pay | Admitting: *Deleted

## 2023-12-16 ENCOUNTER — Ambulatory Visit: Payer: Self-pay | Admitting: *Deleted

## 2023-12-16 NOTE — Patient Outreach (Signed)
Care Coordination   Follow Up Visit Note   12/16/2023  Name: Isabella Scott MRN: [id number] DOB: [birth year]  Isabella Scott is a 68 y.o. year old female who sees Sasser, Clarene Critchley, MD for primary care. I spoke with Tresa Endo by phone today.  What matters to the patients health and wellness today?  Receive Counseling & Supportive Services to Reduce & Manage Symptoms of Anxiety.    Goals Addressed               This Visit's Progress     Receive Counseling & Supportive Services to Reduce & Manage Symptoms of Anxiety. (pt-stated)   On track     Care Coordination Interventions:  Interventions Today    Flowsheet Row Most Recent Value  Chronic Disease   Chronic disease during today's visit Hypertension (HTN), Other  [Crohn's Disease, Graves' Disease, Anxiety, Hypothyroidism, Hyperthyroidism, Headaches, Symptoms Associated with Medication Adjustment for Hypothyroidism, Cryptococcosis, Fatigue.]  General Interventions   General Interventions Discussed/Reviewed [employer], Communication with  [Encouraged Routine Engagement with Care Team Members & Providers.]  Doctor Visits Discussed/Reviewed Doctor Visits Discussed, Specialist, Doctor Visits Reviewed, Annual Wellness Visits, PCP  [Encouraged Routine Engagement with Care Team Members & Providers.]  PCP/Specialist Visits Compliance with follow-up visit  [Encouraged Routine Engagement with Care Team Members & Providers.]  Communication with PCP/Specialists, Charity fundraiser, Pharmacists, Social Work  [employer] Routine Engagement with Care Team Members & Providers.]  Level of Care Adult Daycare, Assisted Living, Skilled Nursing Facility  [Confirmed Disinterest in Enrollment in Adult Day Care Program or Receiving Assistance Pursuing Higher Level of Care Placement Options (I.e Assisted Living Versus Skilled Nursing Facility).]  Applications Medicaid, Personal Care Services  [Confirmed Disinterest in Applying for Medicaid or Personal Care  Services.]  Education Interventions   Applications Medicaid, Personal Care Services  [Confirmed Disinterest in Applying for Medicaid or Personal Care Services.]  Mental Health Interventions   Mental Health Discussed/Reviewed Mental Health Discussed, Anxiety, Depression, Grief and Loss, Mental Health Reviewed, Substance Abuse, Coping Strategies, Suicide, Crisis, Other  [Assessed Mental Health & Cognitive Status.]  Pharmacy Interventions   Pharmacy Dicussed/Reviewed --  [CSW Collaboration with Primary Care Provider to Inquire About Changing Anti-Anxiety Medication, Per Your Request.]      Active Listening & Reflection Utilized.  Verbalization of Feelings Encouraged.  Emotional Support Provided. Cognitive Behavioral Therapy Performed. Encouraged Routine Engagement with Danford Bad, Licensed Clinical Social Worker with [facility], [facility] [phone]), if You Have Questions, Need Assistance or If Additional Social Work Needs Are Identified Between Now & Our Next Follow-Up Outreach Call, Scheduled on 01/10/2024 at 10:30 AM.        SDOH assessments and interventions completed:  Yes.  SDOH Interventions Today    Flowsheet Row Most Recent Value  SDOH Interventions   Food Insecurity Interventions Intervention Not Indicated  Housing Interventions Intervention Not Indicated  Transportation Interventions Intervention Not Indicated, Patient Resources (Friends/Family)  Utilities Interventions Intervention Not Indicated  Alcohol Usage Interventions Intervention Not Indicated (Score <7)  Financial Strain Interventions Intervention Not Indicated  Physical Activity Interventions Intervention Not Indicated  Stress Interventions Intervention Not Indicated  Social Connections Interventions Intervention Not Indicated, Community Resources Provided  Health Literacy Interventions Intervention Not Indicated     Care Coordination Interventions:  Yes, provided.    Follow up plan: Follow up call scheduled for 01/10/2024 at 10:30 am.  Encounter Outcome:  Patient Visit Completed.    Danford Bad, BSW, MSW, LCSW [place]  [facility], [facility] Clinical Social Worker II Direct Dial: [phone]  Fax: [phone] Website: [email or web address]

## 2023-12-16 NOTE — Patient Instructions (Signed)
Visit Information  Thank you for taking time to visit with me today. Please don't hesitate to contact me if I can be of assistance to you.   Following are the goals we discussed today:   Goals Addressed               This Visit's Progress     Receive Counseling & Supportive Services to Reduce & Manage Symptoms of Anxiety. (pt-stated)   On track     Care Coordination Interventions:  Interventions Today    Flowsheet Row Most Recent Value  Chronic Disease   Chronic disease during today's visit Hypertension (HTN), Other  [Crohn's Disease, Graves' Disease, Anxiety, Hypothyroidism, Hyperthyroidism, Headaches, Symptoms Associated with Medication Adjustment for Hypothyroidism, Cryptococcosis, Fatigue.]  General Interventions   General Interventions Discussed/Reviewed [employer], Communication with  [Encouraged Routine Engagement with Care Team Members & Providers.]  Doctor Visits Discussed/Reviewed Doctor Visits Discussed, Specialist, Doctor Visits Reviewed, Annual Wellness Visits, PCP  [Encouraged Routine Engagement with Care Team Members & Providers.]  PCP/Specialist Visits Compliance with follow-up visit  [Encouraged Routine Engagement with Care Team Members & Providers.]  Communication with PCP/Specialists, Charity fundraiser, Pharmacists, Social Work  [employer] Routine Engagement with Care Team Members & Providers.]  Level of Care Adult Daycare, Assisted Living, Skilled Nursing Facility  [Confirmed Disinterest in Enrollment in Adult Day Care Program or Receiving Assistance Pursuing Higher Level of Care Placement Options (I.e Assisted Living Versus Skilled Nursing Facility).]  Applications Medicaid, Personal Care Services  [Confirmed Disinterest in Applying for Medicaid or Personal Care Services.]  Education Interventions   Applications Medicaid, Personal Care Services  [Confirmed Disinterest in Applying for Medicaid or Personal Care Services.]  Mental Health Interventions   Mental Health  Discussed/Reviewed Mental Health Discussed, Anxiety, Depression, Grief and Loss, Mental Health Reviewed, Substance Abuse, Coping Strategies, Suicide, Crisis, Other  [Assessed Mental Health & Cognitive Status.]  Pharmacy Interventions   Pharmacy Dicussed/Reviewed --  [CSW Collaboration with Primary Care Provider to Inquire About Changing Anti-Anxiety Medication, Per Your Request.]      Active Listening & Reflection Utilized.  Verbalization of Feelings Encouraged.  Emotional Support Provided. Cognitive Behavioral Therapy Performed. Encouraged Routine Engagement with Danford Bad, Licensed Clinical Social Worker with [facility], [facility] [phone]), if You Have Questions, Need Assistance or If Additional Social Work Needs Are Identified Between Now & Our Next Follow-Up Outreach Call, Scheduled on 01/10/2024 at 10:30 AM.      Our next appointment is by telephone on 01/10/2024 at 10:30 am.  Please call the care guide team at [phone] if you need to cancel or reschedule your appointment.   If you are experiencing a Mental Health or Behavioral Health Crisis or need someone to talk to, please call the Suicide and Crisis Lifeline: 988 call the [place] National Suicide Prevention Lifeline: 267-542-0272 or TTY: (405)703-9673 TTY (304)036-5337) to talk to a trained counselor call 1-800-273-TALK (toll free, 24 hour hotline) go to [facility] [address], [place] [phone]) call the [facility] Crisis Line: [phone] call 911  Patient verbalizes understanding of instructions and care plan provided today and agrees to view in MyChart. Active MyChart status and patient understanding of how to access instructions and care plan via MyChart confirmed with patient.     Telephone follow up appointment with care management team member scheduled for:  01/10/2024 at 10:30 am.   Danford Bad, BSW, MSW,  LCSW [place]  [facility], [facility] Clinical Social Worker II  Direct Dial: [phone]  Fax: [phone] Website: [email or web address]

## 2023-12-25 DIAGNOSIS — M810 Age-related osteoporosis without current pathological fracture: Secondary | ICD-10-CM | POA: Diagnosis not present

## 2023-12-25 DIAGNOSIS — Z780 Asymptomatic menopausal state: Secondary | ICD-10-CM | POA: Diagnosis not present

## 2023-12-30 DIAGNOSIS — E039 Hypothyroidism, unspecified: Secondary | ICD-10-CM | POA: Diagnosis not present

## 2024-01-08 DIAGNOSIS — E039 Hypothyroidism, unspecified: Secondary | ICD-10-CM | POA: Diagnosis not present

## 2024-01-08 DIAGNOSIS — R519 Headache, unspecified: Secondary | ICD-10-CM | POA: Diagnosis not present

## 2024-01-08 DIAGNOSIS — K50918 Crohn's disease, unspecified, with other complication: Secondary | ICD-10-CM | POA: Diagnosis not present

## 2024-01-08 DIAGNOSIS — Z6825 Body mass index (BMI) 25.0-25.9, adult: Secondary | ICD-10-CM | POA: Diagnosis not present

## 2024-01-08 DIAGNOSIS — I10 Essential (primary) hypertension: Secondary | ICD-10-CM | POA: Diagnosis not present

## 2024-01-10 ENCOUNTER — Ambulatory Visit: Payer: Self-pay | Admitting: *Deleted

## 2024-01-10 DIAGNOSIS — K50918 Crohn's disease, unspecified, with other complication: Secondary | ICD-10-CM | POA: Diagnosis not present

## 2024-01-10 DIAGNOSIS — G43909 Migraine, unspecified, not intractable, without status migrainosus: Secondary | ICD-10-CM | POA: Diagnosis not present

## 2024-01-10 DIAGNOSIS — E039 Hypothyroidism, unspecified: Secondary | ICD-10-CM | POA: Diagnosis not present

## 2024-01-10 NOTE — Patient Outreach (Signed)
 Care Coordination   Follow Up Visit Note   01/10/2024  Name: Isabella Scott MRN: [id number] DOB: Dec 31, 1955  Isabella Scott is a 68 y.o. year old female who sees Sasser, Clarene Critchley, MD for primary care. I spoke with Tresa Endo by phone today.  What matters to the patients health and wellness today?  Receive Counseling & Supportive Services to Reduce & Manage Symptoms of Anxiety.    Goals Addressed               This Visit's Progress     COMPLETED: Receive Counseling & Supportive Services to Reduce & Manage Symptoms of Anxiety. (pt-stated)   On track     Care Coordination Interventions:  Interventions Today    Flowsheet Row Most Recent Value  Chronic Disease   Chronic disease during today's visit Hypertension (HTN), Other  [Crohn's Disease, Graves' Disease, Anxiety, Hypothyroidism, Hyperthyroidism, Headaches, Symptoms Associated with Medication Adjustment for Hypothyroidism, Cryptococcosis, Fatigue.]  General Interventions   General Interventions Discussed/Reviewed [employer], Communication with  [Encouraged Routine Engagement with Care Team Members & Providers.]  Doctor Visits Discussed/Reviewed Doctor Visits Discussed, Specialist, Doctor Visits Reviewed, Annual Wellness Visits, PCP  [Encouraged Routine Engagement with Care Team Members & Providers.]  PCP/Specialist Visits Compliance with follow-up visit  [Encouraged Routine Engagement with Care Team Members & Providers.]  Communication with PCP/Specialists, Charity fundraiser, Pharmacists, Social Work  [employer] Routine Engagement with Care Team Members & Providers.]  Level of Care Adult Daycare, Assisted Living, Skilled Nursing Facility  [Confirmed Disinterest in Enrollment in Adult Day Care Program or Receiving Assistance Pursuing Higher Level of Care Placement Options (I.e Assisted Living Versus Skilled Nursing Facility).]  Applications Medicaid, Personal Care Services  [Confirmed Disinterest in Applying for Medicaid or Personal  Care Services.]  Education Interventions   Applications Medicaid, Personal Care Services  [Confirmed Disinterest in Applying for Medicaid or Personal Care Services.]  Mental Health Interventions   Mental Health Discussed/Reviewed Mental Health Discussed, Anxiety, Depression, Grief and Loss, Mental Health Reviewed, Substance Abuse, Coping Strategies, Suicide, Crisis, Other  [Assessed Mental Health & Cognitive Status.]  Pharmacy Interventions   Pharmacy Dicussed/Reviewed --  [CSW Collaboration with Primary Care Provider to Inquire About Changing Anti-Anxiety Medication, Per Your Request.]      Active Listening & Reflection Utilized.  Verbalization of Feelings Encouraged.  Emotional Support Provided. Cognitive Behavioral Therapy Initiated. Client-Centered Therapy Performed. Acceptance & Commitment Therapy Indicated. Thyroid Support Groups Provided, Thoroughly Reviewed, & Self-Enrollment Encouraged. Encouraged Engagement with Danford Bad, Licensed Clinical Social Worker with [facility], [facility] [phone]), if You Have Questions, Need Assistance, Additional Social Work Needs Are Identified in The Near Future, or If You Change Your Mind About Wanting to Receive Social Work Services.       SDOH assessments and interventions completed:  Yes.  Care Coordination Interventions:  Yes, provided.   Follow up plan: No further intervention required.   Encounter Outcome:  Patient Visit Completed.   Danford Bad, BSW, MSW, LCSW [facility], [facility] Clinical Social Worker II Direct Dial: [phone]  Fax: [phone] Website: [email or web address]

## 2024-01-10 NOTE — Patient Instructions (Signed)
 Visit Information  Thank you for taking time to visit with me today. Please don't hesitate to contact me if I can be of assistance to you.   Following are the goals we discussed today:   Goals Addressed               This Visit's Progress     COMPLETED: Receive Counseling & Supportive Services to Reduce & Manage Symptoms of Anxiety. (pt-stated)   On track     Care Coordination Interventions:  Interventions Today    Flowsheet Row Most Recent Value  Chronic Disease   Chronic disease during today's visit Hypertension (HTN), Other  [Crohn's Disease, Graves' Disease, Anxiety, Hypothyroidism, Hyperthyroidism, Headaches, Symptoms Associated with Medication Adjustment for Hypothyroidism, Cryptococcosis, Fatigue.]  General Interventions   General Interventions Discussed/Reviewed [employer], Communication with  [Encouraged Routine Engagement with Care Team Members & Providers.]  Doctor Visits Discussed/Reviewed Doctor Visits Discussed, Specialist, Doctor Visits Reviewed, Annual Wellness Visits, PCP  [Encouraged Routine Engagement with Care Team Members & Providers.]  PCP/Specialist Visits Compliance with follow-up visit  [Encouraged Routine Engagement with Care Team Members & Providers.]  Communication with PCP/Specialists, Charity fundraiser, Pharmacists, Social Work  [employer] Routine Engagement with Care Team Members & Providers.]  Level of Care Adult Daycare, Assisted Living, Skilled Nursing Facility  [Confirmed Disinterest in Enrollment in Adult Day Care Program or Receiving Assistance Pursuing Higher Level of Care Placement Options (I.e Assisted Living Versus Skilled Nursing Facility).]  Applications Medicaid, Personal Care Services  [Confirmed Disinterest in Applying for Medicaid or Personal Care Services.]  Education Interventions   Applications Medicaid, Personal Care Services  [Confirmed Disinterest in Applying for Medicaid or Personal Care Services.]  Mental Health Interventions   Mental  Health Discussed/Reviewed Mental Health Discussed, Anxiety, Depression, Grief and Loss, Mental Health Reviewed, Substance Abuse, Coping Strategies, Suicide, Crisis, Other  [Assessed Mental Health & Cognitive Status.]  Pharmacy Interventions   Pharmacy Dicussed/Reviewed --  [CSW Collaboration with Primary Care Provider to Inquire About Changing Anti-Anxiety Medication, Per Your Request.]      Active Listening & Reflection Utilized.  Verbalization of Feelings Encouraged.  Emotional Support Provided. Cognitive Behavioral Therapy Initiated. Client-Centered Therapy Performed. Acceptance & Commitment Therapy Indicated. Thyroid Support Groups Provided, Thoroughly Reviewed, & Self-Enrollment Encouraged. Encouraged Engagement with Danford Bad, Licensed Clinical Social Worker with [facility], [facility] [phone]), if You Have Questions, Need Assistance, Additional Social Work Needs Are Identified in The Near Future, or If You Change Your Mind About Wanting to Receive Social Work Services.       Please call the care guide team at [phone] if you need to cancel or reschedule your appointment.   If you are experiencing a Mental Health or Behavioral Health Crisis or need someone to talk to, please call the Suicide and Crisis Lifeline: 988 call the [place] National Suicide Prevention Lifeline: 907-036-1975 or TTY: 5105580659 TTY 7261445407) to talk to a trained counselor call 1-800-273-TALK (toll free, 24 hour hotline) go to [facility] [address], [place] [phone]) call the [facility] Crisis Line: 979-857-7511 call 911  Patient verbalizes understanding of instructions and care plan provided today and agrees to view in MyChart. Active MyChart status and patient understanding of how to access instructions and care plan via MyChart confirmed with patient.     No further follow up  required.   Danford Bad, BSW, MSW, [employer]  [facility], [facility] Clinical Social Worker II Direct Dial: [phone]  Fax: [phone] Website: [email or web address]

## 2024-02-10 DIAGNOSIS — E039 Hypothyroidism, unspecified: Secondary | ICD-10-CM | POA: Diagnosis not present

## 2024-02-10 DIAGNOSIS — K50918 Crohn's disease, unspecified, with other complication: Secondary | ICD-10-CM | POA: Diagnosis not present

## 2024-02-10 DIAGNOSIS — G43909 Migraine, unspecified, not intractable, without status migrainosus: Secondary | ICD-10-CM | POA: Diagnosis not present

## 2024-03-04 DIAGNOSIS — L900 Lichen sclerosus et atrophicus: Secondary | ICD-10-CM | POA: Diagnosis not present

## 2024-03-04 DIAGNOSIS — N950 Postmenopausal bleeding: Secondary | ICD-10-CM | POA: Diagnosis not present

## 2024-03-04 DIAGNOSIS — N958 Other specified menopausal and perimenopausal disorders: Secondary | ICD-10-CM | POA: Diagnosis not present

## 2024-03-04 DIAGNOSIS — Z8719 Personal history of other diseases of the digestive system: Secondary | ICD-10-CM | POA: Diagnosis not present

## 2024-03-04 DIAGNOSIS — N823 Fistula of vagina to large intestine: Secondary | ICD-10-CM | POA: Diagnosis not present

## 2024-03-05 DIAGNOSIS — E039 Hypothyroidism, unspecified: Secondary | ICD-10-CM | POA: Diagnosis not present

## 2024-03-05 DIAGNOSIS — Z6825 Body mass index (BMI) 25.0-25.9, adult: Secondary | ICD-10-CM | POA: Diagnosis not present

## 2024-03-05 DIAGNOSIS — F410 Panic disorder [episodic paroxysmal anxiety] without agoraphobia: Secondary | ICD-10-CM | POA: Diagnosis not present

## 2024-03-05 DIAGNOSIS — R5383 Other fatigue: Secondary | ICD-10-CM | POA: Diagnosis not present

## 2024-03-05 DIAGNOSIS — R002 Palpitations: Secondary | ICD-10-CM | POA: Diagnosis not present

## 2024-03-05 DIAGNOSIS — Z1321 Encounter for screening for nutritional disorder: Secondary | ICD-10-CM | POA: Diagnosis not present

## 2024-03-05 DIAGNOSIS — I10 Essential (primary) hypertension: Secondary | ICD-10-CM | POA: Diagnosis not present

## 2024-03-05 DIAGNOSIS — D559 Anemia due to enzyme disorder, unspecified: Secondary | ICD-10-CM | POA: Diagnosis not present

## 2024-03-10 DIAGNOSIS — D259 Leiomyoma of uterus, unspecified: Secondary | ICD-10-CM | POA: Diagnosis not present

## 2024-03-10 DIAGNOSIS — R9389 Abnormal findings on diagnostic imaging of other specified body structures: Secondary | ICD-10-CM | POA: Diagnosis not present

## 2024-03-10 DIAGNOSIS — N950 Postmenopausal bleeding: Secondary | ICD-10-CM | POA: Diagnosis not present

## 2024-03-11 DIAGNOSIS — K50918 Crohn's disease, unspecified, with other complication: Secondary | ICD-10-CM | POA: Diagnosis not present

## 2024-03-11 DIAGNOSIS — G43909 Migraine, unspecified, not intractable, without status migrainosus: Secondary | ICD-10-CM | POA: Diagnosis not present

## 2024-03-11 DIAGNOSIS — E039 Hypothyroidism, unspecified: Secondary | ICD-10-CM | POA: Diagnosis not present

## 2024-03-31 DIAGNOSIS — Z1231 Encounter for screening mammogram for malignant neoplasm of breast: Secondary | ICD-10-CM | POA: Diagnosis not present

## 2024-04-08 DIAGNOSIS — E538 Deficiency of other specified B group vitamins: Secondary | ICD-10-CM | POA: Diagnosis not present

## 2024-04-08 DIAGNOSIS — D559 Anemia due to enzyme disorder, unspecified: Secondary | ICD-10-CM | POA: Diagnosis not present

## 2024-04-08 DIAGNOSIS — E559 Vitamin D deficiency, unspecified: Secondary | ICD-10-CM | POA: Diagnosis not present

## 2024-04-08 DIAGNOSIS — Z6826 Body mass index (BMI) 26.0-26.9, adult: Secondary | ICD-10-CM | POA: Diagnosis not present

## 2024-04-08 DIAGNOSIS — G629 Polyneuropathy, unspecified: Secondary | ICD-10-CM | POA: Diagnosis not present

## 2024-04-08 DIAGNOSIS — I10 Essential (primary) hypertension: Secondary | ICD-10-CM | POA: Diagnosis not present

## 2024-04-08 DIAGNOSIS — R002 Palpitations: Secondary | ICD-10-CM | POA: Diagnosis not present

## 2024-04-08 DIAGNOSIS — E039 Hypothyroidism, unspecified: Secondary | ICD-10-CM | POA: Diagnosis not present

## 2024-04-10 DIAGNOSIS — G43909 Migraine, unspecified, not intractable, without status migrainosus: Secondary | ICD-10-CM | POA: Diagnosis not present

## 2024-04-10 DIAGNOSIS — K50918 Crohn's disease, unspecified, with other complication: Secondary | ICD-10-CM | POA: Diagnosis not present

## 2024-04-10 DIAGNOSIS — E039 Hypothyroidism, unspecified: Secondary | ICD-10-CM | POA: Diagnosis not present

## 2024-04-29 DIAGNOSIS — R519 Headache, unspecified: Secondary | ICD-10-CM | POA: Diagnosis not present

## 2024-04-29 DIAGNOSIS — I10 Essential (primary) hypertension: Secondary | ICD-10-CM | POA: Diagnosis not present

## 2024-04-29 DIAGNOSIS — F410 Panic disorder [episodic paroxysmal anxiety] without agoraphobia: Secondary | ICD-10-CM | POA: Diagnosis not present

## 2024-04-29 DIAGNOSIS — Z6826 Body mass index (BMI) 26.0-26.9, adult: Secondary | ICD-10-CM | POA: Diagnosis not present

## 2024-04-29 DIAGNOSIS — E039 Hypothyroidism, unspecified: Secondary | ICD-10-CM | POA: Diagnosis not present

## 2024-05-11 DIAGNOSIS — G43909 Migraine, unspecified, not intractable, without status migrainosus: Secondary | ICD-10-CM | POA: Diagnosis not present

## 2024-05-11 DIAGNOSIS — K50918 Crohn's disease, unspecified, with other complication: Secondary | ICD-10-CM | POA: Diagnosis not present

## 2024-05-11 DIAGNOSIS — E039 Hypothyroidism, unspecified: Secondary | ICD-10-CM | POA: Diagnosis not present

## 2024-06-11 DIAGNOSIS — E039 Hypothyroidism, unspecified: Secondary | ICD-10-CM | POA: Diagnosis not present

## 2024-06-11 DIAGNOSIS — G43909 Migraine, unspecified, not intractable, without status migrainosus: Secondary | ICD-10-CM | POA: Diagnosis not present

## 2024-06-11 DIAGNOSIS — K50918 Crohn's disease, unspecified, with other complication: Secondary | ICD-10-CM | POA: Diagnosis not present

## 2024-06-18 DIAGNOSIS — R002 Palpitations: Secondary | ICD-10-CM | POA: Diagnosis not present

## 2024-06-18 DIAGNOSIS — Z6826 Body mass index (BMI) 26.0-26.9, adult: Secondary | ICD-10-CM | POA: Diagnosis not present

## 2024-06-18 DIAGNOSIS — I10 Essential (primary) hypertension: Secondary | ICD-10-CM | POA: Diagnosis not present

## 2024-06-18 DIAGNOSIS — G43909 Migraine, unspecified, not intractable, without status migrainosus: Secondary | ICD-10-CM | POA: Diagnosis not present

## 2024-06-18 DIAGNOSIS — E039 Hypothyroidism, unspecified: Secondary | ICD-10-CM | POA: Diagnosis not present

## 2024-06-25 DIAGNOSIS — E039 Hypothyroidism, unspecified: Secondary | ICD-10-CM | POA: Diagnosis not present

## 2024-06-25 DIAGNOSIS — E559 Vitamin D deficiency, unspecified: Secondary | ICD-10-CM | POA: Diagnosis not present

## 2024-06-25 DIAGNOSIS — R5383 Other fatigue: Secondary | ICD-10-CM | POA: Diagnosis not present

## 2024-06-25 DIAGNOSIS — D559 Anemia due to enzyme disorder, unspecified: Secondary | ICD-10-CM | POA: Diagnosis not present

## 2024-06-29 DIAGNOSIS — Z23 Encounter for immunization: Secondary | ICD-10-CM | POA: Diagnosis not present

## 2024-06-29 DIAGNOSIS — Z6827 Body mass index (BMI) 27.0-27.9, adult: Secondary | ICD-10-CM | POA: Diagnosis not present

## 2024-06-29 DIAGNOSIS — B079 Viral wart, unspecified: Secondary | ICD-10-CM | POA: Diagnosis not present

## 2024-06-29 DIAGNOSIS — E039 Hypothyroidism, unspecified: Secondary | ICD-10-CM | POA: Diagnosis not present

## 2024-06-29 DIAGNOSIS — I10 Essential (primary) hypertension: Secondary | ICD-10-CM | POA: Diagnosis not present

## 2024-06-29 DIAGNOSIS — G43909 Migraine, unspecified, not intractable, without status migrainosus: Secondary | ICD-10-CM | POA: Diagnosis not present

## 2024-06-29 DIAGNOSIS — K50918 Crohn's disease, unspecified, with other complication: Secondary | ICD-10-CM | POA: Diagnosis not present

## 2024-07-10 DIAGNOSIS — E039 Hypothyroidism, unspecified: Secondary | ICD-10-CM | POA: Diagnosis not present

## 2024-07-10 DIAGNOSIS — G43909 Migraine, unspecified, not intractable, without status migrainosus: Secondary | ICD-10-CM | POA: Diagnosis not present

## 2024-07-10 DIAGNOSIS — K50918 Crohn's disease, unspecified, with other complication: Secondary | ICD-10-CM | POA: Diagnosis not present

## 2024-07-23 DIAGNOSIS — K50919 Crohn's disease, unspecified, with unspecified complications: Secondary | ICD-10-CM | POA: Diagnosis not present

## 2024-07-23 DIAGNOSIS — E890 Postprocedural hypothyroidism: Secondary | ICD-10-CM | POA: Diagnosis not present

## 2024-07-24 DIAGNOSIS — K5090 Crohn's disease, unspecified, without complications: Secondary | ICD-10-CM | POA: Diagnosis not present

## 2024-07-24 DIAGNOSIS — I10 Essential (primary) hypertension: Secondary | ICD-10-CM | POA: Diagnosis not present

## 2024-07-24 DIAGNOSIS — N179 Acute kidney failure, unspecified: Secondary | ICD-10-CM | POA: Diagnosis not present

## 2024-07-30 DIAGNOSIS — G43909 Migraine, unspecified, not intractable, without status migrainosus: Secondary | ICD-10-CM | POA: Diagnosis not present

## 2024-07-30 DIAGNOSIS — K50918 Crohn's disease, unspecified, with other complication: Secondary | ICD-10-CM | POA: Diagnosis not present

## 2024-07-30 DIAGNOSIS — E039 Hypothyroidism, unspecified: Secondary | ICD-10-CM | POA: Diagnosis not present

## 2024-07-30 DIAGNOSIS — Z6827 Body mass index (BMI) 27.0-27.9, adult: Secondary | ICD-10-CM | POA: Diagnosis not present

## 2024-07-30 DIAGNOSIS — I10 Essential (primary) hypertension: Secondary | ICD-10-CM | POA: Diagnosis not present

## 2024-07-30 DIAGNOSIS — Z23 Encounter for immunization: Secondary | ICD-10-CM | POA: Diagnosis not present

## 2024-08-11 DIAGNOSIS — G43909 Migraine, unspecified, not intractable, without status migrainosus: Secondary | ICD-10-CM | POA: Diagnosis not present

## 2024-08-11 DIAGNOSIS — K50918 Crohn's disease, unspecified, with other complication: Secondary | ICD-10-CM | POA: Diagnosis not present

## 2024-08-11 DIAGNOSIS — E039 Hypothyroidism, unspecified: Secondary | ICD-10-CM | POA: Diagnosis not present

## 2024-08-18 DIAGNOSIS — G43909 Migraine, unspecified, not intractable, without status migrainosus: Secondary | ICD-10-CM | POA: Diagnosis not present

## 2024-08-18 DIAGNOSIS — Z6827 Body mass index (BMI) 27.0-27.9, adult: Secondary | ICD-10-CM | POA: Diagnosis not present

## 2024-08-18 DIAGNOSIS — E039 Hypothyroidism, unspecified: Secondary | ICD-10-CM | POA: Diagnosis not present

## 2024-08-27 DIAGNOSIS — K219 Gastro-esophageal reflux disease without esophagitis: Secondary | ICD-10-CM | POA: Diagnosis not present

## 2024-08-27 DIAGNOSIS — Z8249 Family history of ischemic heart disease and other diseases of the circulatory system: Secondary | ICD-10-CM | POA: Diagnosis not present

## 2024-08-27 DIAGNOSIS — K5090 Crohn's disease, unspecified, without complications: Secondary | ICD-10-CM | POA: Diagnosis not present

## 2024-08-27 DIAGNOSIS — Z833 Family history of diabetes mellitus: Secondary | ICD-10-CM | POA: Diagnosis not present

## 2024-08-27 DIAGNOSIS — G43909 Migraine, unspecified, not intractable, without status migrainosus: Secondary | ICD-10-CM | POA: Diagnosis not present

## 2024-08-27 DIAGNOSIS — I129 Hypertensive chronic kidney disease with stage 1 through stage 4 chronic kidney disease, or unspecified chronic kidney disease: Secondary | ICD-10-CM | POA: Diagnosis not present

## 2024-08-27 DIAGNOSIS — N1831 Chronic kidney disease, stage 3a: Secondary | ICD-10-CM | POA: Diagnosis not present

## 2024-08-27 DIAGNOSIS — F324 Major depressive disorder, single episode, in partial remission: Secondary | ICD-10-CM | POA: Diagnosis not present

## 2024-08-27 DIAGNOSIS — E876 Hypokalemia: Secondary | ICD-10-CM | POA: Diagnosis not present

## 2024-09-03 DIAGNOSIS — E039 Hypothyroidism, unspecified: Secondary | ICD-10-CM | POA: Diagnosis not present

## 2024-09-07 DIAGNOSIS — E890 Postprocedural hypothyroidism: Secondary | ICD-10-CM | POA: Diagnosis not present

## 2024-09-08 DIAGNOSIS — E559 Vitamin D deficiency, unspecified: Secondary | ICD-10-CM | POA: Diagnosis not present

## 2024-09-08 DIAGNOSIS — F410 Panic disorder [episodic paroxysmal anxiety] without agoraphobia: Secondary | ICD-10-CM | POA: Diagnosis not present

## 2024-09-08 DIAGNOSIS — I10 Essential (primary) hypertension: Secondary | ICD-10-CM | POA: Diagnosis not present

## 2024-09-08 DIAGNOSIS — K50918 Crohn's disease, unspecified, with other complication: Secondary | ICD-10-CM | POA: Diagnosis not present

## 2024-09-08 DIAGNOSIS — G43909 Migraine, unspecified, not intractable, without status migrainosus: Secondary | ICD-10-CM | POA: Diagnosis not present

## 2024-09-08 DIAGNOSIS — E039 Hypothyroidism, unspecified: Secondary | ICD-10-CM | POA: Diagnosis not present

## 2024-09-08 DIAGNOSIS — Z6828 Body mass index (BMI) 28.0-28.9, adult: Secondary | ICD-10-CM | POA: Diagnosis not present

## 2024-09-11 DIAGNOSIS — E039 Hypothyroidism, unspecified: Secondary | ICD-10-CM | POA: Diagnosis not present

## 2024-09-11 DIAGNOSIS — K50918 Crohn's disease, unspecified, with other complication: Secondary | ICD-10-CM | POA: Diagnosis not present

## 2024-09-11 DIAGNOSIS — G43909 Migraine, unspecified, not intractable, without status migrainosus: Secondary | ICD-10-CM | POA: Diagnosis not present

## 2024-10-09 DIAGNOSIS — E039 Hypothyroidism, unspecified: Secondary | ICD-10-CM | POA: Diagnosis not present

## 2024-10-09 DIAGNOSIS — K50918 Crohn's disease, unspecified, with other complication: Secondary | ICD-10-CM | POA: Diagnosis not present

## 2024-10-09 DIAGNOSIS — G43909 Migraine, unspecified, not intractable, without status migrainosus: Secondary | ICD-10-CM | POA: Diagnosis not present

## 2024-10-10 DIAGNOSIS — N390 Urinary tract infection, site not specified: Secondary | ICD-10-CM | POA: Diagnosis not present

## 2024-10-10 DIAGNOSIS — Z6828 Body mass index (BMI) 28.0-28.9, adult: Secondary | ICD-10-CM | POA: Diagnosis not present

## 2024-10-13 DIAGNOSIS — R002 Palpitations: Secondary | ICD-10-CM | POA: Diagnosis not present

## 2024-10-13 DIAGNOSIS — Z6828 Body mass index (BMI) 28.0-28.9, adult: Secondary | ICD-10-CM | POA: Diagnosis not present

## 2024-10-13 DIAGNOSIS — E039 Hypothyroidism, unspecified: Secondary | ICD-10-CM | POA: Diagnosis not present

## 2024-10-13 DIAGNOSIS — R300 Dysuria: Secondary | ICD-10-CM | POA: Diagnosis not present

## 2024-11-30 ENCOUNTER — Ambulatory Visit: Attending: Cardiology | Admitting: Cardiology
